# Patient Record
Sex: Female | Born: 1935 | Race: White | Hispanic: No | Smoking: Never smoker
Health system: Southern US, Community
[De-identification: ages and names within clinical notes are randomized; demographics above are authoritative.]

## PROBLEM LIST (undated history)

## (undated) DIAGNOSIS — R911 Solitary pulmonary nodule: Secondary | ICD-10-CM

## (undated) DIAGNOSIS — G479 Sleep disorder, unspecified: Secondary | ICD-10-CM

## (undated) DIAGNOSIS — C3432 Malignant neoplasm of lower lobe, left bronchus or lung: Secondary | ICD-10-CM

## (undated) DIAGNOSIS — K259 Gastric ulcer, unspecified as acute or chronic, without hemorrhage or perforation: Secondary | ICD-10-CM

## (undated) DIAGNOSIS — Z87442 Personal history of urinary calculi: Secondary | ICD-10-CM

## (undated) DIAGNOSIS — E7800 Pure hypercholesterolemia, unspecified: Secondary | ICD-10-CM

## (undated) DIAGNOSIS — I10 Essential (primary) hypertension: Secondary | ICD-10-CM

## (undated) DIAGNOSIS — D649 Anemia, unspecified: Secondary | ICD-10-CM

## (undated) DIAGNOSIS — K219 Gastro-esophageal reflux disease without esophagitis: Secondary | ICD-10-CM

## (undated) DIAGNOSIS — R5383 Other fatigue: Secondary | ICD-10-CM

## (undated) DIAGNOSIS — S3992XA Unspecified injury of lower back, initial encounter: Secondary | ICD-10-CM

## (undated) DIAGNOSIS — Z923 Personal history of irradiation: Secondary | ICD-10-CM

## (undated) DIAGNOSIS — D6481 Anemia due to antineoplastic chemotherapy: Secondary | ICD-10-CM

## (undated) DIAGNOSIS — C801 Malignant (primary) neoplasm, unspecified: Secondary | ICD-10-CM

## (undated) DIAGNOSIS — R200 Anesthesia of skin: Secondary | ICD-10-CM

## (undated) DIAGNOSIS — T451X5A Adverse effect of antineoplastic and immunosuppressive drugs, initial encounter: Secondary | ICD-10-CM

## (undated) DIAGNOSIS — I739 Peripheral vascular disease, unspecified: Secondary | ICD-10-CM

## (undated) DIAGNOSIS — Z85118 Personal history of other malignant neoplasm of bronchus and lung: Secondary | ICD-10-CM

## (undated) DIAGNOSIS — K5792 Diverticulitis of intestine, part unspecified, without perforation or abscess without bleeding: Secondary | ICD-10-CM

## (undated) DIAGNOSIS — M1990 Unspecified osteoarthritis, unspecified site: Secondary | ICD-10-CM

## (undated) DIAGNOSIS — E119 Type 2 diabetes mellitus without complications: Secondary | ICD-10-CM

## (undated) DIAGNOSIS — J90 Pleural effusion, not elsewhere classified: Secondary | ICD-10-CM

## (undated) HISTORY — DX: Pleural effusion, not elsewhere classified: J90

## (undated) HISTORY — DX: Malignant neoplasm of lower lobe, left bronchus or lung: C34.32

## (undated) HISTORY — DX: Personal history of irradiation: Z92.3

## (undated) HISTORY — PX: EYE SURGERY: SHX253

## (undated) HISTORY — DX: Anemia due to antineoplastic chemotherapy: D64.81

## (undated) HISTORY — PX: CHOLECYSTECTOMY: SHX55

## (undated) HISTORY — DX: Other fatigue: R53.83

## (undated) HISTORY — PX: BREAST BIOPSY: SHX20

## (undated) HISTORY — DX: Adverse effect of antineoplastic and immunosuppressive drugs, initial encounter: T45.1X5A

---
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
## 1970-09-12 HISTORY — PX: HERNIA REPAIR: SHX51

## 1977-09-12 HISTORY — PX: ABDOMINAL HYSTERECTOMY: SHX81

## 1995-09-13 HISTORY — PX: CARPAL TUNNEL RELEASE: SHX101

## 1996-09-12 DIAGNOSIS — S3992XA Unspecified injury of lower back, initial encounter: Secondary | ICD-10-CM

## 1996-09-12 HISTORY — DX: Unspecified injury of lower back, initial encounter: S39.92XA

## 2005-12-20 ENCOUNTER — Ambulatory Visit: Payer: Self-pay | Admitting: Internal Medicine

## 2006-03-01 ENCOUNTER — Ambulatory Visit: Payer: Self-pay | Admitting: Internal Medicine

## 2007-12-20 ENCOUNTER — Ambulatory Visit: Payer: Self-pay | Admitting: Family Medicine

## 2007-12-20 DIAGNOSIS — H6120 Impacted cerumen, unspecified ear: Secondary | ICD-10-CM

## 2010-04-20 ENCOUNTER — Encounter (INDEPENDENT_AMBULATORY_CARE_PROVIDER_SITE_OTHER): Payer: Self-pay | Admitting: *Deleted

## 2010-10-12 NOTE — Letter (Signed)
Summary: Nadara Eaton letter  [place] [facility]  [address] [place], [place] [address]   Phone: [phone]  Fax: [phone]       04/20/2010 MRN: [id number]  YVETTE LOVELESS [address] [place], [place]  [address]  Dear Ms. [facility] [place] and [place] announce the retirement of Arta Silence, M.D., from full-time practice at the [facility] office effective March 11, 2010 and his plans of returning part-time.  It is important to Dr. Hetty Ely and to our practice that you understand that [facility] has seven physicians in our office for your health care needs.  We will continue to offer the same exceptional care that you have today.    Dr. Hetty Ely has spoken to many of you about his plans for retirement and returning part-time in the fall.   We will continue to work with you through the transition to schedule appointments for you in the office and meet the high standards that [place] is committed to.   Again, it is with great pleasure that we share the news that Dr. Hetty Ely will return to [facility] in October of 2011 with a reduced schedule.    If you have any questions, or would like to request an appointment with one of our physicians, please call us at [phone] and press the option for Scheduling an appointment.  We take pleasure in providing you with excellent patient care and look forward to seeing you at your next office visit.  Our [facility] Physicians are:  Tillman Abide, M.D. Laurita Quint, M.D. Roxy Manns, M.D. Kerby Nora, M.D. Hannah Beat, M.D. Ruthe Mannan, M.D. We proudly welcomed Raechel Ache, M.D. and Eustaquio Boyden, M.D. to the practice in July/August 2011.  Sincerely,  [facility]

## 2012-09-12 DIAGNOSIS — C801 Malignant (primary) neoplasm, unspecified: Secondary | ICD-10-CM

## 2012-09-12 DIAGNOSIS — Z85118 Personal history of other malignant neoplasm of bronchus and lung: Secondary | ICD-10-CM

## 2012-09-12 HISTORY — DX: Malignant (primary) neoplasm, unspecified: C80.1

## 2012-09-12 HISTORY — DX: Personal history of other malignant neoplasm of bronchus and lung: Z85.118

## 2012-09-17 ENCOUNTER — Other Ambulatory Visit: Payer: Self-pay | Admitting: Orthopedic Surgery

## 2012-09-17 MED ORDER — BUPIVACAINE LIPOSOME 1.3 % IJ SUSP: 20.0000 mL | Freq: Once | INTRAMUSCULAR | Status: DC

## 2012-09-17 MED ORDER — DEXAMETHASONE SODIUM PHOSPHATE 10 MG/ML IJ SOLN: 10.0000 mg | Freq: Once | INTRAMUSCULAR | Status: DC

## 2012-09-17 NOTE — Progress Notes (Signed)
Preoperative surgical orders have been place into the Epic hospital system for Stephanie Fletcher on 09/17/2012, 11:14 AM  by Patrica Duel for surgery on 11/05/2012.  Preop Total Knee orders including Experal, IV Tylenol, and IV Decadron as long as there are no contraindications to the above medications. Avel Peace, PA-C

## 2012-10-23 ENCOUNTER — Encounter (HOSPITAL_COMMUNITY): Payer: Self-pay | Admitting: Pharmacy Technician

## 2012-10-23 ENCOUNTER — Other Ambulatory Visit: Payer: Self-pay | Admitting: Orthopedic Surgery
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
## 2012-10-29 ENCOUNTER — Encounter (HOSPITAL_COMMUNITY): Payer: Self-pay

## 2012-10-29 ENCOUNTER — Ambulatory Visit (HOSPITAL_COMMUNITY)
Admission: RE | Admit: 2012-10-29 | Discharge: 2012-10-29 | Disposition: A | Discharge status: Home / Self Care | Payer: Medicare Other | Source: Ambulatory Visit | Attending: Orthopedic Surgery | Admitting: Orthopedic Surgery

## 2012-10-29 ENCOUNTER — Encounter (HOSPITAL_COMMUNITY)
Admission: RE | Admit: 2012-10-29 | Discharge: 2012-10-29 | Disposition: A | Discharge status: Home / Self Care | Payer: Medicare Other | Source: Ambulatory Visit | Attending: Orthopedic Surgery | Admitting: Orthopedic Surgery

## 2012-10-29 DIAGNOSIS — Z01812 Encounter for preprocedural laboratory examination: Secondary | ICD-10-CM | POA: Insufficient documentation

## 2012-10-29 DIAGNOSIS — J984 Other disorders of lung: Secondary | ICD-10-CM | POA: Insufficient documentation

## 2012-10-29 DIAGNOSIS — Z01818 Encounter for other preprocedural examination: Secondary | ICD-10-CM | POA: Insufficient documentation

## 2012-10-29 DIAGNOSIS — K449 Diaphragmatic hernia without obstruction or gangrene: Secondary | ICD-10-CM | POA: Insufficient documentation

## 2012-10-29 HISTORY — DX: Unspecified injury of lower back, initial encounter: S39.92XA

## 2012-10-29 HISTORY — DX: Type 2 diabetes mellitus without complications: E11.9

## 2012-10-29 HISTORY — DX: Diverticulitis of intestine, part unspecified, without perforation or abscess without bleeding: K57.92

## 2012-10-29 HISTORY — DX: Anemia, unspecified: D64.9

## 2012-10-29 HISTORY — DX: Pure hypercholesterolemia, unspecified: E78.00

## 2012-10-29 HISTORY — DX: Unspecified osteoarthritis, unspecified site: M19.90

## 2012-10-29 HISTORY — DX: Essential (primary) hypertension: I10

## 2012-10-29 HISTORY — DX: Gastric ulcer, unspecified as acute or chronic, without hemorrhage or perforation: K25.9

## 2012-10-29 LAB — CBC
HCT: 36.7 % (ref 36.0–46.0)
MCH: 29.1 pg (ref 26.0–34.0)
MCHC: 31.9 g/dL (ref 30.0–36.0)
MCV: 91.3 fL (ref 78.0–100.0)
RDW: 12.4 % (ref 11.5–15.5)

## 2012-10-29 LAB — COMPREHENSIVE METABOLIC PANEL
Albumin: 3.3 g/dL — ABNORMAL LOW (ref 3.5–5.2)
BUN: 19 mg/dL (ref 6–23)
Chloride: 103 mEq/L (ref 96–112)
Creatinine, Ser: 0.88 mg/dL (ref 0.50–1.10)
Total Bilirubin: 0.1 mg/dL — ABNORMAL LOW (ref 0.3–1.2)

## 2012-10-29 LAB — URINALYSIS, ROUTINE W REFLEX MICROSCOPIC
Bilirubin Urine: NEGATIVE
Ketones, ur: NEGATIVE mg/dL
Nitrite: NEGATIVE
Specific Gravity, Urine: 1.016 (ref 1.005–1.030)
Urobilinogen, UA: 0.2 mg/dL (ref 0.0–1.0)

## 2012-10-29 LAB — SURGICAL PCR SCREEN: MRSA, PCR: NEGATIVE

## 2012-10-29 LAB — PROTIME-INR
INR: 0.95 (ref 0.00–1.49)
Prothrombin Time: 12.6 seconds (ref 11.6–15.2)

## 2012-10-29 NOTE — Progress Notes (Signed)
Abnormal CXR routed to Dr. Lequita Halt via [facility]

## 2012-10-29 NOTE — Patient Instructions (Signed)
20 Haelyn Forgey  10/29/2012   Your procedure is scheduled on: 11/05/12  Report to [facility] at 0730 AM.  Call this number if you have problems the morning of surgery [phone]: [phone]   Remember:   Do not eat food or drink liquids After Midnight.     Take these medicines the morning of surgery with A SIP OF WATER: prilosec   Do not wear jewelry, make-up or nail polish.  Do not wear lotions, powders, or perfumes. You may wear deodorant.  Do not shave 48 hours prior to surgery. Men may shave face and neck.  Do not bring valuables to the hospital.  Contacts, dentures or bridgework may not be worn into surgery.  Leave suitcase in the car. After surgery it may be brought to your room.  For patients admitted to the hospital, checkout time is 11:00 AM the day of discharge.     Please read over the following fact sheets that you were given: MRSA Information.  Birdie Sons, RN  pre op nurse call if needed [phone]    FAILURE TO FOLLOW THESE INSTRUCTIONS MAY RESULT IN CANCELLATION OF YOUR SURGERY   Patient Signature: ___________________________________________

## 2012-10-29 NOTE — Progress Notes (Signed)
EKG 09/07/12 on chart, office visit note Dr. Hyacinth Meeker 09/07/12 on chart, surgery clearance note Dr. Hyacinth Meeker on chart

## 2012-10-30 ENCOUNTER — Other Ambulatory Visit: Payer: Self-pay | Admitting: Orthopedic Surgery

## 2012-11-01 ENCOUNTER — Ambulatory Visit
Admission: RE | Admit: 2012-11-01 | Discharge: 2012-11-01 | Disposition: A | Discharge status: Home / Self Care | Payer: Medicare Other | Source: Ambulatory Visit | Attending: Orthopedic Surgery | Admitting: Orthopedic Surgery

## 2012-11-05 ENCOUNTER — Encounter (HOSPITAL_COMMUNITY): Admission: RE | Payer: Self-pay | Source: Ambulatory Visit

## 2012-11-05 ENCOUNTER — Inpatient Hospital Stay (HOSPITAL_COMMUNITY): Admission: RE | Admit: 2012-11-05 | Payer: Medicare Other | Source: Ambulatory Visit | Admitting: Orthopedic Surgery

## 2012-11-05 LAB — TYPE AND SCREEN: Antibody Screen: NEGATIVE

## 2012-11-05 SURGERY — ARTHROPLASTY, KNEE, TOTAL
Anesthesia: Choice | Site: Knee | Laterality: Left

## 2012-11-07 ENCOUNTER — Other Ambulatory Visit (HOSPITAL_COMMUNITY): Payer: Self-pay | Admitting: Orthopedic Surgery

## 2012-11-07 DIAGNOSIS — R918 Other nonspecific abnormal finding of lung field: Secondary | ICD-10-CM

## 2012-11-09 ENCOUNTER — Ambulatory Visit (HOSPITAL_COMMUNITY)
Admission: RE | Admit: 2012-11-09 | Discharge: 2012-11-09 | Disposition: A | Discharge status: Home / Self Care | Payer: Medicare Other | Source: Ambulatory Visit | Attending: Orthopedic Surgery | Admitting: Orthopedic Surgery

## 2012-11-09 DIAGNOSIS — R222 Localized swelling, mass and lump, trunk: Secondary | ICD-10-CM | POA: Insufficient documentation

## 2012-11-09 LAB — PULMONARY FUNCTION TEST

## 2012-11-09 MED ORDER — ALBUTEROL SULFATE (5 MG/ML) 0.5% IN NEBU
2.5000 mg | INHALATION_SOLUTION | Freq: Once | RESPIRATORY_TRACT | Status: AC
  Administered 2012-11-09: 2.5 mg via RESPIRATORY_TRACT

## 2012-11-13 ENCOUNTER — Other Ambulatory Visit: Payer: Self-pay

## 2012-11-13 ENCOUNTER — Ambulatory Visit (INDEPENDENT_AMBULATORY_CARE_PROVIDER_SITE_OTHER): Payer: Medicare Other | Admitting: Thoracic Surgery (Cardiothoracic Vascular Surgery)

## 2012-11-13 ENCOUNTER — Encounter: Payer: Self-pay | Admitting: Thoracic Surgery (Cardiothoracic Vascular Surgery)

## 2012-11-13 ENCOUNTER — Ambulatory Visit (HOSPITAL_COMMUNITY)
Admission: RE | Admit: 2012-11-13 | Discharge: 2012-11-13 | Disposition: A | Discharge status: Home / Self Care | Payer: Medicare Other | Source: Ambulatory Visit | Attending: Orthopedic Surgery | Admitting: Orthopedic Surgery

## 2012-11-13 VITALS — BP 157/68 | HR 69 | Resp 16 | Ht 66.0 in | Wt 143.0 lb

## 2012-11-13 DIAGNOSIS — K449 Diaphragmatic hernia without obstruction or gangrene: Secondary | ICD-10-CM | POA: Insufficient documentation

## 2012-11-13 DIAGNOSIS — R222 Localized swelling, mass and lump, trunk: Secondary | ICD-10-CM | POA: Insufficient documentation

## 2012-11-13 DIAGNOSIS — D3500 Benign neoplasm of unspecified adrenal gland: Secondary | ICD-10-CM | POA: Insufficient documentation

## 2012-11-13 LAB — GLUCOSE, CAPILLARY: Glucose-Capillary: 146 mg/dL — ABNORMAL HIGH (ref 70–99)

## 2012-11-13 MED ORDER — FLUDEOXYGLUCOSE F - 18 (FDG) INJECTION
15.4000 | Freq: Once | INTRAVENOUS | Status: AC | PRN
  Administered 2012-11-13: 15.4 via INTRAVENOUS

## 2012-11-13 NOTE — Progress Notes (Signed)
PCP is Yetta Flock, MD Referring Provider is Aluisio, Gus Rankin, MD  Chief Complaint  Patient presents with  . Lung Mass    LLLobe...eval and treat...CT CHEST, PET, PFT'S    HPI: Stephanie Fletcher is an 79 year old woman who presented with a chief complaint of a lung mass.  Stephanie Fletcher is an 62 year old lady who was being evaluated for a total knee replacement. As part of her preoperative evaluation she had a chest x-ray which showed a left lower lobe mass. This led to a CT of the chest which showed a left lower lobe mass with no mediastinal or hilar adenopathy. Today she had a PET/CT which showed the lesion to be hypermetabolic, albeit with an SUV of 4.9. There was no hilar or mediastinal adenopathy. She does have bilateral adrenal lesions consistent with adenomas.  She is a lifelong nonsmoker, although she did have exposure to secondhand smoke from her husband. She has no other exposures to speak of. Interestingly her husband had nocardia in 2006. She has no TB or fungal exposure. She does not keep birds and has not traveled recently. 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 She denies any recent fevers chills or sweats. Her most recent chest x-ray is about 15 years ago in [place].   Past Medical History  Diagnosis Date  . Measles   . Mumps   . Diabetes mellitus without complication     type 2  . Hypercholesterolemia   . Hypertension   . Diverticulitis   . Gastric ulcer   . Arthritis   . Anemia   . Back injury 1998    T-12 fracture (brace)    Past Surgical History  Procedure Laterality Date  . Abdominal hysterectomy  1979  . Hernia repair Right 1972  . Eye surgery Bilateral 2007, 2008    cataracts  . Appendectomy  1949  . Breast biopsy Bilateral [id number]  . Carpal tunnel release Bilateral 1997  . Skin graft  1955    right groin  . Cholecystectomy      No family history on file. Family history  Father are disease high blood  pressure, mother high blood pressure and stroke, brother high cholesterol and high blood pressure  Social History History  Substance Use Topics  . Smoking status: Never Smoker   . Smokeless tobacco: Never Used  . Alcohol Use: No    Current Outpatient Prescriptions  Medication Sig Dispense Refill  . Ascorbic Acid (VITAMIN C) 1000 MG tablet Take 1,000 mg by mouth daily.      [name] aspirin EC 81 MG tablet Take 81 mg by mouth at bedtime.      . Calcium Carbonate (CALCIUM 600 PO) Take 600 mg by mouth 2 (two) times daily.      . cholecalciferol (VITAMIN D) 1000 UNITS tablet Take 1,000 Units by mouth daily.      . cyanocobalamin (,VITAMIN B-12,) 1000 MCG/ML injection Inject 1,000 mcg into the muscle every 30 (thirty) days.      [name] dicyclomine (BENTYL) 20 MG tablet Take 20 mg by mouth every 6 (six) hours as needed (for stomach).      . Ferrous Sulfate (FEROSUL PO) Take 220 mg by mouth daily.      . fish oil-omega-3 fatty acids 1000 MG capsule Take 1 g by mouth daily.      . metFORMIN (GLUCOPHAGE) 500 MG tablet Take 1,000 mg by mouth 2 (two) times daily with a meal.      .  Multiple Vitamin (MULTIVITAMIN WITH MINERALS) TABS Take 1 tablet by mouth daily.      . Multiple Vitamins-Minerals (PRESERVISION AREDS 2 PO) Take 2 mg by mouth 2 (two) times daily.      [name] omeprazole (PRILOSEC) 20 MG capsule Take 20 mg by mouth 2 (two) times daily.      . pioglitazone (ACTOS) 15 MG tablet Take 15 mg by mouth every morning.       . polyethylene glycol (MIRALAX / GLYCOLAX) packet Take 17 g by mouth daily as needed (constipation).      . pravastatin (PRAVACHOL) 20 MG tablet Take 20 mg by mouth at bedtime.      . sitaGLIPtin (JANUVIA) 100 MG tablet Take 100 mg by mouth daily.      . valsartan-hydrochlorothiazide (DIOVAN HCT) 160-25 MG per tablet Take 1 tablet by mouth every morning.      . zolpidem (AMBIEN) 5 MG tablet Take 5 mg by mouth at bedtime as needed for sleep.       No current facility-administered medications  for this visit.    Allergies  Allergen Reactions  . Fosamax (Alendronate)     Review of Systems  Constitutional: Positive for activity change (Limited due to decreased energy and knee problems), fatigue and unexpected weight change (Lost 7 pounds over past 6). Negative for fever and chills.  Eyes: Positive for visual disturbance (Eyesight is worsened recently).  Respiratory: Negative for cough, chest tightness, shortness of breath and wheezing.   Cardiovascular: Negative for chest pain.  Musculoskeletal: Positive for joint swelling and arthralgias (knee pain, being evaluated for total knee replacement).  Neurological:       Neuropathy in legs  All other systems reviewed and are negative.    BP 157/68  Pulse 69  Resp 16  Ht 5\' 6"  (1.676 m)  Wt 143 lb (64.864 kg)  BMI 23.09 kg/m2  SpO2 97% Physical Exam  Vitals reviewed. Constitutional: She is oriented to person, place, and time. She appears well-developed and well-nourished. No distress.  Elderly  HENT:  Head: Normocephalic and atraumatic.  Eyes: EOM are normal. Pupils are equal, round, and reactive to light.  Wearing glasses  Neck: Neck supple. No thyromegaly present.  Cardiovascular: Normal rate, regular rhythm and normal heart sounds.   No murmur heard. Pulmonary/Chest: Effort normal and breath sounds normal. She has no wheezes. She has no rales.  Abdominal: Soft. There is no tenderness.  Musculoskeletal: She exhibits no edema.  Lymphadenopathy:    She has no cervical adenopathy.  Neurological: She is alert and oriented to person, place, and time. No cranial nerve deficit.  Skin: Skin is warm and dry.  Psychiatric: She has a normal mood and affect.     Diagnostic Tests: CT of chest 11/01/12 CT CHEST WITHOUT CONTRAST  Technique: Multidetector CT imaging of the chest was performed  following the standard protocol without IV contrast.  Comparison: Chest x-ray 10/29/2012.  Findings:  Mediastinum: Heart size is  upper limits of normal. There is no  significant pericardial fluid, thickening or pericardial  calcification. There is atherosclerosis of the thoracic aorta, the  great vessels of the mediastinum and the coronary arteries,  including calcified atherosclerotic plaque in the left main, left  anterior descending and left circumflex coronary arteries. No  pathologically enlarged mediastinal or hilar lymph nodes. Please  note that accurate exclusion of hilar adenopathy is limited on  noncontrast CT scans. Large hiatal hernia.  Lungs/Pleura: There is a large pleural-based mass in the inferior  aspect of the left lower lobe which measures approximately 5.2 x  2.9 cm (image 36 of series 4). There is a small amount of ground-  glass attenuation adjacent to this lesion in the periphery of the  left lower lobe, likely reflects some mild postobstructive changes.  One tiny calcification is noted along the anterior aspect of this  lesion on image 35 of series 3, however, this is not a specific  finding. 3 mm nodule in the left lower lobe (image 27 of series  4). No pleural effusions. No acute consolidative airspace disease.  Upper Abdomen: 1.6 cm low attenuation lesion extending  exophytically from the upper pole of the left kidney and 1.1 cm low  attenuation lesion in the anterior aspect of the lower pole left  kidney are incompletely characterized on this noncontrast CT  examination. 1-2 mm calcifications in the upper pole collecting  system of the left kidney may be nonobstructive calculi and/or  vascular calcifications. Extensive atherosclerosis is noted  throughout the upper abdominal vasculature. Status post  cholecystectomy.  Musculoskeletal: There are no aggressive appearing lytic or blastic  lesions noted in the visualized portions of the skeleton.  Compression fracture of L1 with approximately 50% loss of anterior  vertebral body height and 10% loss of posterior vertebral body  height with  mild buckling of the posterior cortex.  IMPRESSION:  1. 5.2 x 2.9 cm left lower lobe mass abutting the left major  fissure. There is a 3 mm nodule in the left lower lobe as well  which is nonspecific. No definite lymphadenopathy in the  mediastinum or hilum on this limited noncontrast CT examination.  This is highly suspicious for a primary bronchogenic neoplasm, and  correlation with PET CT and/or biopsy is highly recommended.  2. Atherosclerosis, including left main and two-vessel coronary  artery disease.  3. Large hiatal hernia.  4. Additional incidental findings, as above.   PET/CT 11/13/12 *RADIOLOGY REPORT*  Clinical Data: Initial treatment strategy for lung mass.  NUCLEAR MEDICINE PET SKULL BASE TO THIGH  Fasting Blood Glucose: 146  Technique: 15.4 mCi F-18 FDG was injected intravenously. CT data  was obtained and used for attenuation correction and anatomic  localization only. (This was not acquired as a diagnostic CT  examination.) Additional exam technical data entered on  technologist worksheet.  Comparison: CT chest dated 11/01/2012  Findings:  Neck: No hypermetabolic lymph nodes in the neck.  Chest: Evaluation is constrained by respiratory motion.  Mass in the left lower lobe along the fissure measures at least 3.9  x 2.8 cm (series 2/image 99), with max SUV 4.9 (PET image at 88),  compatible with primary bronchogenic neoplasm.  Additional 3 mm left lower lobe pulmonary nodule noted on prior CT  is not evident on the current study. Regardless, this would be  below the threshold for PET sensitivity.  No hypermetabolic mediastinal or hilar nodes.  Abdomen/Pelvis: No abnormal hypermetabolic activity within the  liver, pancreas, or spleen.  1.4 x 1.4 cm right adrenal nodule (series 2/image 109) and 1.9 x  1.1 cm left renal nodule (series 2/image 111), measuring less than  10 HUs on unenhanced CT and without corresponding hypermetabolism  on PET, reflecting adrenal  adenomas.  No hypermetabolic lymph nodes in the abdomen or pelvis.  Moderate hiatal hernia.  Scattered areas of GI uptake without corresponding suspicious focal  abnormality on CT.  Skeleton: No focal hypermetabolic activity to suggest skeletal  metastasis.  IMPRESSION:  Left lower lobe mass measuring  at least 3.9 cm, max SUV 4.9,  compatible with primary bronchogenic neoplasm.  Bilateral adrenal adenomas, as described above.  No evidence of metastatic disease.   Impression: Stephanie Fletcher is an 62 year old woman who is being evaluated for elective total knee replacement. During her preoperative evaluation she was found to have a left lower lobe mass. This was confirmed by CT scan and was a 5.2 x 2.9 cm mass in the left lower lobe abutting the major fissure. She then had a PET/CT today which showed the lesion to be hypermetabolic with a maximum SUV of 4.9.  She does not have any recent past x-rays for comparison. Interestingly the lesion measured a little smaller on the PET CT than it did on the CT 3 weeks ago. Another interesting finding is that her husband had a Nocardia infection in 2006. The lesion is hypermetabolic, although the SUV is not particularly high given the size of the lesion. I cannot rule out that this is a primary bronchogenic carcinoma. However I do think it's within the relative possibility that this represents an infectious or inflammatory mass. Given that possibility I think the best initial approach would be to proceed with electromagnetic navigational bronchoscopy system we can obtain biopsies and specimens for cultures. I discussed this in detail with the patient as well as her sister-in-law who accompanied her. We talked about the need for general anesthesia, the general nature of the procedure, and possible outcomes. They do understand there is a possibility of false negative findings with this procedure. We will plan to do it on an outpatient basis. They understand the  procedure will be done with a flexible bronchoscope via endotracheal tube. We discussed the risks of the procedure which include those related general anesthesia including death, MI, stroke, blood clots, as well as the risks of a false negative or nondiagnostic study, bleeding and pneumothorax. They do understand that additional diagnostic or therapeutic procedures may be needed.  She understands and accepts the risk and wishes to proceed   Plan: Bronchoscopy/electromagnetic navigational bronchoscopy for biopsies and cultures on Thursday, March 13.

## 2012-11-15 ENCOUNTER — Encounter (HOSPITAL_COMMUNITY): Payer: Self-pay

## 2012-11-20 ENCOUNTER — Encounter (HOSPITAL_COMMUNITY)
Admission: RE | Admit: 2012-11-20 | Discharge: 2012-11-20 | Disposition: A | Discharge status: Home / Self Care | Payer: Medicare Other | Source: Ambulatory Visit | Attending: Thoracic Surgery (Cardiothoracic Vascular Surgery) | Admitting: Thoracic Surgery (Cardiothoracic Vascular Surgery)

## 2012-11-20 ENCOUNTER — Encounter (HOSPITAL_COMMUNITY): Payer: Self-pay

## 2012-11-20 VITALS — BP 157/75 | HR 74 | Temp 98.1°F | Resp 20 | Ht 66.0 in | Wt 147.3 lb

## 2012-11-20 HISTORY — DX: Solitary pulmonary nodule: R91.1

## 2012-11-20 HISTORY — DX: Gastro-esophageal reflux disease without esophagitis: K21.9

## 2012-11-20 LAB — COMPREHENSIVE METABOLIC PANEL
Alkaline Phosphatase: 57 U/L (ref 39–117)
BUN: 16 mg/dL (ref 6–23)
CO2: 27 mEq/L (ref 19–32)
Chloride: 102 mEq/L (ref 96–112)
GFR calc Af Amer: 90 mL/min — ABNORMAL LOW (ref >90)
Glucose, Bld: 202 mg/dL — ABNORMAL HIGH (ref 70–99)
Potassium: 4.3 mEq/L (ref 3.5–5.1)
Total Bilirubin: 0.2 mg/dL — ABNORMAL LOW (ref 0.3–1.2)

## 2012-11-20 LAB — CBC
HCT: 36.2 % (ref 36.0–46.0)
Hemoglobin: 11.7 g/dL — ABNORMAL LOW (ref 12.0–15.0)
RBC: 4 MIL/uL (ref 3.87–5.11)
WBC: 6.2 10*3/uL (ref 4.0–10.5)

## 2012-11-20 LAB — APTT: aPTT: 33 seconds (ref 24–37)

## 2012-11-20 LAB — PROTIME-INR: Prothrombin Time: 12.9 seconds (ref 11.6–15.2)

## 2012-11-20 NOTE — Pre-Procedure Instructions (Signed)
Stephanie Fletcher  11/20/2012   Your procedure is scheduled on:  Thursday March 13  Report to [facility] at 0530 AM.  Call this number if you have problems the morning of surgery: [phone]   Remember:   Do not eat food or drink liquids after midnight. Wednesday night   Take these medicines the morning of surgery with A SIP OF WATER: Prilosec    Do not wear jewelry, make-up or nail polish.  Do not wear lotions, powders, or perfumes or  deodorant.  Do not shave your legs or underarms 48 hours prior to surgery.               Do not bring valuables to the hospital.  Contacts, dentures or bridgework may not be worn into surgery.     Patients discharged the day of surgery will not be allowed to drive  home.  Name and phone number of your driver:   Special Instructions: Shower using CHG 2 nights before surgery and the night before surgery.  If you shower the day of surgery use CHG.  Use special wash - you have one bottle of CHG for all showers.  You should use approximately 1/3 of the bottle for each shower.   Please read over the following fact sheets that you were given: Pain Booklet, Coughing and Deep Breathing and Surgical Site Infection Prevention

## 2012-11-21 MED ORDER — CEFAZOLIN SODIUM-DEXTROSE 2-3 GM-% IV SOLR
2.0000 g | INTRAVENOUS | Status: AC
  Administered 2012-11-22: 2 g via INTRAVENOUS
  Filled 2012-11-21: qty 50

## 2012-11-21 MED ORDER — SODIUM CHLORIDE 0.9 % IV SOLN: INTRAVENOUS | Status: DC

## 2012-11-21 MED ORDER — ACETAMINOPHEN 10 MG/ML IV SOLN
1000.0000 mg | Freq: Once | INTRAVENOUS | Status: DC
  Filled 2012-11-21: qty 100

## 2012-11-21 NOTE — Progress Notes (Signed)
Spoke with Steward Drone at [place] [facility] in [place]. She will fax most recent copy of EKG to [phone].

## 2012-11-21 NOTE — Progress Notes (Signed)
Anesthesia Chart Review:  Patient is a 79 year old female scheduled for bronchoscopy, ENB by Dr. Dorris Fetch on 11/22/12 for further evaluation of a LLL lung mass that was found incidentally during her pre-operative evaluation for a left TKA.  Other history includes DM2, HTN,  hypercholesterolemia, diverticulitis, gastric ulcer, arthritis, anemia, T12 fracture due to injury '98, measles, mumps, GERD.  PCP is Dr. Silver Huguenin at [facility]).  EKG from [facility] on 08/28/12 showed SR, PVCs, cannot rule out inferior infarct, right BBB.  This was reviewed by Dr. Hyacinth Meeker during patient's pre-operative clearance visit.  Dr. Hyacinth Meeker did not feel the EKG was changed from her prior EKG, and patient was ultimately cleared for left TKA.   Preoperative labs and CXR noted.  Patient's EKG was previously reviewed by her PCP and felt stable. If no acute CV symptoms then would anticipate she could proceed as planned.  She will be evaluated by her assigned anesthesiologist on the day of surgery.  Velna Ochs [facility]/Anesthesiology Phone [phone] 11/21/2012 1:42 PM

## 2012-11-21 NOTE — Progress Notes (Signed)
EKG received from [place] [facility].  Right Bundle Branch Block noted on EKG.  Revonda Standard, PA notified.  States she will check the chart.

## 2012-11-22 ENCOUNTER — Ambulatory Visit (HOSPITAL_COMMUNITY): Payer: Medicare Other | Admitting: Certified Registered Nurse Anesthetist

## 2012-11-22 ENCOUNTER — Ambulatory Visit (HOSPITAL_COMMUNITY): Payer: Medicare Other

## 2012-11-22 ENCOUNTER — Encounter (HOSPITAL_COMMUNITY): Payer: Self-pay | Admitting: Vascular Surgery

## 2012-11-22 ENCOUNTER — Ambulatory Visit (HOSPITAL_COMMUNITY)
Admission: RE | Admit: 2012-11-22 | Discharge: 2012-11-22 | Disposition: A | Discharge status: Home / Self Care | Payer: Medicare Other | Source: Ambulatory Visit | Attending: Thoracic Surgery (Cardiothoracic Vascular Surgery) | Admitting: Thoracic Surgery (Cardiothoracic Vascular Surgery)

## 2012-11-22 ENCOUNTER — Encounter (HOSPITAL_COMMUNITY): Payer: Self-pay | Admitting: Anesthesiology

## 2012-11-22 ENCOUNTER — Encounter (HOSPITAL_COMMUNITY)
Admission: RE | Disposition: A | Discharge status: Home / Self Care | Payer: Self-pay | Source: Ambulatory Visit | Attending: Thoracic Surgery (Cardiothoracic Vascular Surgery)

## 2012-11-22 DIAGNOSIS — D649 Anemia, unspecified: Secondary | ICD-10-CM | POA: Insufficient documentation

## 2012-11-22 DIAGNOSIS — Z888 Allergy status to other drugs, medicaments and biological substances status: Secondary | ICD-10-CM | POA: Insufficient documentation

## 2012-11-22 DIAGNOSIS — Z79899 Other long term (current) drug therapy: Secondary | ICD-10-CM | POA: Insufficient documentation

## 2012-11-22 DIAGNOSIS — M129 Arthropathy, unspecified: Secondary | ICD-10-CM | POA: Insufficient documentation

## 2012-11-22 DIAGNOSIS — E7800 Pure hypercholesterolemia, unspecified: Secondary | ICD-10-CM | POA: Insufficient documentation

## 2012-11-22 DIAGNOSIS — I10 Essential (primary) hypertension: Secondary | ICD-10-CM | POA: Insufficient documentation

## 2012-11-22 DIAGNOSIS — C3430 Malignant neoplasm of lower lobe, unspecified bronchus or lung: Secondary | ICD-10-CM | POA: Insufficient documentation

## 2012-11-22 DIAGNOSIS — D381 Neoplasm of uncertain behavior of trachea, bronchus and lung: Secondary | ICD-10-CM

## 2012-11-22 DIAGNOSIS — E119 Type 2 diabetes mellitus without complications: Secondary | ICD-10-CM | POA: Insufficient documentation

## 2012-11-22 DIAGNOSIS — D3500 Benign neoplasm of unspecified adrenal gland: Secondary | ICD-10-CM | POA: Insufficient documentation

## 2012-11-22 DIAGNOSIS — Z8711 Personal history of peptic ulcer disease: Secondary | ICD-10-CM | POA: Insufficient documentation

## 2012-11-22 DIAGNOSIS — Z7982 Long term (current) use of aspirin: Secondary | ICD-10-CM | POA: Insufficient documentation

## 2012-11-22 DIAGNOSIS — G609 Hereditary and idiopathic neuropathy, unspecified: Secondary | ICD-10-CM | POA: Insufficient documentation

## 2012-11-22 HISTORY — PX: VIDEO BRONCHOSCOPY WITH ENDOBRONCHIAL NAVIGATION: SHX6175

## 2012-11-22 LAB — GLUCOSE, CAPILLARY: Glucose-Capillary: 120 mg/dL — ABNORMAL HIGH (ref 70–99)

## 2012-11-22 SURGERY — VIDEO BRONCHOSCOPY WITH ENDOBRONCHIAL NAVIGATION
Anesthesia: General | Site: Bronchus | Wound class: Clean Contaminated

## 2012-11-22 MED ORDER — ROCURONIUM BROMIDE 100 MG/10ML IV SOLN
INTRAVENOUS | Status: DC | PRN
  Administered 2012-11-22: 10 mg via INTRAVENOUS
  Administered 2012-11-22: 20 mg via INTRAVENOUS

## 2012-11-22 MED ORDER — LIDOCAINE HCL (CARDIAC) 20 MG/ML IV SOLN
INTRAVENOUS | Status: DC | PRN
  Administered 2012-11-22: 80 mg via INTRAVENOUS

## 2012-11-22 MED ORDER — PHENYLEPHRINE HCL 10 MG/ML IJ SOLN
10.0000 mg | INTRAMUSCULAR | Status: DC | PRN
  Administered 2012-11-22: 50 ug/min via INTRAVENOUS

## 2012-11-22 MED ORDER — ONDANSETRON HCL 4 MG/2ML IJ SOLN
INTRAMUSCULAR | Status: DC | PRN
  Administered 2012-11-22: 4 mg via INTRAVENOUS

## 2012-11-22 MED ORDER — PHENYLEPHRINE HCL 10 MG/ML IJ SOLN
INTRAMUSCULAR | Status: DC | PRN
  Administered 2012-11-22: 80 ug via INTRAVENOUS
  Administered 2012-11-22: 40 ug via INTRAVENOUS

## 2012-11-22 MED ORDER — 0.9 % SODIUM CHLORIDE (POUR BTL) OPTIME
TOPICAL | Status: DC | PRN
  Administered 2012-11-22: 500 mL

## 2012-11-22 MED ORDER — LIDOCAINE HCL 4 % MT SOLN
OROMUCOSAL | Status: DC | PRN
  Administered 2012-11-22: 4 mL via TOPICAL

## 2012-11-22 MED ORDER — PROPOFOL 10 MG/ML IV BOLUS
INTRAVENOUS | Status: DC | PRN
  Administered 2012-11-22: 30 mg via INTRAVENOUS
  Administered 2012-11-22: 100 mg via INTRAVENOUS
  Administered 2012-11-22: 50 mg via INTRAVENOUS

## 2012-11-22 MED ORDER — EPHEDRINE SULFATE 50 MG/ML IJ SOLN
INTRAMUSCULAR | Status: DC | PRN
  Administered 2012-11-22: 10 mg via INTRAVENOUS

## 2012-11-22 MED ORDER — ONDANSETRON HCL 4 MG/2ML IJ SOLN: 4.0000 mg | Freq: Once | INTRAMUSCULAR | Status: DC | PRN

## 2012-11-22 MED ORDER — FENTANYL CITRATE 0.05 MG/ML IJ SOLN
INTRAMUSCULAR | Status: DC | PRN
  Administered 2012-11-22 (×2): 50 ug via INTRAVENOUS

## 2012-11-22 MED ORDER — SUCCINYLCHOLINE CHLORIDE 20 MG/ML IJ SOLN
INTRAMUSCULAR | Status: DC | PRN
  Administered 2012-11-22: 80 mg via INTRAVENOUS

## 2012-11-22 MED ORDER — GLYCOPYRROLATE 0.2 MG/ML IJ SOLN
INTRAMUSCULAR | Status: DC | PRN
  Administered 2012-11-22: 0.1 mg via INTRAVENOUS
  Administered 2012-11-22: 0.4 mg via INTRAVENOUS

## 2012-11-22 MED ORDER — HYDROMORPHONE HCL PF 1 MG/ML IJ SOLN: 0.2500 mg | INTRAMUSCULAR | Status: DC | PRN

## 2012-11-22 MED ORDER — NEOSTIGMINE METHYLSULFATE 1 MG/ML IJ SOLN
INTRAMUSCULAR | Status: DC | PRN
  Administered 2012-11-22: 3 mg via INTRAVENOUS

## 2012-11-22 MED ORDER — LACTATED RINGERS IV SOLN
INTRAVENOUS | Status: DC | PRN
  Administered 2012-11-22 (×2): via INTRAVENOUS

## 2012-11-22 SURGICAL SUPPLY — 32 items
BRUSH SUPERTRAX BIOPSY (INSTRUMENTS) IMPLANT
BRUSH SUPERTRAX NDL-TIP CYTO (INSTRUMENTS) ×2 IMPLANT
CANISTER SUCTION 2500CC (MISCELLANEOUS) ×2 IMPLANT
CHANNEL WORK EXTEND EDGE 180 (KITS) IMPLANT
CHANNEL WORK EXTEND EDGE 45 (KITS) IMPLANT
CHANNEL WORK EXTEND EDGE 90 (KITS) IMPLANT
CLOTH BEACON ORANGE TIMEOUT ST (SAFETY) ×2 IMPLANT
CONT SPEC 4OZ CLIKSEAL STRL BL (MISCELLANEOUS) ×4 IMPLANT
COVER TABLE BACK 60X90 (DRAPES) ×2 IMPLANT
FILTER STRAW FLUID ASPIR (MISCELLANEOUS) IMPLANT
FORCEPS BIOP SUPERTRX PREMAR (INSTRUMENTS) ×2 IMPLANT
GLOVE SURG SIGNA 7.5 PF LTX (GLOVE) ×2 IMPLANT
GOWN PREVENTION PLUS XLARGE (GOWN DISPOSABLE) ×2 IMPLANT
KIT LOCATABLE GUIDE (CANNULA) IMPLANT
KIT MARKER FIDUCIAL DELIVERY (KITS) IMPLANT
KIT PROCEDURE EDGE 180 (KITS) IMPLANT
KIT PROCEDURE EDGE 45 (KITS) IMPLANT
KIT PROCEDURE EDGE 90 (KITS) ×2 IMPLANT
KIT ROOM TURNOVER OR (KITS) ×2 IMPLANT
MARKER SKIN DUAL TIP RULER LAB (MISCELLANEOUS) ×2 IMPLANT
NEEDLE SUPERTRX PREMARK BIOPSY (NEEDLE) ×2 IMPLANT
NS IRRIG 1000ML POUR BTL (IV SOLUTION) ×2 IMPLANT
OIL SILICONE PENTAX (PARTS (SERVICE/REPAIRS)) ×2 IMPLANT
PAD ARMBOARD 7.5X6 YLW CONV (MISCELLANEOUS) ×4 IMPLANT
PATCHES PATIENT (LABEL) ×2 IMPLANT
SPONGE GAUZE 4X4 12PLY (GAUZE/BANDAGES/DRESSINGS) ×2 IMPLANT
SYR 20ML ECCENTRIC (SYRINGE) ×2 IMPLANT
SYR 30ML LL (SYRINGE) ×2 IMPLANT
SYR 5ML LL (SYRINGE) ×2 IMPLANT
TOWEL OR 17X24 6PK STRL BLUE (TOWEL DISPOSABLE) ×2 IMPLANT
TRAP SPECIMEN MUCOUS 40CC (MISCELLANEOUS) ×2 IMPLANT
TUBE CONNECTING 12X1/4 (SUCTIONS) ×2 IMPLANT

## 2012-11-22 NOTE — Progress Notes (Signed)
Pt unable to get rings off

## 2012-11-22 NOTE — Brief Op Note (Addendum)
11/22/2012  9:44 AM  PATIENT:  Stephanie Fletcher  79 y.o. female  PRE-OPERATIVE DIAGNOSIS:  lung mass left lower lobe  POST-OPERATIVE DIAGNOSIS:  lung mass left lower lobe  PROCEDURE:  Procedure(s): VIDEO BRONCHOSCOPY WITH ENDOBRONCHIAL NAVIGATION (N/A) brushings, biopsies, needle aspirations and BAL  SURGEON:  Surgeon(s) and Role:    * Loreli Slot, MD - Primary  ANESTHESIA:   general  EBL:  Total I/O In: 1000 [I.V.:1000] Out: -    LOCAL MEDICATIONS USED:  NONE  SPECIMEN:  Source of Specimen:  LLL mass  DISPOSITION OF SPECIMEN:  Pathology and micro  PLAN OF CARE: Discharge to home after PACU  PATIENT DISPOSITION:  PACU - hemodynamically stable.   Delay start of Pharmacological VTE agent (>24hrs) due to surgical blood loss or risk of bleeding: not applicable  Quick prep of brushings- + NSCCA

## 2012-11-22 NOTE — Anesthesia Postprocedure Evaluation (Signed)
  Anesthesia Post-op Note  Patient: Stephanie Fletcher  Procedure(s) Performed: Procedure(s): VIDEO BRONCHOSCOPY WITH ENDOBRONCHIAL NAVIGATION (N/A)  Patient Location: PACU  Anesthesia Type:General  Level of Consciousness: awake, oriented and patient cooperative  Airway and Oxygen Therapy: Patient Spontanous Breathing  Post-op Pain: none  Post-op Assessment: Post-op Vital signs reviewed, Patient's Cardiovascular Status Stable, Respiratory Function Stable, Patent Airway, No signs of Nausea or vomiting and Pain level controlled  Post-op Vital Signs: stable  Complications: No apparent anesthesia complications

## 2012-11-22 NOTE — Anesthesia Procedure Notes (Signed)
Procedure Name: Intubation Date/Time: 11/22/2012 8:15 AM Performed by: Margaree Mackintosh Pre-anesthesia Checklist: Patient identified, Emergency Drugs available, Suction available, Patient being monitored and Timeout performed Patient Re-evaluated:Patient Re-evaluated prior to inductionOxygen Delivery Method: Circle system utilized Preoxygenation: Pre-oxygenation with 100% oxygen Intubation Type: IV induction, Cricoid Pressure applied and Rapid sequence Laryngoscope Size: Mac and 3 Grade View: Grade I Tube type: Oral Tube size: 8.5 mm Number of attempts: 1 Airway Equipment and Method: Stylet and LTA kit utilized Placement Confirmation: ETT inserted through vocal cords under direct vision,  positive ETCO2 and breath sounds checked- equal and bilateral Secured at: 21 cm Tube secured with: Tape Dental Injury: Teeth and Oropharynx as per pre-operative assessment

## 2012-11-22 NOTE — Interval H&P Note (Signed)
History and Physical Interval Note:  11/22/2012 8:34 AM  Stephanie Fletcher  has presented today for surgery, with the diagnosis of lung mass  The various methods of treatment have been discussed with the patient and family. After consideration of risks, benefits and other options for treatment, the patient has consented to  Procedure(s): VIDEO BRONCHOSCOPY WITH ENDOBRONCHIAL NAVIGATION (N/A) as a surgical intervention .  The patient's history has been reviewed, patient examined, no change in status, stable for surgery.  I have reviewed the patient's chart and labs.  Questions were answered to the patient's satisfaction.     HENDRICKSON,STEVEN C

## 2012-11-22 NOTE — Transfer of Care (Signed)
Immediate Anesthesia Transfer of Care Note  Patient: Stephanie Fletcher  Procedure(s) Performed: Procedure(s): VIDEO BRONCHOSCOPY WITH ENDOBRONCHIAL NAVIGATION (N/A)  Patient Location: PACU  Anesthesia Type:General  Level of Consciousness: awake and alert   Airway & Oxygen Therapy: Patient Spontanous Breathing and Patient connected to nasal cannula oxygen  Post-op Assessment: Report given to PACU RN  Post vital signs: Reviewed and stable  Complications: No apparent anesthesia complications

## 2012-11-22 NOTE — Anesthesia Preprocedure Evaluation (Signed)
Anesthesia Evaluation  Patient identified by MRN, date of birth, ID band Patient awake    Reviewed: Allergy & Precautions, H&P , NPO status , Patient's Chart, lab work & pertinent test results  Airway Mallampati: I TM Distance: >3 FB Neck ROM: full    Dental   Pulmonary          Cardiovascular hypertension, Rhythm:regular Rate:Normal     Neuro/Psych    GI/Hepatic PUD, GERD-  ,  Endo/Other  diabetes, Type 2, Oral Hypoglycemic Agents  Renal/GU      Musculoskeletal   Abdominal   Peds  Hematology   Anesthesia Other Findings   Reproductive/Obstetrics                           Anesthesia Physical Anesthesia Plan  ASA: III  Anesthesia Plan: General   Post-op Pain Management:    Induction: Intravenous  Airway Management Planned: Oral ETT  Additional Equipment:   Intra-op Plan:   Post-operative Plan: Extubation in OR  Informed Consent: I have reviewed the patients History and Physical, chart, labs and discussed the procedure including the risks, benefits and alternatives for the proposed anesthesia with the patient or authorized representative who has indicated his/her understanding and acceptance.     Plan Discussed with: CRNA, Anesthesiologist and Surgeon  Anesthesia Plan Comments:         Anesthesia Quick Evaluation

## 2012-11-22 NOTE — Preoperative (Signed)
Beta Blockers   Reason not to administer Beta Blockers:Not Applicable 

## 2012-11-22 NOTE — H&P (View-Only) (Signed)
PCP is MILLER,AILEEN H, MD Referring Provider is Aluisio, Frank V, MD  Chief Complaint  Patient presents with  . Lung Mass    LLLobe...eval and treat...CT CHEST, PET, PFT'S    HPI: Stephanie Fletcher is an 79-year-old woman who presented with a chief complaint of a lung mass.  Stephanie Fletcher is an 79-year-old lady who was being evaluated for a total knee replacement. As part of her preoperative evaluation she had a chest x-ray which showed a left lower lobe mass. This led to a CT of the chest which showed a left lower lobe mass with no mediastinal or hilar adenopathy. Today she had a PET/CT which showed the lesion to be hypermetabolic, albeit with an SUV of 4.9. There was no hilar or mediastinal adenopathy. She does have bilateral adrenal lesions consistent with adenomas.  She is a lifelong nonsmoker, although she did have exposure to secondhand smoke from her husband. She has no other exposures to speak of. Interestingly her husband had nocardia in 2006. She has no TB or fungal exposure. She does not keep birds and has not traveled recently. 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 She denies any recent fevers chills or sweats. Her most recent chest x-ray is about 15 years ago in [place].   Past Medical History  Diagnosis Date  . Measles   . Mumps   . Diabetes mellitus without complication     type 2  . Hypercholesterolemia   . Hypertension   . Diverticulitis   . Gastric ulcer   . Arthritis   . Anemia   . Back injury 1998    T-12 fracture (brace)    Past Surgical History  Procedure Laterality Date  . Abdominal hysterectomy  1979  . Hernia repair Right 1972  . Eye surgery Bilateral 2007, 2008    cataracts  . Appendectomy  1949  . Breast biopsy Bilateral 1965,1968  . Carpal tunnel release Bilateral 1997  . Skin graft  1955    right groin  . Cholecystectomy      No family history on file. Family history  Father are disease high blood  pressure, mother high blood pressure and stroke, brother high cholesterol and high blood pressure  Social History History  Substance Use Topics  . Smoking status: Never Smoker   . Smokeless tobacco: Never Used  . Alcohol Use: No    Current Outpatient Prescriptions  Medication Sig Dispense Refill  . Ascorbic Acid (VITAMIN C) 1000 MG tablet Take 1,000 mg by mouth daily.      . aspirin EC 81 MG tablet Take 81 mg by mouth at bedtime.      . Calcium Carbonate (CALCIUM 600 PO) Take 600 mg by mouth 2 (two) times daily.      . cholecalciferol (VITAMIN D) 1000 UNITS tablet Take 1,000 Units by mouth daily.      . cyanocobalamin (,VITAMIN B-12,) 1000 MCG/ML injection Inject 1,000 mcg into the muscle every 30 (thirty) days.      . dicyclomine (BENTYL) 20 MG tablet Take 20 mg by mouth every 6 (six) hours as needed (for stomach).      . Ferrous Sulfate (FEROSUL PO) Take 220 mg by mouth daily.      . fish oil-omega-3 fatty acids 1000 MG capsule Take 1 g by mouth daily.      . metFORMIN (GLUCOPHAGE) 500 MG tablet Take 1,000 mg by mouth 2 (two) times daily with a meal.      .   Multiple Vitamin (MULTIVITAMIN WITH MINERALS) TABS Take 1 tablet by mouth daily.      . Multiple Vitamins-Minerals (PRESERVISION AREDS 2 PO) Take 2 mg by mouth 2 (two) times daily.      . omeprazole (PRILOSEC) 20 MG capsule Take 20 mg by mouth 2 (two) times daily.      . pioglitazone (ACTOS) 15 MG tablet Take 15 mg by mouth every morning.       . polyethylene glycol (MIRALAX / GLYCOLAX) packet Take 17 g by mouth daily as needed (constipation).      . pravastatin (PRAVACHOL) 20 MG tablet Take 20 mg by mouth at bedtime.      . sitaGLIPtin (JANUVIA) 100 MG tablet Take 100 mg by mouth daily.      . valsartan-hydrochlorothiazide (DIOVAN HCT) 160-25 MG per tablet Take 1 tablet by mouth every morning.      . zolpidem (AMBIEN) 5 MG tablet Take 5 mg by mouth at bedtime as needed for sleep.       No current facility-administered medications  for this visit.    Allergies  Allergen Reactions  . Fosamax (Alendronate)     Review of Systems  Constitutional: Positive for activity change (Limited due to decreased energy and knee problems), fatigue and unexpected weight change (Lost 7 pounds over past 6). Negative for fever and chills.  Eyes: Positive for visual disturbance (Eyesight is worsened recently).  Respiratory: Negative for cough, chest tightness, shortness of breath and wheezing.   Cardiovascular: Negative for chest pain.  Musculoskeletal: Positive for joint swelling and arthralgias (knee pain, being evaluated for total knee replacement).  Neurological:       Neuropathy in legs  All other systems reviewed and are negative.    BP 157/68  Pulse 69  Resp 16  Ht 5' 6" (1.676 m)  Wt 143 lb (64.864 kg)  BMI 23.09 kg/m2  SpO2 97% Physical Exam  Vitals reviewed. Constitutional: She is oriented to person, place, and time. She appears well-developed and well-nourished. No distress.  Elderly  HENT:  Head: Normocephalic and atraumatic.  Eyes: EOM are normal. Pupils are equal, round, and reactive to light.  Wearing glasses  Neck: Neck supple. No thyromegaly present.  Cardiovascular: Normal rate, regular rhythm and normal heart sounds.   No murmur heard. Pulmonary/Chest: Effort normal and breath sounds normal. She has no wheezes. She has no rales.  Abdominal: Soft. There is no tenderness.  Musculoskeletal: She exhibits no edema.  Lymphadenopathy:    She has no cervical adenopathy.  Neurological: She is alert and oriented to person, place, and time. No cranial nerve deficit.  Skin: Skin is warm and dry.  Psychiatric: She has a normal mood and affect.     Diagnostic Tests: CT of chest 11/01/12 CT CHEST WITHOUT CONTRAST  Technique: Multidetector CT imaging of the chest was performed  following the standard protocol without IV contrast.  Comparison: Chest x-ray 10/29/2012.  Findings:  Mediastinum: Heart size is  upper limits of normal. There is no  significant pericardial fluid, thickening or pericardial  calcification. There is atherosclerosis of the thoracic aorta, the  great vessels of the mediastinum and the coronary arteries,  including calcified atherosclerotic plaque in the left main, left  anterior descending and left circumflex coronary arteries. No  pathologically enlarged mediastinal or hilar lymph nodes. Please  note that accurate exclusion of hilar adenopathy is limited on  noncontrast CT scans. Large hiatal hernia.  Lungs/Pleura: There is a large pleural-based mass in the inferior    aspect of the left lower lobe which measures approximately 5.2 x  2.9 cm (image 36 of series 4). There is a small amount of ground-  glass attenuation adjacent to this lesion in the periphery of the  left lower lobe, likely reflects some mild postobstructive changes.  One tiny calcification is noted along the anterior aspect of this  lesion on image 35 of series 3, however, this is not a specific  finding. 3 mm nodule in the left lower lobe (image 27 of series  4). No pleural effusions. No acute consolidative airspace disease.  Upper Abdomen: 1.6 cm low attenuation lesion extending  exophytically from the upper pole of the left kidney and 1.1 cm low  attenuation lesion in the anterior aspect of the lower pole left  kidney are incompletely characterized on this noncontrast CT  examination. 1-2 mm calcifications in the upper pole collecting  system of the left kidney may be nonobstructive calculi and/or  vascular calcifications. Extensive atherosclerosis is noted  throughout the upper abdominal vasculature. Status post  cholecystectomy.  Musculoskeletal: There are no aggressive appearing lytic or blastic  lesions noted in the visualized portions of the skeleton.  Compression fracture of L1 with approximately 50% loss of anterior  vertebral body height and 10% loss of posterior vertebral body  height with  mild buckling of the posterior cortex.  IMPRESSION:  1. 5.2 x 2.9 cm left lower lobe mass abutting the left major  fissure. There is a 3 mm nodule in the left lower lobe as well  which is nonspecific. No definite lymphadenopathy in the  mediastinum or hilum on this limited noncontrast CT examination.  This is highly suspicious for a primary bronchogenic neoplasm, and  correlation with PET CT and/or biopsy is highly recommended.  2. Atherosclerosis, including left main and two-vessel coronary  artery disease.  3. Large hiatal hernia.  4. Additional incidental findings, as above.   PET/CT 11/13/12 *RADIOLOGY REPORT*  Clinical Data: Initial treatment strategy for lung mass.  NUCLEAR MEDICINE PET SKULL BASE TO THIGH  Fasting Blood Glucose: 146  Technique: 15.4 mCi F-18 FDG was injected intravenously. CT data  was obtained and used for attenuation correction and anatomic  localization only. (This was not acquired as a diagnostic CT  examination.) Additional exam technical data entered on  technologist worksheet.  Comparison: CT chest dated 11/01/2012  Findings:  Neck: No hypermetabolic lymph nodes in the neck.  Chest: Evaluation is constrained by respiratory motion.  Mass in the left lower lobe along the fissure measures at least 3.9  x 2.8 cm (series 2/image 99), with max SUV 4.9 (PET image at 88),  compatible with primary bronchogenic neoplasm.  Additional 3 mm left lower lobe pulmonary nodule noted on prior CT  is not evident on the current study. Regardless, this would be  below the threshold for PET sensitivity.  No hypermetabolic mediastinal or hilar nodes.  Abdomen/Pelvis: No abnormal hypermetabolic activity within the  liver, pancreas, or spleen.  1.4 x 1.4 cm right adrenal nodule (series 2/image 109) and 1.9 x  1.1 cm left renal nodule (series 2/image 111), measuring less than  10 HUs on unenhanced CT and without corresponding hypermetabolism  on PET, reflecting adrenal  adenomas.  No hypermetabolic lymph nodes in the abdomen or pelvis.  Moderate hiatal hernia.  Scattered areas of GI uptake without corresponding suspicious focal  abnormality on CT.  Skeleton: No focal hypermetabolic activity to suggest skeletal  metastasis.  IMPRESSION:  Left lower lobe mass measuring   at least 3.9 cm, max SUV 4.9,  compatible with primary bronchogenic neoplasm.  Bilateral adrenal adenomas, as described above.  No evidence of metastatic disease.   Impression: Stephanie Fletcher is an 79-year-old woman who is being evaluated for elective total knee replacement. During her preoperative evaluation she was found to have a left lower lobe mass. This was confirmed by CT scan and was a 5.2 x 2.9 cm mass in the left lower lobe abutting the major fissure. She then had a PET/CT today which showed the lesion to be hypermetabolic with a maximum SUV of 4.9.  She does not have any recent past x-rays for comparison. Interestingly the lesion measured a little smaller on the PET CT than it did on the CT 3 weeks ago. Another interesting finding is that her husband had a Nocardia infection in 2006. The lesion is hypermetabolic, although the SUV is not particularly high given the size of the lesion. I cannot rule out that this is a primary bronchogenic carcinoma. However I do think it's within the relative possibility that this represents an infectious or inflammatory mass. Given that possibility I think the best initial approach would be to proceed with electromagnetic navigational bronchoscopy system we can obtain biopsies and specimens for cultures. I discussed this in detail with the patient as well as her sister-in-law who accompanied her. We talked about the need for general anesthesia, the general nature of the procedure, and possible outcomes. They do understand there is a possibility of false negative findings with this procedure. We will plan to do it on an outpatient basis. They understand the  procedure will be done with a flexible bronchoscope via endotracheal tube. We discussed the risks of the procedure which include those related general anesthesia including death, MI, stroke, blood clots, as well as the risks of a false negative or nondiagnostic study, bleeding and pneumothorax. They do understand that additional diagnostic or therapeutic procedures may be needed.  She understands and accepts the risk and wishes to proceed   Plan: Bronchoscopy/electromagnetic navigational bronchoscopy for biopsies and cultures on Thursday, March 13. 

## 2012-11-23 NOTE — Op Note (Signed)
Stephanie Fletcher, Stephanie Fletcher            ACCOUNT NO.:  [id number]  MEDICAL RECORD NO.:  [id number]  LOCATION:  [facility]                         FACILITY:  [facility]  PHYSICIAN:  Salvatore Decent. Dorris Fetch, M.D.DATE OF BIRTH:  1929-03-27  DATE OF PROCEDURE:  11/22/2012 DATE OF DISCHARGE:  11/22/2012                              OPERATIVE REPORT   PREOPERATIVE DIAGNOSIS:  Left lower lobe mass.  POSTOPERATIVE DIAGNOSIS:  Non-small-cell carcinoma, left lower lobe.  PROCEDURE:  Bronchoscopy, electromagnetic navigational bronchoscopy with brushings, biopsies, needle aspirations, and bronchoalveolar lavage.  SURGEON:  Salvatore Decent. Dorris Fetch, MD  ANESTHESIA:  General.  FINDINGS:  Quick prep of brushings revealed non-small-cell carcinoma.  CLINICAL NOTE:  Stephanie Fletcher is an 79 year old woman, who is being evaluated for knee replacement.  She had a preoperative chest x-ray which revealed a possible lung mass.  A CT scan confirmed of the mass in the left lower lobe.  A PET scan showed the mass was hypermetabolic. She was advised to undergo bronchoscopy with electromagnetic navigation for diagnostic purposes.  The indications, risks, benefits, and alternatives were discussed in detail with the patient.  She understood and accepted the risk and agreed to proceed.  OPERATIVE NOTE:  Stephanie Fletcher was brought to the preoperative holding area on November 22, 2012.  There Anesthesia established intravenous access. She was taken to the operating room, anesthetized, and intubated. Flexible fiberoptic bronchoscopy was performed via the endotracheal tube.  It revealed normal endobronchial anatomy and no endobronchial lesions.  The locatable guide then was placed into a 90-degree sheath and advanced through the bronchoscope.  The bronchial tree was mapped.  There was good correlation with the virtual bronchoscopy.  The sheath then was navigated into the left lower lobe.  The appropriate segmental bronchus was selected and we  were able to navigate within 1 cm of the mass without difficulty.  The locatable guide was removed for sampling. After every 4-5 samples, the locatable guide was replaced to ensure that the catheter was still in good position.  Brushings were performed first and these slides were sent for quick prep.  While awaiting the results of That, multiple biopsies were taken.  Needle aspirations were performed and bronchial alveolar lavage was performed with specimens being sent for both cultures and cytology.  At this point, the bronchial brushing quick prep returned non-small-cell carcinoma.  Additional biopsies were taken to allow adequate tissue for molecular testing.  The sheath then was removed.  A final inspection was made with the bronchoscope. There was no ongoing bleeding.  The bronchoscope was removed.  The patient was extubated in the operating room and taken to the postanesthetic care unit in good condition.     Salvatore Decent Dorris Fetch, M.D.     SCH/MEDQ  D:  11/22/2012  T:  11/23/2012  Job:  [id number]

## 2012-11-24 ENCOUNTER — Encounter (HOSPITAL_COMMUNITY): Payer: Self-pay | Admitting: Thoracic Surgery (Cardiothoracic Vascular Surgery)

## 2012-11-25 LAB — CULTURE, RESPIRATORY W GRAM STAIN: Gram Stain: NONE SEEN

## 2012-11-27 NOTE — Addendum Note (Signed)
Addendum created 11/27/12 1108 by Adair Laundry, CRNA   Modules edited: Charges VN

## 2012-11-29 ENCOUNTER — Ambulatory Visit: Payer: Medicare Other | Admitting: Internal Medicine

## 2012-11-29 ENCOUNTER — Encounter: Payer: Medicare Other | Admitting: Thoracic Surgery (Cardiothoracic Vascular Surgery)

## 2012-11-29 ENCOUNTER — Ambulatory Visit: Payer: Medicare Other | Admitting: Radiation Oncology

## 2012-11-29 ENCOUNTER — Other Ambulatory Visit: Payer: Self-pay

## 2012-11-30 ENCOUNTER — Other Ambulatory Visit: Payer: Self-pay | Admitting: Radiology

## 2012-11-30 ENCOUNTER — Encounter (HOSPITAL_COMMUNITY): Payer: Self-pay | Admitting: Pharmacy Technician

## 2012-12-04 ENCOUNTER — Encounter (HOSPITAL_COMMUNITY): Payer: Self-pay

## 2012-12-04 ENCOUNTER — Ambulatory Visit (HOSPITAL_COMMUNITY)
Admission: RE | Admit: 2012-12-04 | Discharge: 2012-12-04 | Disposition: A | Discharge status: Home / Self Care | Payer: Medicare Other | Source: Ambulatory Visit | Attending: Thoracic Surgery (Cardiothoracic Vascular Surgery) | Admitting: Thoracic Surgery (Cardiothoracic Vascular Surgery)

## 2012-12-04 ENCOUNTER — Ambulatory Visit (HOSPITAL_COMMUNITY)
Admission: RE | Admit: 2012-12-04 | Discharge: 2012-12-04 | Disposition: A | Discharge status: Home / Self Care | Payer: Medicare Other | Source: Ambulatory Visit | Attending: Interventional Radiology | Admitting: Interventional Radiology

## 2012-12-04 DIAGNOSIS — C3430 Malignant neoplasm of lower lobe, unspecified bronchus or lung: Secondary | ICD-10-CM | POA: Insufficient documentation

## 2012-12-04 DIAGNOSIS — Z8711 Personal history of peptic ulcer disease: Secondary | ICD-10-CM | POA: Insufficient documentation

## 2012-12-04 DIAGNOSIS — E785 Hyperlipidemia, unspecified: Secondary | ICD-10-CM | POA: Insufficient documentation

## 2012-12-04 DIAGNOSIS — K219 Gastro-esophageal reflux disease without esophagitis: Secondary | ICD-10-CM | POA: Insufficient documentation

## 2012-12-04 DIAGNOSIS — E7800 Pure hypercholesterolemia, unspecified: Secondary | ICD-10-CM | POA: Insufficient documentation

## 2012-12-04 DIAGNOSIS — D381 Neoplasm of uncertain behavior of trachea, bronchus and lung: Secondary | ICD-10-CM

## 2012-12-04 DIAGNOSIS — I10 Essential (primary) hypertension: Secondary | ICD-10-CM | POA: Insufficient documentation

## 2012-12-04 DIAGNOSIS — E119 Type 2 diabetes mellitus without complications: Secondary | ICD-10-CM | POA: Insufficient documentation

## 2012-12-04 DIAGNOSIS — K5730 Diverticulosis of large intestine without perforation or abscess without bleeding: Secondary | ICD-10-CM | POA: Insufficient documentation

## 2012-12-04 DIAGNOSIS — M129 Arthropathy, unspecified: Secondary | ICD-10-CM | POA: Insufficient documentation

## 2012-12-04 LAB — CBC
HCT: 35.9 % — ABNORMAL LOW (ref 36.0–46.0)
MCV: 88.6 fL (ref 78.0–100.0)
RDW: 12.7 % (ref 11.5–15.5)
WBC: 5.4 10*3/uL (ref 4.0–10.5)

## 2012-12-04 LAB — GLUCOSE, CAPILLARY: Glucose-Capillary: 124 mg/dL — ABNORMAL HIGH (ref 70–99)

## 2012-12-04 MED ORDER — SODIUM CHLORIDE 0.9 % IV SOLN
INTRAVENOUS | Status: DC
  Administered 2012-12-04: 1000 mL via INTRAVENOUS

## 2012-12-04 MED ORDER — FENTANYL CITRATE 0.05 MG/ML IJ SOLN
INTRAMUSCULAR | Status: AC | PRN
  Administered 2012-12-04: 50 ug via INTRAVENOUS

## 2012-12-04 MED ORDER — MIDAZOLAM HCL 2 MG/2ML IJ SOLN
INTRAMUSCULAR | Status: AC | PRN
  Administered 2012-12-04: 1 mg via INTRAVENOUS

## 2012-12-04 MED ORDER — FENTANYL CITRATE 0.05 MG/ML IJ SOLN
INTRAMUSCULAR | Status: AC
  Filled 2012-12-04: qty 4

## 2012-12-04 MED ORDER — MIDAZOLAM HCL 2 MG/2ML IJ SOLN
INTRAMUSCULAR | Status: AC
  Filled 2012-12-04: qty 4

## 2012-12-04 NOTE — ED Notes (Signed)
Patient is having chest x-ray prior to transferring to [facility].

## 2012-12-04 NOTE — H&P (Signed)
Stephanie Fletcher is an 79 y.o. female.   Chief Complaint: left lung mass Was scheduled for knee surgery in 10/2012; Pre-op cxr revealed lung mass Referred to Dr Dorris Fetch- bronch- non diagnostic Now scheduled for left lung mass biopsy HPI: DM; HLD; HTN  Past Medical History  Diagnosis Date  . Measles   . Mumps   . Diabetes mellitus without complication     type 2  . Hypercholesterolemia   . Hypertension   . Diverticulitis   . Gastric ulcer   . Arthritis   . Anemia   . Back injury 1998    T-12 fracture (brace)  . Nodule of left lung   . GERD (gastroesophageal reflux disease)     Past Surgical History  Procedure Laterality Date  . Abdominal hysterectomy  1979  . Hernia repair Right 1972  . Eye surgery Bilateral 2007, 2008    cataracts  . Appendectomy  1949  . Breast biopsy Bilateral [id number]  . Carpal tunnel release Bilateral 1997  . Skin graft  1955    right groin  . Cholecystectomy    . Video bronchoscopy with endobronchial navigation N/A 11/22/2012    Procedure: VIDEO BRONCHOSCOPY WITH ENDOBRONCHIAL NAVIGATION;  Surgeon: Loreli Slot, MD;  Location: [facility];  Service: Thoracic;  Laterality: N/A;    Family History  Problem Relation Age of Onset  . Hypertension Mother   . Stroke Mother   . Hypertension Father   . Heart disease Father   . Hypertension Brother    Social History:  reports that she has never smoked. She has never used smokeless tobacco. She reports that she does not drink alcohol or use illicit drugs.  Allergies:  Allergies  Allergen Reactions  . Fosamax (Alendronate) Rash     (Not in a hospital admission)  Results for orders placed during the hospital encounter of 12/04/12 (from the past 48 hour(s))  GLUCOSE, CAPILLARY     Status: Abnormal   Collection Time    12/04/12  7:28 AM      Result Value Range   Glucose-Capillary 124 (*) 70 - 99 mg/dL  CBC     Status: Abnormal   Collection Time    12/04/12  7:42 AM      Result Value  Range   WBC 5.4  4.0 - 10.5 K/uL   RBC 4.05  3.87 - 5.11 MIL/uL   Hemoglobin 12.0  12.0 - 15.0 g/dL   HCT 86.5 (*) 78.4 - 69.6 %   MCV 88.6  78.0 - 100.0 fL   MCH 29.6  26.0 - 34.0 pg   MCHC 33.4  30.0 - 36.0 g/dL   RDW 29.5  28.4 - 13.2 %   Platelets 276  150 - 400 K/uL   No results found.  Review of Systems  Constitutional: Negative for fever and weight loss.  Respiratory: Negative for cough and shortness of breath.   Cardiovascular: Negative for chest pain.  Gastrointestinal: Negative for nausea, vomiting and abdominal pain.  Musculoskeletal: Negative for back pain.  Neurological: Negative for weakness and headaches.    Blood pressure 140/73, pulse 77, temperature 98 F (36.7 C), temperature source Oral, resp. rate 18, height 5\' 6"  (1.676 m), weight 145 lb (65.772 kg), SpO2 95.00%. Physical Exam  Constitutional: She is oriented to person, place, and time.  Cardiovascular: Normal rate, regular rhythm and normal heart sounds.   No murmur heard. Respiratory: Effort normal and breath sounds normal. She has no wheezes.  GI: Soft. Bowel sounds  are normal. There is no tenderness.  Musculoskeletal: Normal range of motion.  Neurological: She is alert and oriented to person, place, and time.  Psychiatric: She has a normal mood and affect. Her behavior is normal. Judgment and thought content normal.     Assessment/Plan Left lung mass Scheduled for bx Pt aware of procedure benefits and risks and agreeable to proceed Consent signed and in chart  Runa Whittingham A 12/04/2012, 8:28 AM

## 2012-12-04 NOTE — Procedures (Signed)
Technically successful CT guided biopsy of left lower lobe pulmonary nodule.  No immediate post procedural complications.

## 2012-12-11 ENCOUNTER — Encounter: Payer: Self-pay | Admitting: Thoracic Surgery (Cardiothoracic Vascular Surgery)

## 2012-12-11 ENCOUNTER — Ambulatory Visit (INDEPENDENT_AMBULATORY_CARE_PROVIDER_SITE_OTHER): Payer: Medicare Other | Admitting: Thoracic Surgery (Cardiothoracic Vascular Surgery)

## 2012-12-11 DIAGNOSIS — Z7189 Other specified counseling: Secondary | ICD-10-CM

## 2012-12-11 DIAGNOSIS — C3430 Malignant neoplasm of lower lobe, unspecified bronchus or lung: Secondary | ICD-10-CM

## 2012-12-11 DIAGNOSIS — Z712 Person consulting for explanation of examination or test findings: Secondary | ICD-10-CM

## 2012-12-11 NOTE — Progress Notes (Signed)
HPI:  Stephanie Fletcher returns today to discuss results of her needle biopsy.  She is an 79 year old woman and a lifelong nonsmoker, who was found to have a lung nodule during her preoperative evaluation for knee replacement. We did a navigational bronchoscopy on her which on a quick prep showed adenocarcinoma, however on the final pathology it was determined that this could not be definitively diagnosed. Therefore we had a needle biopsy done under CT guidance. This was positive for adenocarcinoma. She tolerated the needle biopsy well did not have any complications. She returns today to discuss the results.  Past Medical History  Diagnosis Date  . Measles   . Mumps   . Diabetes mellitus without complication     type 2  . Hypercholesterolemia   . Hypertension   . Diverticulitis   . Gastric ulcer   . Arthritis   . Anemia   . Back injury 1998    T-12 fracture (brace)  . Nodule of left lung   . GERD (gastroesophageal reflux disease)       Current Outpatient Prescriptions  Medication Sig Dispense Refill  . aspirin EC 81 MG tablet Take 81 mg by mouth at bedtime.      . cyanocobalamin (,VITAMIN B-12,) 1000 MCG/ML injection Inject 1,000 mcg into the muscle every 30 (thirty) days.      [name] dicyclomine (BENTYL) 20 MG tablet Take 20 mg by mouth every 6 (six) hours as needed (for stomach).      . metFORMIN (GLUCOPHAGE) 500 MG tablet Take 500-1,000 mg by mouth 3 (three) times daily. Take 2 tablets (1000 mg) every morning, 1 tablet (500 mg) at noon, 1 tablet (500 mg) every evening      . Multiple Vitamins-Minerals (PRESERVISION AREDS 2 PO) Take 2 mg by mouth 2 (two) times daily.      [name] omeprazole (PRILOSEC) 20 MG capsule Take 20 mg by mouth 2 (two) times daily as needed (acid reflux).       . pioglitazone (ACTOS) 15 MG tablet Take 15 mg by mouth every morning.       . polyethylene glycol (MIRALAX / GLYCOLAX) packet Take 17 g by mouth daily as needed (constipation).      . pravastatin (PRAVACHOL) 20  MG tablet Take 20 mg by mouth at bedtime.      . sitaGLIPtin (JANUVIA) 100 MG tablet Take 100 mg by mouth daily.      . valsartan-hydrochlorothiazide (DIOVAN HCT) 160-25 MG per tablet Take 1 tablet by mouth every morning.      . zolpidem (AMBIEN) 5 MG tablet Take 5 mg by mouth at bedtime as needed for sleep.       No current facility-administered medications for this visit.    Physical Exam BP 150/60  Pulse 100  Resp 16  Ht 5\' 6"  (1.676 m)  Wt 143 lb (64.864 kg)  BMI 23.09 kg/m2  SpO2 97% Elderly woman in no acute distress Lungs clear with equal breath sounds   Diagnostic Tests: CT-guided needle biopsy pathology positive for adenocarcinoma  Impression: Sources an 79 year old woman with a newly diagnosed adenocarcinoma in the left lower lobe. This is approximately 5 cm in greatest dimension. It does abut the major fissure. There is no evidence of regional or distant metastases. I had a long discussion with Stephanie Fletcher and her daughter regarding the diagnosis and potential treatments. They understand 2 treatment options for clinical stage I disease would be surgery and radiation. We did discuss new or radiation techniques such  as SBRT. We discussed the relative advantages and disadvantages of each approach. They do understand that surgery has a higher rate of cure, but at the cost of increased morbidity and mortality. She is reluctant to consider surgery and wishes to talk with a radiation oncologist.   Plan: We will arrange for her to see Dr. Mitzi Hansen in [place] this week to discuss radiation. I will be happy to meet with her again after she talks with Dr. Mitzi Hansen to answer any further questions.

## 2012-12-13 ENCOUNTER — Encounter: Payer: Self-pay | Admitting: *Deleted

## 2012-12-13 ENCOUNTER — Ambulatory Visit
Admission: RE | Admit: 2012-12-13 | Discharge: 2012-12-13 | Disposition: A | Discharge status: Home / Self Care | Payer: Medicare Other | Source: Ambulatory Visit | Attending: Radiation Oncology | Admitting: Radiation Oncology

## 2012-12-13 ENCOUNTER — Encounter: Payer: Self-pay | Admitting: Radiation Oncology

## 2012-12-13 NOTE — Progress Notes (Signed)
Spoke with pt and family at [facility] today.  Gave and explained educational/resource information on lung cancer and resources at [facility].  Distress screening completed.  Questions and concerns addressed

## 2012-12-17 DIAGNOSIS — C3432 Malignant neoplasm of lower lobe, left bronchus or lung: Secondary | ICD-10-CM

## 2012-12-17 HISTORY — DX: Malignant neoplasm of lower lobe, left bronchus or lung: C34.32

## 2012-12-17 NOTE — Progress Notes (Signed)
Radiation Oncology         [phone] ________________________________  Name: Stephanie Fletcher MRN: [id number]  Date: 12/13/2012  DOB: Feb 06, 1929  NF:[id number],MVHQIO H, MD  Loreli Slot, *     REFERRING PHYSICIAN: Loreli Slot, *   DIAGNOSIS: The encounter diagnosis was Lung cancer, lower lobe, left.   HISTORY OF PRESENT ILLNESS::Stephanie Fletcher is a 79 y.o. female who is seen for an initial consultation visit. The patient is seen today in multidisciplinary lung cancer clinic. She was found to have a lung nodule during preoperative evaluation for knee surgery. A CT scan of the chest on 11/01/2012 showed a 5.2 cm left lower lobe mass abutting the left major fissure. A PET scan was performed on 11/13/2012. This showed that the left lower lobe mass was hypermetabolic with a maximum SUV of 4.9. This mass measured at least 3.9 cm.  The patient proceeded with navigational bronchoscopy. The endobronchial anatomy was normal. The final pathology from this procedure returned nondiagnostic. She therefore proceeded to undergo a CT-guided needle core biopsy. This has returned positive for adenocarcinoma.  The patient therefore has been diagnosed as having an early stage non-small cell lung cancer, T2, N0, M0. Possible surgical resection has been discussed with the patient. They are interested also in nonsurgical options and therefore I been asked to see the patient today in clinic. Her case was discussed in multidisciplinary conference this morning.   PREVIOUS RADIATION THERAPY: No   PAST MEDICAL HISTORY:  has a past medical history of Measles; Mumps; Diabetes mellitus without complication; Hypercholesterolemia; Hypertension; Diverticulitis; Gastric ulcer; Arthritis; Anemia; Back injury (1998); Nodule of left lung; and GERD (gastroesophageal reflux disease).     PAST SURGICAL HISTORY: Past Surgical History  Procedure Laterality Date  . Abdominal hysterectomy  1979  . Hernia  repair Right 1972  . Eye surgery Bilateral 2007, 2008    cataracts  . Appendectomy  1949  . Breast biopsy Bilateral [id number]  . Carpal tunnel release Bilateral 1997  . Skin graft  1955    right groin  . Cholecystectomy    . Video bronchoscopy with endobronchial navigation N/A 11/22/2012    Procedure: VIDEO BRONCHOSCOPY WITH ENDOBRONCHIAL NAVIGATION;  Surgeon: Loreli Slot, MD;  Location: [facility] OR;  Service: Thoracic;  Laterality: N/A;     FAMILY HISTORY: family history includes Heart disease in her father; Hypertension in her brother, father, and mother; and Stroke in her mother.   SOCIAL HISTORY:  reports that she has never smoked. She has never used smokeless tobacco. She reports that she does not drink alcohol or use illicit drugs.   ALLERGIES: Fosamax   MEDICATIONS:  Current Outpatient Prescriptions  Medication Sig Dispense Refill  . aspirin EC 81 MG tablet Take 81 mg by mouth at bedtime.      . cyanocobalamin (,VITAMIN B-12,) 1000 MCG/ML injection Inject 1,000 mcg into the muscle every 30 (thirty) days.      [name] dicyclomine (BENTYL) 20 MG tablet Take 20 mg by mouth every 6 (six) hours as needed (for stomach).      . metFORMIN (GLUCOPHAGE) 500 MG tablet Take 500-1,000 mg by mouth 3 (three) times daily. Take 2 tablets (1000 mg) every morning, 1 tablet (500 mg) at noon, 1 tablet (500 mg) every evening      . Multiple Vitamins-Minerals (PRESERVISION AREDS 2 PO) Take 2 mg by mouth 2 (two) times daily.      [name] omeprazole (PRILOSEC) 20 MG capsule Take 20 mg by mouth 2 (  two) times daily as needed (acid reflux).       . pioglitazone (ACTOS) 15 MG tablet Take 15 mg by mouth every morning.       . polyethylene glycol (MIRALAX / GLYCOLAX) packet Take 17 g by mouth daily as needed (constipation).      . pravastatin (PRAVACHOL) 20 MG tablet Take 20 mg by mouth at bedtime.      . sitaGLIPtin (JANUVIA) 100 MG tablet Take 100 mg by mouth daily.      . valsartan-hydrochlorothiazide (DIOVAN  HCT) 160-25 MG per tablet Take 1 tablet by mouth every morning.      . zolpidem (AMBIEN) 5 MG tablet Take 5 mg by mouth at bedtime as needed for sleep.       No current facility-administered medications for this encounter.     REVIEW OF SYSTEMS:  A 15 point review of systems is documented in the electronic medical record. This was obtained by the nursing staff. However, I reviewed this with the patient to discuss relevant findings and make appropriate changes.  Pertinent items are noted in HPI.    PHYSICAL EXAM:  height is 5\' 6"  (1.676 m) and weight is 143 lb (64.864 kg). Her oral temperature is 97.5 F (36.4 C). Her blood pressure is 130/68 and her pulse is 86. Her respiration is 18 and oxygen saturation is 97%.   General: Well-developed, in no acute distress HEENT: Normocephalic, atraumatic; oral cavity clear Neck: Supple without any lymphadenopathy Cardiovascular: Regular rate and rhythm Respiratory: Clear to auscultation bilaterally GI: Soft, nontender, normal bowel sounds Extremities: No edema present Neuro: No focal deficits    LABORATORY DATA:  Lab Results  Component Value Date   WBC 5.4 12/04/2012   HGB 12.0 12/04/2012   HCT 35.9* 12/04/2012   MCV 88.6 12/04/2012   PLT 276 12/04/2012   Lab Results  Component Value Date   NA 137 11/20/2012   K 4.3 11/20/2012   CL 102 11/20/2012   CO2 27 11/20/2012   Lab Results  Component Value Date   ALT 10 11/20/2012   AST 11 11/20/2012   ALKPHOS 57 11/20/2012   BILITOT 0.2* 11/20/2012      RADIOGRAPHY: Dg Chest 1 View  12/04/2012  *RADIOLOGY REPORT*  Clinical Data: Post left lower lobe nodule biopsy  CHEST - 1 VIEW  Comparison: 11/20/2012  Findings: No pneumothorax is seen following left lung biopsy.  Stable left lower lobe nodule/mass.  Cardiomegaly.  Moderate hiatal hernia.  Deformity related to old left humeral fracture.  IMPRESSION: No pneumothorax is seen following left lung biopsy.  Stable left lower lobe nodule/mass.   Original  Report Authenticated By: Charline Bills, M.D.    Dg Chest 2 View Within Previous 72 Hours.  Films Obtained On Friday Are Acceptable For Monday And Tuesday Cases  11/20/2012  *RADIOLOGY REPORT*  Clinical Data: Bronchoscopy  CHEST - 2 VIEW  Comparison: PET CT scan 11/13/2012  Findings: Normal cardiac silhouette.  There is elevation left hemidiaphragm.  No evidence of lung collapse or pneumothorax.  No pulmonary contusion.  Left lower lobe mass is again identified. Large hiatal hernia noted.  IMPRESSION:  1.  No complication following bronchoscopy. 2.  Left lower lobe mass. 3.  Hiatal hernia.   Original Report Authenticated By: Genevive Bi, M.D.    Ct Biopsy  12/04/2012  *RADIOLOGY REPORT*  Indication: Hypermetabolic left lower lobe pulmonary mass worrisome for bronchogenic carcinoma  CT GUIDED LEFT LOWER LOBE NODULE CORE NEEDLE BIOPSY  Comparisons: PET  CT - 11/13/2012; chest CT - 11/01/2012  Intravenous medications: Fentanyl 50 mcg IV; Versed 1 mg IV  Contrast: None  Sedation time: 20 minutes  CT Fluoroscopy time: 10 seconds  Complications: None immediate  TECHNIQUE/FINDINGS:  Informed consent was obtained from the patient following an explanation of the procedure, risks, benefits and alternatives. The patient understands, agrees and consents for the procedure. All questions were addressed.  A time out was performed prior to the initiation of the procedure.  The patient was initially positioned slightly RPO on the CT table and a limited chest CT was performed for procedural planning demonstrating grossly unchanged appearance of approximately 3.2 x 3.8 cm pulmonary mass within the left lower lobe (image 13, series 2). Given the redundancy of the left lateral chest wall subcutaneous tissue, the patient was positioned supine on the CT gantry and repeat imaging was performed demonstrating a more acceptable percutaneous biopsy window.  The procedure was planned.  The operative site was prepped and draped in the  usual sterile fashion.  Under sterile conditions and local anesthesia, a 17 gauge coaxial needle was advanced into the peripheral aspect of the nodule.  Positioning was confirmed with intermittent CT fluoroscopy and this was followed by the acquisition of 4 core needle biopsies with an 18 gauge core needle biopsy device.  Limited post procedural chest CT was negative for pneumothorax or additional complication.  The co-axial needle was removed and hemostasis was achieved with manual compression.  A dressing was placed.  The patient tolerated the procedure well without immediate postprocedural complication.  The patient was escorted to have an upright chest radiograph.  IMPRESSION:  Technically successful CT guided core needle biopsy of indeterminate hypermetabolic pulmonary nodule/mass within the left lower lobe.   Original Report Authenticated By: Tacey Ruiz, MD    Dg C-arm Bronchoscopy  11/22/2012  CLINICAL DATA: intraoperative   C-ARM BRONCHOSCOPY  Fluoroscopy was utilized by the requesting physician.  No radiographic  interpretation.         IMPRESSION: The patient has a recent diagnosis of T2, N0, M0 non-small cell lung cancer, adenocarcinoma, of the left lower lobe. The patient is an appropriate candidate to consider surgical resection. However, the patient has been hesitant to entertain the notion to proceed with this. We discussed possible treatment options for this tumor today, including both surgery and sizable stereotactic body radiotherapy.  I discussed with the patient the possibility of a 3-D to 5 fraction course of stereotactic body radiotherapy. I discussed the rationale of this treatment as well as the potential success rate. We also discussed the potential side effects and risks of treatment. All of their questions were answered.   PLAN: After this discussion, the patient indicated that she really did not want to proceed with surgery and she would prefer to proceed with definitive  radiotherapy. The patient will therefore be scheduled for a simulation as soon as this can be arranged. I look forward to seeing her in clinic at [facility] to proceed with treatment planning.    I spent 60 minutes minutes face to face with the patient and more than 50% of that time was spent in counseling and/or coordination of care.    ________________________________   Radene Gunning, MD, PhD

## 2012-12-19 LAB — FUNGUS CULTURE W SMEAR

## 2012-12-20 ENCOUNTER — Telehealth: Payer: Self-pay | Admitting: Radiation Oncology

## 2012-12-20 ENCOUNTER — Other Ambulatory Visit: Payer: Self-pay | Admitting: Radiation Oncology

## 2012-12-20 ENCOUNTER — Ambulatory Visit
Admission: RE | Admit: 2012-12-20 | Discharge: 2012-12-20 | Disposition: A | Discharge status: Home / Self Care | Payer: Medicare Other | Source: Ambulatory Visit | Attending: Radiation Oncology | Admitting: Radiation Oncology

## 2012-12-20 DIAGNOSIS — C3430 Malignant neoplasm of lower lobe, unspecified bronchus or lung: Secondary | ICD-10-CM | POA: Insufficient documentation

## 2012-12-20 DIAGNOSIS — Z510 Encounter for antineoplastic radiation therapy: Secondary | ICD-10-CM | POA: Insufficient documentation

## 2012-12-20 NOTE — Telephone Encounter (Signed)
Met w patient to discuss RO billing. Pt had no financial concerns today.  Dx: Lung cancer, lower lobe, left - Primary 162.5  Attending Rad: JM   Rad Tx: SRS

## 2012-12-24 NOTE — Progress Notes (Signed)
[facility] Radiation Oncology Simulation and Treatment Planning Note   Name:  Stephanie Fletcher MRN: [id number]   Date: 12/20/2012  DOB: 09-Apr-1929  Status:outpatient    DIAGNOSIS: The encounter diagnosis was Lung cancer, lower lobe, left.  SITE:  Left lower lobe   CONSENT VERIFIED:yes   SET UP: Patient is setup supine   IMMOBILIZATION: The patient was immobilized using a Vac Loc bag, and a customized active form device was also constructed to aid in patient immobilization. The patient set up also involved abdominal compression to attempt to reduce respiratory motion. A total of 2 complex treatment devices therefore will be used for immobilization during the course of radiation.    NARRATIVE:The patient was brought to the CT Simulation planning suite.  Identity was confirmed.  All relevant records and images related to the planned course of therapy were reviewed.  Then, the patient was positioned in a stable reproducible clinical set-up for radiation therapy. Abdominal compression was applied by me.  4D CT images were obtained and reproducible breathing pattern was confirmed. Free breathing CT images were obtained.  Skin markings were placed.  The CT images were loaded into the planning software where the target and avoidance structures were contoured.  The radiation prescription was entered and confirmed.    TREATMENT PLANNING NOTE:  Treatment planning then occurred. I have requested : MLC's, 3D simulation/ isodose plan, basic dose calculation. It is anticipated that 2 customized fields will be used for the patient's treatment, with each of these corresponding to an additional complex treatment device.  3 dimensional simulation is performed and dose volume histogram of the gross tumor volume, planning tumor volume and criticial normal structures including the spinal cord and lungs were analyzed and requested.  Special treatment procedure was performed due to high dose  per fraction and the complexity of the planning process.  The patient will be monitored for increased risk of toxicity.  Daily imaging using cone beam CT/ MV CT will be used for target localization.   PLAN:  The patient will receive 60 Gy in 5 fractions.    ________________________________   Radene Gunning, MD, PhD

## 2012-12-27 ENCOUNTER — Encounter: Payer: Self-pay | Admitting: Radiation Oncology

## 2012-12-31 ENCOUNTER — Ambulatory Visit
Admission: RE | Admit: 2012-12-31 | Discharge: 2012-12-31 | Disposition: A | Discharge status: Home / Self Care | Payer: Medicare Other | Source: Ambulatory Visit | Attending: Radiation Oncology | Admitting: Radiation Oncology

## 2013-01-01 NOTE — Progress Notes (Signed)
  Radiation Oncology         [phone] ________________________________  Name: Stephanie Fletcher MRN: [id number]  Date: 12/31/2012  DOB: [birth year]  Simulation verification note  The patient underwent film verification for the patient's set-up in preparation for stereotactic body radiosurgery. The patient was placed on the treatment unit and a CT scan was performed. These images were then fused with the patient's planning CT scan. The fusion was carefully reviewed in terms of the patient's anatomy as it related to the planning CT scan. The target structures as well as the organs at risk were evaluated and also the isodose curves were also reviewed on the patient's treatment CT scan. The target and the normal structures were appropriately aligned for treatment. Therefore the patient proceeded with the first fraction of stereotactic body radiosurgery.   ________________________________  Radene Gunning, MD, PhD

## 2013-01-02 ENCOUNTER — Ambulatory Visit
Admission: RE | Admit: 2013-01-02 | Discharge: 2013-01-02 | Disposition: A | Discharge status: Home / Self Care | Payer: Medicare Other | Source: Ambulatory Visit | Attending: Radiation Oncology | Admitting: Radiation Oncology

## 2013-01-02 NOTE — Progress Notes (Signed)
  Radiation Oncology         [phone] ________________________________  Name: Stephanie Fletcher MRN: [id number]  Date: 01/02/2013  DOB: [birth year]  Stereotactic Body Radiotherapy Treatment Procedure Note  NARRATIVE:  Stephanie Fletcher was brought to the stereotactic radiation treatment machine and placed supine on the CT couch. The patient was set up for stereotactic body radiotherapy on the body fix pillow.  3D TREATMENT PLANNING AND DOSIMETRY:  The patient's radiation plan was reviewed and approved prior to starting treatment.  It showed 3-dimensional radiation distributions overlaid onto the planning CT.  The [facility] for the target structures as well as the organs at risk were reviewed. The documentation of this is filed in the radiation oncology EMR.  SIMULATION VERIFICATION:  The patient underwent CT imaging on the treatment unit.  These were carefully aligned to document that the ablative radiation dose would cover the target volume and maximally spare the nearby organs at risk according to the planned distribution.  SPECIAL TREATMENT PROCEDURE: Stephanie Fletcher received high dose ablative stereotactic body radiotherapy to the planned target volume without unforeseen complications. Treatment was delivered uneventfully. The high doses associated with stereotactic body radiotherapy and the significant potential risks require careful treatment set up and patient monitoring constituting a special treatment procedure   STEREOTACTIC TREATMENT MANAGEMENT:  Following delivery, the patient was evaluated clinically. The patient tolerated treatment without significant acute effects, and was discharged to home in stable condition.    PLAN: Continue treatment as planned.  ________________________________  Artist Pais. Kathrynn Running, M.D.

## 2013-01-03 LAB — AFB CULTURE WITH SMEAR (NOT AT ARMC): Acid Fast Smear: NONE SEEN

## 2013-01-04 ENCOUNTER — Ambulatory Visit
Admission: RE | Admit: 2013-01-04 | Discharge: 2013-01-04 | Disposition: A | Discharge status: Home / Self Care | Payer: Medicare Other | Source: Ambulatory Visit | Attending: Radiation Oncology | Admitting: Radiation Oncology

## 2013-01-04 LAB — AFB CULTURE WITH SMEAR (NOT AT ARMC): Acid Fast Smear: NONE SEEN

## 2013-01-07 ENCOUNTER — Ambulatory Visit: Payer: Medicare Other | Admitting: Radiation Oncology

## 2013-01-08 ENCOUNTER — Ambulatory Visit
Admission: RE | Admit: 2013-01-08 | Discharge: 2013-01-08 | Disposition: A | Discharge status: Home / Self Care | Payer: Medicare Other | Source: Ambulatory Visit | Attending: Radiation Oncology | Admitting: Radiation Oncology

## 2013-01-09 ENCOUNTER — Ambulatory Visit: Payer: Medicare Other | Admitting: Radiation Oncology

## 2013-01-10 ENCOUNTER — Encounter: Payer: Self-pay | Admitting: Radiation Oncology

## 2013-01-10 ENCOUNTER — Ambulatory Visit
Admission: RE | Admit: 2013-01-10 | Discharge: 2013-01-10 | Disposition: A | Discharge status: Home / Self Care | Payer: Medicare Other | Source: Ambulatory Visit | Attending: Radiation Oncology | Admitting: Radiation Oncology

## 2013-01-10 NOTE — Progress Notes (Signed)
   Department of Radiation Oncology  Phone:  [phone] Fax:        [phone]  Weekly Treatment Note    Name: Stephanie Fletcher Date: 01/10/2013 MRN: [id number] DOB: May 12, 1929   Current dose: 60 Gy  Current fraction:5   MEDICATIONS: Current Outpatient Prescriptions  Medication Sig Dispense Refill  . aspirin EC 81 MG tablet Take 81 mg by mouth at bedtime.      . cyanocobalamin (,VITAMIN B-12,) 1000 MCG/ML injection Inject 1,000 mcg into the muscle every 30 (thirty) days.      [name] dicyclomine (BENTYL) 20 MG tablet Take 20 mg by mouth every 6 (six) hours as needed (for stomach).      . metFORMIN (GLUCOPHAGE) 500 MG tablet Take 500-1,000 mg by mouth 3 (three) times daily. Take 2 tablets (1000 mg) every morning, 1 tablet (500 mg) at noon, 1 tablet (500 mg) every evening      . Multiple Vitamins-Minerals (PRESERVISION AREDS 2 PO) Take 2 mg by mouth 2 (two) times daily.      [name] omeprazole (PRILOSEC) 20 MG capsule Take 20 mg by mouth 2 (two) times daily as needed (acid reflux).       . pioglitazone (ACTOS) 15 MG tablet Take 15 mg by mouth every morning.       . polyethylene glycol (MIRALAX / GLYCOLAX) packet Take 17 g by mouth daily as needed (constipation).      . pravastatin (PRAVACHOL) 20 MG tablet Take 20 mg by mouth at bedtime.      . sitaGLIPtin (JANUVIA) 100 MG tablet Take 100 mg by mouth daily.      . valsartan-hydrochlorothiazide (DIOVAN HCT) 160-25 MG per tablet Take 1 tablet by mouth every morning.      . zolpidem (AMBIEN) 5 MG tablet Take 5 mg by mouth at bedtime as needed for sleep.       No current facility-administered medications for this encounter.     ALLERGIES: Fosamax   LABORATORY DATA:  Lab Results  Component Value Date   WBC 5.4 12/04/2012   HGB 12.0 12/04/2012   HCT 35.9* 12/04/2012   MCV 88.6 12/04/2012   PLT 276 12/04/2012   Lab Results  Component Value Date   NA 137 11/20/2012   K 4.3 11/20/2012   CL 102 11/20/2012   CO2 27 11/20/2012   Lab Results    Component Value Date   ALT 10 11/20/2012   AST 11 11/20/2012   ALKPHOS 57 11/20/2012   BILITOT 0.2* 11/20/2012     NARRATIVE: Stephanie Fletcher was seen today for weekly treatment management. The chart was checked and the patient's films were reviewed. Patient finished her final fraction today. She states that she has done very well. No changes that she is aware of. No GI toxicity.  PHYSICAL EXAMINATION: vitals were not taken for this visit.     alert, in no acute distress  ASSESSMENT: The patient did satisfactorily with treatment.  PLAN: The patient will return in one month for followup.

## 2013-01-30 ENCOUNTER — Ambulatory Visit: Payer: Self-pay | Admitting: Unknown Physician Specialty

## 2013-02-03 NOTE — Progress Notes (Signed)
  Radiation Oncology         [phone] ________________________________  Name: Stephanie Fletcher MRN: [id number]  Date: 12/27/2012  DOB: 01/20/1929  RESPIRATORY MOTION MANAGEMENT SIMULATION  NARRATIVE:  In order to account for effect of respiratory motion on target structures and other organs in the planning and delivery of radiotherapy, this patient underwent respiratory motion management simulation.  To accomplish this, when the patient was brought to the CT simulation planning suite, 4D respiratoy motion management CT images were obtained.  The CT images were loaded into the planning software.  Then, using a variety of tools including Cine, MIP, and standard views, the target volume and planning target volumes (PTV) were delineated.  Avoidance structures were contoured.  Treatment planning then occurred.  Dose volume histograms were generated and reviewed for each of the requested structure.  The resulting plan was carefully reviewed and approved today.   ------------------------------------------------  Radene Gunning, MD, PhD

## 2013-02-03 NOTE — Progress Notes (Signed)
  Radiation Oncology         [phone] ________________________________  Name: Stephanie Fletcher MRN: [id number]  Date: 01/10/2013  DOB: [birth year]   Simulation verification note  The patient underwent film verification for the patient's set-up in preparation for stereotactic body radiosurgery. The patient was placed on the treatment unit and a CT scan was performed. These images were then fused with the patient's planning CT scan. The fusion was carefully reviewed in terms of the patient's anatomy as it related to the planning CT scan. The target structures as well as the organs at risk were evaluated on the patient's treatment CT scan. The target and the normal structures were appropriately aligned for treatment. Therefore the patient proceeded with the fraction of stereotactic body radiosurgery.  Fraction: 5  Dose:  60 Gy   ________________________________  Radene Gunning, MD, PhD

## 2013-02-03 NOTE — Addendum Note (Signed)
Encounter addended by: Jonna Coup, MD on: 02/03/2013 10:13 PM<BR>     Documentation filed: Notes Section

## 2013-02-03 NOTE — Progress Notes (Signed)
  Radiation Oncology         [phone] ________________________________  Name: Stephanie Fletcher MRN: [id number]  Date: 01/10/2013  DOB: 19-Aug-1929  End of Treatment Note  Diagnosis:   Lung cancer     Indication for treatment:  Curative       Radiation treatment dates:   12/31/2012 through 01/10/2013  Site/dose:   The patient was treated to the tumor within the left lower lung to a dose of 60 gray in 5 fractions at 12 gray per fraction. This consisted of a stereotactic body radiotherapy technique using 2 customized fields.  Narrative: The patient tolerated radiation treatment relatively well.   The patient did not exhibit any acute toxicity during treatment. No GI toxicity and no worsening shortness of breath.  Plan: The patient has completed radiation treatment. The patient will return to radiation oncology clinic for routine followup in one month. I advised the patient to call or return sooner if they have any questions or concerns related to their recovery or treatment. ________________________________  Radene Gunning, M.D., Ph.D.

## 2013-02-03 NOTE — Progress Notes (Signed)
  Radiation Oncology         [phone] ________________________________  Name: Stephanie Fletcher MRN: [id number]  Date: 01/04/2013  DOB: 1928-12-30   Simulation verification note  The patient underwent film verification for the patient's set-up in preparation for stereotactic body radiosurgery. The patient was placed on the treatment unit and a CT scan was performed. These images were then fused with the patient's planning CT scan. The fusion was carefully reviewed in terms of the patient's anatomy as it related to the planning CT scan. The target structures as well as the organs at risk were evaluated on the patient's treatment CT scan. The target and the normal structures were appropriately aligned for treatment. Therefore the patient proceeded with the fraction of stereotactic body radiosurgery.  Fraction: 3  Dose:  36 Gy   ________________________________  Radene Gunning, MD, PhD

## 2013-02-12 ENCOUNTER — Encounter: Payer: Self-pay | Admitting: Oncology

## 2013-02-13 ENCOUNTER — Ambulatory Visit
Admission: RE | Admit: 2013-02-13 | Discharge: 2013-02-13 | Disposition: A | Discharge status: Home / Self Care | Payer: Medicare Other | Source: Ambulatory Visit | Attending: Radiation Oncology | Admitting: Radiation Oncology

## 2013-02-13 NOTE — Progress Notes (Signed)
Radiation Oncology         [phone] ________________________________  Name: Stephanie Fletcher MRN: [id number]  Date: 02/13/2013  DOB: 1928-12-11  Follow-Up Visit Note  CC: Yetta Flock, MD  Loreli Slot, *  Diagnosis:   Early stage non-small cell lung cancer of the left lower lobe  Interval Since Last Radiation:  One month   Narrative:  The patient returns today for routine follow-up.  The patient completed stereotactic body radiotherapy to a lesion within the left lower lung one month ago. She has done well since that time. She denies any chest discomfort, esophagitis, or worsening shortness of breath. No skin irritation. She does have an occasional cough which he goes to bed at night.                              ALLERGIES:  is allergic to fosamax.  Meds: Current Outpatient Prescriptions  Medication Sig Dispense Refill  . aspirin EC 81 MG tablet Take 81 mg by mouth at bedtime.      . cyanocobalamin (,VITAMIN B-12,) 1000 MCG/ML injection Inject 1,000 mcg into the muscle every 30 (thirty) days.      . metFORMIN (GLUCOPHAGE) 500 MG tablet Take 500-1,000 mg by mouth 3 (three) times daily. Take 2 tablets (1000 mg) every morning, 1 tablet (500 mg) at noon, 1 tablet (500 mg) every evening      . Multiple Vitamins-Minerals (PRESERVISION AREDS 2 PO) Take 2 mg by mouth 2 (two) times daily.      [name] omeprazole (PRILOSEC) 20 MG capsule Take 20 mg by mouth 2 (two) times daily as needed (acid reflux).       . pioglitazone (ACTOS) 15 MG tablet Take 15 mg by mouth every morning.       . polyethylene glycol (MIRALAX / GLYCOLAX) packet Take 17 g by mouth daily as needed (constipation).      . pravastatin (PRAVACHOL) 20 MG tablet Take 20 mg by mouth at bedtime.      . sitaGLIPtin (JANUVIA) 100 MG tablet Take 100 mg by mouth daily.      . valsartan-hydrochlorothiazide (DIOVAN HCT) 160-25 MG per tablet Take 1 tablet by mouth every morning.      . zolpidem (AMBIEN) 5 MG tablet Take 5 mg by  mouth at bedtime as needed for sleep.      [name] dicyclomine (BENTYL) 20 MG tablet Take 20 mg by mouth every 6 (six) hours as needed (for stomach).       No current facility-administered medications for this encounter.    Physical Findings: The patient is in no acute distress. Patient is alert and oriented.  height is 5\' 6"  (1.676 m) and weight is 145 lb (65.772 kg). Her temperature is 98.7 F (37.1 C). Her blood pressure is 123/47 and her pulse is 73. Her oxygen saturation is 94%. .   General: Well-developed, in no acute distress HEENT: Normocephalic, atraumatic Cardiovascular: Regular rate and rhythm Respiratory: Clear to auscultation bilaterally GI: Soft, nontender, normal bowel sounds Extremities: No edema present   Lab Findings: Lab Results  Component Value Date   WBC 5.4 12/04/2012   HGB 12.0 12/04/2012   HCT 35.9* 12/04/2012   MCV 88.6 12/04/2012   PLT 276 12/04/2012     Radiographic Findings: No results found.  Impression:    The patient is doing well 1 month after completing stereotactic body radiotherapy to an early left lower lung tumor. No  signs of acute toxicity or persistent symptoms.  Plan:  The patient will proceed with a CT scan of the chest in 3 months, then followup appointment in our clinic.   Radene Gunning, M.D., Ph.D.

## 2013-02-13 NOTE — Progress Notes (Signed)
Stephanie Fletcher here in a wheel chair with her sister in law for follow up after receiving 60 gray to her left lower lung.  She denies pain, nausea, fatigue and shortness of breath.  She does have a dry cough when she first goes to bed at night.

## 2013-02-14 ENCOUNTER — Telehealth: Payer: Self-pay | Admitting: *Deleted

## 2013-02-14 ENCOUNTER — Ambulatory Visit: Payer: Medicare Other | Admitting: Radiation Oncology

## 2013-02-14 NOTE — Telephone Encounter (Signed)
CALLED PATIENT TO INFORM OF TEST AND FU VISIT FOR SEPT. 2014, SPOKE WITH PATIENT AND SHE IS AWARE OF THESE APPTS.

## 2013-04-03 ENCOUNTER — Ambulatory Visit: Payer: Self-pay | Admitting: Gastroenterology

## 2013-04-10 NOTE — Progress Notes (Signed)
Surgery scheduled for 04/22/13.  Need orders in EPIC.  Thank You. Preop on 04/18/13 at 200pm.

## 2013-04-11 ENCOUNTER — Encounter (HOSPITAL_COMMUNITY): Payer: Self-pay | Admitting: Pharmacy Technician

## 2013-04-18 ENCOUNTER — Encounter (HOSPITAL_COMMUNITY)
Admission: RE | Admit: 2013-04-18 | Discharge: 2013-04-18 | Disposition: A | Discharge status: Home / Self Care | Payer: Medicare Other | Source: Ambulatory Visit | Attending: Orthopedic Surgery | Admitting: Orthopedic Surgery

## 2013-04-18 ENCOUNTER — Encounter (HOSPITAL_COMMUNITY): Payer: Self-pay

## 2013-04-18 DIAGNOSIS — Z01812 Encounter for preprocedural laboratory examination: Secondary | ICD-10-CM | POA: Insufficient documentation

## 2013-04-18 DIAGNOSIS — Z0183 Encounter for blood typing: Secondary | ICD-10-CM | POA: Insufficient documentation

## 2013-04-18 HISTORY — DX: Malignant (primary) neoplasm, unspecified: C80.1

## 2013-04-18 HISTORY — DX: Peripheral vascular disease, unspecified: I73.9

## 2013-04-18 LAB — PROTIME-INR: INR: 0.95 (ref 0.00–1.49)

## 2013-04-18 LAB — SURGICAL PCR SCREEN: MRSA, PCR: NEGATIVE

## 2013-04-18 LAB — COMPREHENSIVE METABOLIC PANEL
ALT: 12 U/L (ref 0–35)
AST: 14 U/L (ref 0–37)
Albumin: 3.4 g/dL — ABNORMAL LOW (ref 3.5–5.2)
Calcium: 9.3 mg/dL (ref 8.4–10.5)
Creatinine, Ser: 0.99 mg/dL (ref 0.50–1.10)
Sodium: 137 mEq/L (ref 135–145)
Total Protein: 6.9 g/dL (ref 6.0–8.3)

## 2013-04-18 LAB — URINALYSIS, ROUTINE W REFLEX MICROSCOPIC
Bilirubin Urine: NEGATIVE
Leukocytes, UA: NEGATIVE
Nitrite: NEGATIVE
Specific Gravity, Urine: 1.031 — ABNORMAL HIGH (ref 1.005–1.030)
Urobilinogen, UA: 1 mg/dL (ref 0.0–1.0)

## 2013-04-18 LAB — CBC
MCH: 27.7 pg (ref 26.0–34.0)
MCV: 89.1 fL (ref 78.0–100.0)
Platelets: 312 10*3/uL (ref 150–400)
RDW: 13.2 % (ref 11.5–15.5)

## 2013-04-18 LAB — APTT: aPTT: 32 seconds (ref 24–37)

## 2013-04-18 NOTE — Patient Instructions (Addendum)
20 Stephanie Fletcher  04/18/2013   Your procedure is scheduled on:  04/22/13  MONDAY  Report to [facility] at  0830     AM.  Call this number if you have problems the morning of surgery: [phone]       Remember:   Do not eat food  Or drink :After Midnight. Sunday NIGHT   Take these medicines the morning of surgery with A SIP OF WATER:  PROLISEC DO NOT TAKE ANY DIABETES MEDICATION MORNING OF SURGERY  .  Contacts, dentures or partial plates can not be worn to surgery  Leave suitcase in the car. After surgery it may be brought to your room.  For patients admitted to the hospital, checkout time is 11:00 AM day of  discharge.             SPECIAL INSTRUCTIONS- SEE [facility] PREPARING FOR SURGERY INSTRUCTION SHEET-     DO NOT WEAR JEWELRY, LOTIONS, POWDERS, OR PERFUMES.  WOMEN-- DO NOT SHAVE LEGS OR UNDERARMS FOR 12 HOURS BEFORE SHOWERS. MEN MAY SHAVE FACE.  Patients discharged the day of surgery will not be allowed to drive home. IF going home the day of surgery, you must have a driver and someone to stay with you for the first 24 hours  Name and phone number of your driver:                                                                        Please read over the following fact sheets that you were given: MRSA Information, Incentive Spirometry Sheet, Blood Transfusion Sheet  Information                                                                                   [name]  PST [phone]  [phone]                 FAILURE TO FOLLOW THESE INSTRUCTIONS MAY RESULT IN  CANCELLATION   OF YOUR SURGERY                                                  Patient Signature _____________________________

## 2013-04-18 NOTE — Progress Notes (Signed)
NEED PRE OP ORDERS PLEASE--SURGERY 04/22/13  THANKS

## 2013-04-18 NOTE — Progress Notes (Signed)
Chest x ray 3/14 EPIC,  Clearance Dr Sanjuana Letters onc- on chart. Requested EKG from 12/13,5/14 with LOV REQUESTED [facility] INTERNAL MEDICINE [phone]

## 2013-04-19 NOTE — Progress Notes (Signed)
Received LOV 6/14 Mimi McLaughlin PA, EKG 4/14,nucleur stress 6/14  ON CHART.   Patient notified of positive PCR- staph- verified with her the Mupirocin ointment she has at home was correct- instructed her to begin today, as directed.  Faxed abnormal CMET and PCR to Dr Lequita Halt through [facility]

## 2013-04-20 ENCOUNTER — Other Ambulatory Visit: Payer: Self-pay | Admitting: Orthopedic Surgery

## 2013-04-21 ENCOUNTER — Other Ambulatory Visit: Payer: Self-pay | Admitting: Orthopedic Surgery
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
## 2013-04-22 ENCOUNTER — Encounter (HOSPITAL_COMMUNITY): Payer: Self-pay | Admitting: Anesthesiology

## 2013-04-22 ENCOUNTER — Inpatient Hospital Stay (HOSPITAL_COMMUNITY)
Admission: RE | Admit: 2013-04-22 | Discharge: 2013-04-25 | DRG: 470 | Disposition: A | Discharge status: Skilled Nursing Facility | Payer: Medicare Other | Source: Ambulatory Visit | Attending: Orthopedic Surgery | Admitting: Orthopedic Surgery

## 2013-04-22 ENCOUNTER — Inpatient Hospital Stay (HOSPITAL_COMMUNITY): Payer: Medicare Other | Admitting: Anesthesiology

## 2013-04-22 ENCOUNTER — Encounter (HOSPITAL_COMMUNITY)
Admission: RE | Disposition: A | Discharge status: Skilled Nursing Facility | Payer: Self-pay | Source: Ambulatory Visit | Attending: Orthopedic Surgery

## 2013-04-22 ENCOUNTER — Encounter (HOSPITAL_COMMUNITY): Payer: Self-pay | Admitting: *Deleted

## 2013-04-22 DIAGNOSIS — Z85118 Personal history of other malignant neoplasm of bronchus and lung: Secondary | ICD-10-CM

## 2013-04-22 DIAGNOSIS — M179 Osteoarthritis of knee, unspecified: Secondary | ICD-10-CM | POA: Diagnosis present

## 2013-04-22 DIAGNOSIS — E7800 Pure hypercholesterolemia, unspecified: Secondary | ICD-10-CM | POA: Diagnosis present

## 2013-04-22 DIAGNOSIS — Z96652 Presence of left artificial knee joint: Secondary | ICD-10-CM

## 2013-04-22 DIAGNOSIS — Z8719 Personal history of other diseases of the digestive system: Secondary | ICD-10-CM

## 2013-04-22 DIAGNOSIS — M1710 Unilateral primary osteoarthritis, unspecified knee: Principal | ICD-10-CM | POA: Diagnosis present

## 2013-04-22 DIAGNOSIS — K219 Gastro-esophageal reflux disease without esophagitis: Secondary | ICD-10-CM | POA: Diagnosis present

## 2013-04-22 DIAGNOSIS — D62 Acute posthemorrhagic anemia: Secondary | ICD-10-CM | POA: Clinically undetermined

## 2013-04-22 DIAGNOSIS — M21372 Foot drop, left foot: Secondary | ICD-10-CM

## 2013-04-22 DIAGNOSIS — E871 Hypo-osmolality and hyponatremia: Secondary | ICD-10-CM | POA: Diagnosis not present

## 2013-04-22 DIAGNOSIS — I10 Essential (primary) hypertension: Secondary | ICD-10-CM | POA: Diagnosis present

## 2013-04-22 DIAGNOSIS — E119 Type 2 diabetes mellitus without complications: Secondary | ICD-10-CM | POA: Diagnosis present

## 2013-04-22 DIAGNOSIS — Z9289 Personal history of other medical treatment: Secondary | ICD-10-CM

## 2013-04-22 DIAGNOSIS — M216X9 Other acquired deformities of unspecified foot: Secondary | ICD-10-CM | POA: Diagnosis not present

## 2013-04-22 HISTORY — PX: TOTAL KNEE ARTHROPLASTY: SHX125

## 2013-04-22 LAB — GLUCOSE, CAPILLARY: Glucose-Capillary: 108 mg/dL — ABNORMAL HIGH (ref 70–99)

## 2013-04-22 SURGERY — ARTHROPLASTY, KNEE, TOTAL
Anesthesia: Spinal | Site: Knee | Laterality: Left | Wound class: Clean

## 2013-04-22 MED ORDER — DICYCLOMINE HCL 20 MG PO TABS
20.0000 mg | ORAL_TABLET | Freq: Four times a day (QID) | ORAL | Status: DC | PRN
  Filled 2013-04-22: qty 1

## 2013-04-22 MED ORDER — CEFAZOLIN SODIUM 1-5 GM-% IV SOLN
1.0000 g | Freq: Four times a day (QID) | INTRAVENOUS | Status: AC
  Administered 2013-04-22 – 2013-04-23 (×2): 1 g via INTRAVENOUS
  Filled 2013-04-22 (×2): qty 50

## 2013-04-22 MED ORDER — FLEET ENEMA 7-19 GM/118ML RE ENEM: 1.0000 | ENEMA | Freq: Once | RECTAL | Status: AC | PRN

## 2013-04-22 MED ORDER — BUPIVACAINE LIPOSOME 1.3 % IJ SUSP
20.0000 mL | Freq: Once | INTRAMUSCULAR | Status: DC
Start: 2013-04-22 — End: 2013-04-22
  Filled 2013-04-22: qty 20

## 2013-04-22 MED ORDER — CEFAZOLIN SODIUM-DEXTROSE 2-3 GM-% IV SOLR
2.0000 g | INTRAVENOUS | Status: AC
  Administered 2013-04-22: 2 g via INTRAVENOUS

## 2013-04-22 MED ORDER — METFORMIN HCL 500 MG PO TABS
1000.0000 mg | ORAL_TABLET | Freq: Every day | ORAL | Status: DC
  Administered 2013-04-23 – 2013-04-25 (×3): 1000 mg via ORAL
  Filled 2013-04-22 (×4): qty 2

## 2013-04-22 MED ORDER — LACTATED RINGERS IV SOLN
INTRAVENOUS | Status: DC
  Administered 2013-04-22: 13:00:00 via INTRAVENOUS
  Administered 2013-04-22: 1000 mL via INTRAVENOUS
  Administered 2013-04-22 (×2): via INTRAVENOUS

## 2013-04-22 MED ORDER — ZOLPIDEM TARTRATE 5 MG PO TABS: 5.0000 mg | ORAL_TABLET | Freq: Every evening | ORAL | Status: DC | PRN

## 2013-04-22 MED ORDER — HYDROCHLOROTHIAZIDE 25 MG PO TABS
25.0000 mg | ORAL_TABLET | Freq: Every day | ORAL | Status: DC
  Administered 2013-04-24: 25 mg via ORAL
  Filled 2013-04-22 (×3): qty 1

## 2013-04-22 MED ORDER — DEXAMETHASONE SODIUM PHOSPHATE 10 MG/ML IJ SOLN: 10.0000 mg | Freq: Once | INTRAMUSCULAR | Status: DC

## 2013-04-22 MED ORDER — PROMETHAZINE HCL 25 MG/ML IJ SOLN: 6.2500 mg | INTRAMUSCULAR | Status: DC | PRN

## 2013-04-22 MED ORDER — METOCLOPRAMIDE HCL 10 MG PO TABS: 5.0000 mg | ORAL_TABLET | Freq: Three times a day (TID) | ORAL | Status: DC | PRN

## 2013-04-22 MED ORDER — PROPOFOL 10 MG/ML IV BOLUS
INTRAVENOUS | Status: DC | PRN
  Administered 2013-04-22: 25 mg via INTRAVENOUS

## 2013-04-22 MED ORDER — DOCUSATE SODIUM 100 MG PO CAPS
100.0000 mg | ORAL_CAPSULE | Freq: Two times a day (BID) | ORAL | Status: DC
  Administered 2013-04-22 – 2013-04-25 (×6): 100 mg via ORAL

## 2013-04-22 MED ORDER — MORPHINE SULFATE 2 MG/ML IJ SOLN
1.0000 mg | INTRAMUSCULAR | Status: DC | PRN
  Administered 2013-04-22 – 2013-04-23 (×4): 2 mg via INTRAVENOUS
  Administered 2013-04-23: 1 mg via INTRAVENOUS
  Filled 2013-04-22 (×5): qty 1

## 2013-04-22 MED ORDER — PANTOPRAZOLE SODIUM 40 MG PO TBEC
40.0000 mg | DELAYED_RELEASE_TABLET | Freq: Every day | ORAL | Status: DC
  Administered 2013-04-22: 40 mg via ORAL
  Filled 2013-04-22 (×2): qty 1

## 2013-04-22 MED ORDER — DEXAMETHASONE 6 MG PO TABS
10.0000 mg | ORAL_TABLET | Freq: Every day | ORAL | Status: AC
  Administered 2013-04-23: 10 mg via ORAL
  Filled 2013-04-22: qty 1

## 2013-04-22 MED ORDER — TRAMADOL HCL 50 MG PO TABS
50.0000 mg | ORAL_TABLET | Freq: Four times a day (QID) | ORAL | Status: DC | PRN
  Administered 2013-04-23: 100 mg via ORAL
  Filled 2013-04-22: qty 2

## 2013-04-22 MED ORDER — DEXTROSE 50 % IV SOLN
25.0000 mL | Freq: Once | INTRAVENOUS | Status: AC | PRN
  Administered 2013-04-22: 25 mL via INTRAVENOUS

## 2013-04-22 MED ORDER — LINAGLIPTIN 5 MG PO TABS
5.0000 mg | ORAL_TABLET | Freq: Every day | ORAL | Status: DC
  Administered 2013-04-23 – 2013-04-25 (×3): 5 mg via ORAL
  Filled 2013-04-22 (×3): qty 1

## 2013-04-22 MED ORDER — RIVAROXABAN 10 MG PO TABS
10.0000 mg | ORAL_TABLET | Freq: Every day | ORAL | Status: DC
  Administered 2013-04-23 – 2013-04-25 (×3): 10 mg via ORAL
  Filled 2013-04-22 (×4): qty 1

## 2013-04-22 MED ORDER — ONDANSETRON HCL 4 MG/2ML IJ SOLN
4.0000 mg | Freq: Four times a day (QID) | INTRAMUSCULAR | Status: DC | PRN
  Administered 2013-04-23 (×2): 4 mg via INTRAVENOUS
  Filled 2013-04-22 (×2): qty 2

## 2013-04-22 MED ORDER — PROPOFOL INFUSION 10 MG/ML OPTIME
INTRAVENOUS | Status: DC | PRN
  Administered 2013-04-22: 160 ug/kg/min via INTRAVENOUS

## 2013-04-22 MED ORDER — BUPIVACAINE HCL 0.25 % IJ SOLN
INTRAMUSCULAR | Status: DC | PRN
  Administered 2013-04-22: 20 mL

## 2013-04-22 MED ORDER — DIPHENHYDRAMINE HCL 12.5 MG/5ML PO ELIX: 12.5000 mg | ORAL_SOLUTION | ORAL | Status: DC | PRN

## 2013-04-22 MED ORDER — BISACODYL 10 MG RE SUPP: 10.0000 mg | Freq: Every day | RECTAL | Status: DC | PRN

## 2013-04-22 MED ORDER — METHOCARBAMOL 500 MG PO TABS
500.0000 mg | ORAL_TABLET | Freq: Four times a day (QID) | ORAL | Status: DC | PRN
  Administered 2013-04-23 – 2013-04-25 (×6): 500 mg via ORAL
  Filled 2013-04-22 (×8): qty 1

## 2013-04-22 MED ORDER — PHENOL 1.4 % MT LIQD: 1.0000 | OROMUCOSAL | Status: DC | PRN

## 2013-04-22 MED ORDER — DEXAMETHASONE SODIUM PHOSPHATE 10 MG/ML IJ SOLN
10.0000 mg | Freq: Every day | INTRAMUSCULAR | Status: AC
  Filled 2013-04-22: qty 1

## 2013-04-22 MED ORDER — ACETAMINOPHEN 500 MG PO TABS
1000.0000 mg | ORAL_TABLET | Freq: Once | ORAL | Status: AC
  Administered 2013-04-22: 1000 mg via ORAL
  Filled 2013-04-22: qty 2

## 2013-04-22 MED ORDER — POLYETHYLENE GLYCOL 3350 17 G PO PACK: 17.0000 g | PACK | Freq: Every day | ORAL | Status: DC | PRN

## 2013-04-22 MED ORDER — SODIUM CHLORIDE 0.9 % IV SOLN
INTRAVENOUS | Status: DC
  Administered 2013-04-22: 19:00:00 via INTRAVENOUS

## 2013-04-22 MED ORDER — METOCLOPRAMIDE HCL 5 MG/ML IJ SOLN
5.0000 mg | Freq: Three times a day (TID) | INTRAMUSCULAR | Status: DC | PRN
  Administered 2013-04-23: 10 mg via INTRAVENOUS
  Filled 2013-04-22: qty 2

## 2013-04-22 MED ORDER — MIDAZOLAM HCL 5 MG/5ML IJ SOLN
INTRAMUSCULAR | Status: DC | PRN
  Administered 2013-04-22: 2 mg via INTRAVENOUS

## 2013-04-22 MED ORDER — CHLORHEXIDINE GLUCONATE 4 % EX LIQD: 60.0000 mL | Freq: Once | CUTANEOUS | Status: DC

## 2013-04-22 MED ORDER — SODIUM CHLORIDE 0.9 % IV SOLN
INTRAVENOUS | Status: DC
Start: 2013-04-22 — End: 2013-04-22

## 2013-04-22 MED ORDER — METHOCARBAMOL 100 MG/ML IJ SOLN
500.0000 mg | Freq: Four times a day (QID) | INTRAVENOUS | Status: DC | PRN
  Administered 2013-04-22: 500 mg via INTRAVENOUS
  Filled 2013-04-22: qty 5

## 2013-04-22 MED ORDER — IRBESARTAN 150 MG PO TABS
150.0000 mg | ORAL_TABLET | Freq: Every day | ORAL | Status: DC
  Administered 2013-04-24: 150 mg via ORAL
  Filled 2013-04-22 (×3): qty 1

## 2013-04-22 MED ORDER — 0.9 % SODIUM CHLORIDE (POUR BTL) OPTIME
TOPICAL | Status: DC | PRN
  Administered 2013-04-22: 1000 mL

## 2013-04-22 MED ORDER — FENTANYL CITRATE 0.05 MG/ML IJ SOLN
INTRAMUSCULAR | Status: DC | PRN
  Administered 2013-04-22: 50 ug via INTRAVENOUS

## 2013-04-22 MED ORDER — ACETAMINOPHEN 500 MG PO TABS
1000.0000 mg | ORAL_TABLET | Freq: Four times a day (QID) | ORAL | Status: AC
  Administered 2013-04-22: 1000 mg via ORAL
  Filled 2013-04-22: qty 1

## 2013-04-22 MED ORDER — OXYCODONE HCL 5 MG PO TABS
5.0000 mg | ORAL_TABLET | ORAL | Status: DC | PRN
  Administered 2013-04-22 – 2013-04-25 (×15): 10 mg via ORAL
  Filled 2013-04-22 (×16): qty 2

## 2013-04-22 MED ORDER — SODIUM CHLORIDE 0.9 % IJ SOLN
INTRAMUSCULAR | Status: DC | PRN
  Administered 2013-04-22: 13:00:00

## 2013-04-22 MED ORDER — HYDROMORPHONE HCL PF 1 MG/ML IJ SOLN: 0.2500 mg | INTRAMUSCULAR | Status: DC | PRN

## 2013-04-22 MED ORDER — ONDANSETRON HCL 4 MG PO TABS
4.0000 mg | ORAL_TABLET | Freq: Four times a day (QID) | ORAL | Status: DC | PRN
  Administered 2013-04-25: 4 mg via ORAL
  Filled 2013-04-22: qty 1

## 2013-04-22 MED ORDER — VALSARTAN-HYDROCHLOROTHIAZIDE 160-25 MG PO TABS: 1.0000 | ORAL_TABLET | Freq: Every morning | ORAL | Status: DC

## 2013-04-22 MED ORDER — PIOGLITAZONE HCL 15 MG PO TABS
15.0000 mg | ORAL_TABLET | Freq: Every day | ORAL | Status: DC
  Administered 2013-04-23 – 2013-04-25 (×3): 15 mg via ORAL
  Filled 2013-04-22 (×4): qty 1

## 2013-04-22 MED ORDER — INSULIN ASPART 100 UNIT/ML ~~LOC~~ SOLN
0.0000 [IU] | Freq: Three times a day (TID) | SUBCUTANEOUS | Status: DC
  Administered 2013-04-23: 5 [IU] via SUBCUTANEOUS
  Administered 2013-04-23 (×2): 3 [IU] via SUBCUTANEOUS
  Administered 2013-04-24 – 2013-04-25 (×2): 2 [IU] via SUBCUTANEOUS
  Administered 2013-04-25: 12:00:00 via SUBCUTANEOUS

## 2013-04-22 MED ORDER — KETOROLAC TROMETHAMINE 15 MG/ML IJ SOLN
7.5000 mg | Freq: Four times a day (QID) | INTRAMUSCULAR | Status: AC | PRN
  Administered 2013-04-22: 7.5 mg via INTRAVENOUS
  Filled 2013-04-22: qty 1

## 2013-04-22 MED ORDER — MENTHOL 3 MG MT LOZG: 1.0000 | LOZENGE | OROMUCOSAL | Status: DC | PRN

## 2013-04-22 MED ORDER — METFORMIN HCL 500 MG PO TABS
500.0000 mg | ORAL_TABLET | ORAL | Status: DC
  Administered 2013-04-23 – 2013-04-25 (×5): 500 mg via ORAL
  Filled 2013-04-22 (×6): qty 1

## 2013-04-22 MED ORDER — SODIUM CHLORIDE 0.9 % IR SOLN
Status: DC | PRN
  Administered 2013-04-22: 1000 mL

## 2013-04-22 SURGICAL SUPPLY — 55 items
BAG ZIPLOCK 12X15 (MISCELLANEOUS) IMPLANT
BANDAGE ELASTIC 6 VELCRO ST LF (GAUZE/BANDAGES/DRESSINGS) ×2 IMPLANT
BANDAGE ESMARK 6X9 LF (GAUZE/BANDAGES/DRESSINGS) ×1 IMPLANT
BLADE SAG 18X100X1.27 (BLADE) ×2 IMPLANT
BLADE SAW SGTL 11.0X1.19X90.0M (BLADE) ×2 IMPLANT
BNDG ESMARK 6X9 LF (GAUZE/BANDAGES/DRESSINGS) ×2
BOWL SMART MIX CTS (DISPOSABLE) ×2 IMPLANT
CAPT RP KNEE ×2 IMPLANT
CEMENT HV SMART SET (Cement) ×4 IMPLANT
CLOTH BEACON ORANGE TIMEOUT ST (SAFETY) ×2 IMPLANT
CUFF TOURN SGL QUICK 34 (TOURNIQUET CUFF) ×1
CUFF TRNQT CYL 34X4X40X1 (TOURNIQUET CUFF) ×1 IMPLANT
DECANTER SPIKE VIAL GLASS SM (MISCELLANEOUS) ×2 IMPLANT
DRAPE EXTREMITY T 121X128X90 (DRAPE) ×2 IMPLANT
DRAPE POUCH INSTRU U-SHP 10X18 (DRAPES) ×2 IMPLANT
DRAPE U-SHAPE 47X51 STRL (DRAPES) ×2 IMPLANT
DRSG ADAPTIC 3X8 NADH LF (GAUZE/BANDAGES/DRESSINGS) ×2 IMPLANT
DRSG PAD ABDOMINAL 8X10 ST (GAUZE/BANDAGES/DRESSINGS) ×2 IMPLANT
DURAPREP 26ML APPLICATOR (WOUND CARE) ×2 IMPLANT
ELECT REM PT RETURN 9FT ADLT (ELECTROSURGICAL) ×2
ELECTRODE REM PT RTRN 9FT ADLT (ELECTROSURGICAL) ×1 IMPLANT
EVACUATOR 1/8 PVC DRAIN (DRAIN) ×2 IMPLANT
FACESHIELD LNG OPTICON STERILE (SAFETY) ×8 IMPLANT
GLOVE BIO SURGEON STRL SZ8 (GLOVE) ×2 IMPLANT
GLOVE BIOGEL PI IND STRL 8 (GLOVE) ×1 IMPLANT
GLOVE BIOGEL PI INDICATOR 8 (GLOVE) ×1
GLOVE SURG SS PI 6.5 STRL IVOR (GLOVE) ×4 IMPLANT
GOWN STRL NON-REIN LRG LVL3 (GOWN DISPOSABLE) ×4 IMPLANT
GOWN STRL REIN XL XLG (GOWN DISPOSABLE) ×4 IMPLANT
HANDPIECE INTERPULSE COAX TIP (DISPOSABLE) ×1
HOOD PEEL AWAY FACE SHEILD DIS (HOOD) ×2 IMPLANT
IMMOBILIZER KNEE 20 (SOFTGOODS) ×2
IMMOBILIZER KNEE 20 THIGH 36 (SOFTGOODS) ×1 IMPLANT
KIT BASIN OR (CUSTOM PROCEDURE TRAY) ×2 IMPLANT
MANIFOLD NEPTUNE II (INSTRUMENTS) ×2 IMPLANT
NDL SAFETY ECLIPSE 18X1.5 (NEEDLE) ×2 IMPLANT
NEEDLE HYPO 18GX1.5 SHARP (NEEDLE) ×2
NS IRRIG 1000ML POUR BTL (IV SOLUTION) ×2 IMPLANT
PACK TOTAL JOINT (CUSTOM PROCEDURE TRAY) ×2 IMPLANT
PADDING CAST COTTON 6X4 STRL (CAST SUPPLIES) ×6 IMPLANT
POSITIONER SURGICAL ARM (MISCELLANEOUS) ×2 IMPLANT
SET HNDPC FAN SPRY TIP SCT (DISPOSABLE) ×1 IMPLANT
SPONGE GAUZE 4X4 12PLY (GAUZE/BANDAGES/DRESSINGS) ×2 IMPLANT
STRIP CLOSURE SKIN 1/2X4 (GAUZE/BANDAGES/DRESSINGS) ×4 IMPLANT
SUCTION FRAZIER 12FR DISP (SUCTIONS) ×2 IMPLANT
SUT MNCRL AB 4-0 PS2 18 (SUTURE) ×2 IMPLANT
SUT VIC AB 2-0 CT1 27 (SUTURE) ×3
SUT VIC AB 2-0 CT1 TAPERPNT 27 (SUTURE) ×3 IMPLANT
SUT VLOC 180 0 24IN GS25 (SUTURE) ×2 IMPLANT
SYR 20CC LL (SYRINGE) ×2 IMPLANT
SYR 50ML LL SCALE MARK (SYRINGE) ×2 IMPLANT
TOWEL OR 17X26 10 PK STRL BLUE (TOWEL DISPOSABLE) ×2 IMPLANT
TRAY FOLEY CATH 14FRSI W/METER (CATHETERS) ×2 IMPLANT
WATER STERILE IRR 1500ML POUR (IV SOLUTION) ×4 IMPLANT
WRAP KNEE MAXI GEL POST OP (GAUZE/BANDAGES/DRESSINGS) ×2 IMPLANT

## 2013-04-22 NOTE — Progress Notes (Signed)
Utilization review completed.  

## 2013-04-22 NOTE — Op Note (Signed)
Pre-operative diagnosis- Osteoarthritis Left knee(s)  Post-operative diagnosis- Osteoarthritis  Left knee(s)  Procedure-   Left Total Knee Arthroplasty  Surgeon- Gus Rankin. Peityn Payton, MD  Assistant- Dimitri Ped, PA-C   Anesthesia-  Spinal   EBL- * No blood loss amount entered *   Drains Hemovac   Tourniquet time  Total Tourniquet Time Documented: Thigh (Left) - 32 minutes Total: Thigh (Left) - 32 minutes    Complications- None  Condition-PACU - hemodynamically stable.   Brief Clinical Note  Stephanie Fletcher is a 79 y.o. year old female with end stage OA of her left knee with progressively worsening pain and dysfunction. She has constant pain, with activity and at rest and significant functional deficits with difficulties even with ADLs. She has had extensive non-op management including analgesics, injections of cortisone and viscosupplements, and home exercise program, but remains in significant pain with significant dysfunction. Radiographs show bone on bone arthritis lateral and patellofemoral with large valgus deformity greater than 20 degrees. She presents now for left Total Knee Arthroplasty.   Procedure in detail---       The patient is brought into the operating room and positioned supine on the operating table. After successful administration of Spinal anesthetic, a tourniquet is placed high on the Left thigh(s) and the lower extremity is prepped and draped in the usual sterile fashion. Time out is performed by the operating team and then the Left  lower extremity is wrapped in Esmarch, knee flexed and the tourniquet inflated to 300 mmHg.       A midline incision is made with a ten blade through the subcutaneous tissue to the level of the extensor mechanism. A fresh blade is used to make a lateral parapatellar arthrotomy due to the patients' valgus deformity. Soft tissue over the proximal lateral tibia is subperiosteally elevated to the joint line with a knife to the  posterolateral corner but not including the structures of the posterolateral corner. Soft tissue over the proximal medial tibia is elevated with attention being paid to avoiding the patellar tendon on the tibial tubercle. The patella is everted medially, knee flexed 90 degrees and the ACL and PCL are removed. Findings are bone on bone lateral and patellofemoral with large lateral osteophytes. .       The drill is used to create a starting hole in the distal femur and the canal is thoroughly irrigated with sterile saline to remove the fatty contents. The 5 degree Left  valgus alignment guide is placed into the femoral canal and the distal femoral cutting block is pinned to remove 10  mm off the distal femur. Resection is made with an oscillating saw.      The tibia is subluxed forward and the menisci are removed. The extramedullary alignment guide is placed referencing proximally at the medial aspect of the tibial tubercle and distally along the second metatarsal axis and tibial crest. The block is pinned to remove 2mm off the more deficient lateral side. Resection is made with an oscillating saw. Size 3  is the most appropriate size for the tibia and the proximal tibia is prepared with the modular drill and keel punch for that size.      The femoral sizing guide is placed and size 4  Narrow is most appropriate. Rotation is marked off the epicondylar axis and confirmed by creating a rectangular flexion gap at 90 degrees. The size 4  cutting block is pinned in this rotation and the anterior, posterior and chamfer cuts are made  with the oscillating saw. The intercondylar block is then placed and that cut is made.      Trial size 3  tibial component, trial size 4  Narrow posterior stabilized femur and a 10  mm posterior stabilized rotating platform insert trial is placed. Full extension is achieved with excellent varus/valgus and   anterior/posterior balance throughout full range of motion. The patella is everted  and thickness measured to be 22  mm. Free hand resection is taken to 12 mm, a 35 template is placed, lug holes are drilled, trial patella is placed, and it tracks normally. Osteophytes are removed off the posterior femur with the trial in place. All trials are removed and the cut bone surfaces prepared with pulsatile lavage. Cement is mixed and once ready for implantation, the size 3  tibial implant, size 4 narrow posterior stabilized femoral component, and the size 35  patella are cemented in place and the patella is held with the clamp. The trial insert is placed and the knee held in full extension. The Exparel (20 ml mixed with 30 ml saline) and then 20 ml of .25% Bupivicaine is injected into the extensor mechanism, posterior capsule, medial and lateral gutters and subcutaneous tissues. All extruded cement is removed and once the cement is hard the permanent 10  mm posterior stabilized rotating platform insert is placed into the tibial tray.      The wound is copiously irrigated with saline solution and the tourniquet is released for a total   tourniquet time of 31  minutes. Bleeding is identified and controlled with electrocautery. The extensor mechanism is closed with interrupted #1 PDS leaving open a small area from the superior to inferior pole of the patella to serve as a mini lateral release. Flexion against gravity is 140  degrees and the patella tracks normally. Subcutaneous tissue is closed with 2.0 vicryl and subcuticular with running 4.0 Monocryl.The incision is cleaned and dried and steri-strips and a bulky sterile dressing are applied. The limb is placed into a knee immobilizer and the patient is awakened and transported to recovery in stable condition.      Please note that a surgical assistant was a medical necessity for this procedure in order to perform it in a safe and expeditious manner. Surgical assistant was necessary to retract the ligaments and vital neurovascular structures to prevent  injury to them and also necessary for proper positioning of the limb to allow for anatomic placement of the prosthesis.    Gus Rankin Deante Blough, MD    04/22/2013, 1:00 PM

## 2013-04-22 NOTE — Anesthesia Preprocedure Evaluation (Signed)
Anesthesia Evaluation  Patient identified by MRN, date of birth, ID band Patient awake    Reviewed: Allergy & Precautions, H&P , NPO status , Patient's Chart, lab work & pertinent test results  Airway Mallampati: II TM Distance: >3 FB Neck ROM: Full    Dental no notable dental hx.    Pulmonary neg pulmonary ROS,  breath sounds clear to auscultation  Pulmonary exam normal       Cardiovascular hypertension, Pt. on medications Rhythm:Regular Rate:Normal     Neuro/Psych negative neurological ROS  negative psych ROS   GI/Hepatic Neg liver ROS, GERD-  Medicated,  Endo/Other  diabetes, Type 2  Renal/GU negative Renal ROS  negative genitourinary   Musculoskeletal negative musculoskeletal ROS (+)   Abdominal   Peds negative pediatric ROS (+)  Hematology negative hematology ROS (+)   Anesthesia Other Findings   Reproductive/Obstetrics negative OB ROS                           Anesthesia Physical Anesthesia Plan  ASA: III  Anesthesia Plan: Spinal   Post-op Pain Management:    Induction: Intravenous  Airway Management Planned: Simple Face Mask  Additional Equipment:   Intra-op Plan:   Post-operative Plan:   Informed Consent: I have reviewed the patients History and Physical, chart, labs and discussed the procedure including the risks, benefits and alternatives for the proposed anesthesia with the patient or authorized representative who has indicated his/her understanding and acceptance.     Plan Discussed with: CRNA and Surgeon  Anesthesia Plan Comments:         Anesthesia Quick Evaluation

## 2013-04-22 NOTE — Transfer of Care (Signed)
Immediate Anesthesia Transfer of Care Note  Patient: Stephanie Fletcher  Procedure(s) Performed: Procedure(s): LEFT TOTAL KNEE ARTHROPLASTY (Left)  Patient Location: PACU  Anesthesia Type:Regional and Spinal  Level of Consciousness: awake, oriented, sedated and patient cooperative  Airway & Oxygen Therapy: Patient Spontanous Breathing and Patient connected to face mask oxygen  Post-op Assessment: Report given to PACU RN and Post -op Vital signs reviewed and stable  Post vital signs: Reviewed and stable  Complications: No apparent anesthesia complications

## 2013-04-22 NOTE — Anesthesia Procedure Notes (Signed)

## 2013-04-22 NOTE — Interval H&P Note (Signed)
History and Physical Interval Note:  04/22/2013 9:51 AM  Stephanie Fletcher  has presented today for surgery, with the diagnosis of Left knee Osteoarthritis  The various methods of treatment have been discussed with the patient and family. After consideration of risks, benefits and other options for treatment, the patient has consented to  Procedure(s): LEFT TOTAL KNEE ARTHROPLASTY (Left) as a surgical intervention .  The patient's history has been reviewed, patient examined, no change in status, stable for surgery.  I have reviewed the patient's chart and labs.  Questions were answered to the patient's satisfaction.     Loanne Drilling

## 2013-04-22 NOTE — Preoperative (Signed)
Beta Blockers   Reason not to administer Beta Blockers:Not Applicable 
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
## 2013-04-22 NOTE — Progress Notes (Signed)
PACU Nursing Note: CBG assessed, results: 61mg /dl, 1/2 amp of Z61 administered IV, will repeat CBG after 

## 2013-04-23 ENCOUNTER — Encounter (HOSPITAL_COMMUNITY): Payer: Self-pay | Admitting: Orthopedic Surgery

## 2013-04-23 DIAGNOSIS — D62 Acute posthemorrhagic anemia: Secondary | ICD-10-CM | POA: Clinically undetermined

## 2013-04-23 DIAGNOSIS — Z9289 Personal history of other medical treatment: Secondary | ICD-10-CM

## 2013-04-23 DIAGNOSIS — E871 Hypo-osmolality and hyponatremia: Secondary | ICD-10-CM | POA: Diagnosis not present

## 2013-04-23 LAB — BASIC METABOLIC PANEL
CO2: 26 mEq/L (ref 19–32)
Calcium: 8.3 mg/dL — ABNORMAL LOW (ref 8.4–10.5)
Glucose, Bld: 282 mg/dL — ABNORMAL HIGH (ref 70–99)
Potassium: 4.3 mEq/L (ref 3.5–5.1)
Sodium: 133 mEq/L — ABNORMAL LOW (ref 135–145)

## 2013-04-23 LAB — CBC
Hemoglobin: 7.6 g/dL — ABNORMAL LOW (ref 12.0–15.0)
MCH: 27.9 pg (ref 26.0–34.0)
Platelets: 211 10*3/uL (ref 150–400)
RBC: 2.72 MIL/uL — ABNORMAL LOW (ref 3.87–5.11)
WBC: 4.6 10*3/uL (ref 4.0–10.5)

## 2013-04-23 LAB — GLUCOSE, CAPILLARY
Glucose-Capillary: 307 mg/dL — ABNORMAL HIGH (ref 70–99)
Glucose-Capillary: 172 mg/dL — ABNORMAL HIGH (ref 70–99)

## 2013-04-23 MED ORDER — NON FORMULARY: 20.0000 mg | Freq: Two times a day (BID) | Status: DC | PRN

## 2013-04-23 MED ORDER — OMEPRAZOLE 20 MG PO CPDR
20.0000 mg | DELAYED_RELEASE_CAPSULE | Freq: Every day | ORAL | Status: DC
  Administered 2013-04-23 – 2013-04-25 (×3): 20 mg via ORAL
  Filled 2013-04-23 (×3): qty 1

## 2013-04-23 MED ORDER — FUROSEMIDE 10 MG/ML IJ SOLN
20.0000 mg | Freq: Once | INTRAMUSCULAR | Status: AC
  Administered 2013-04-23: 20 mg via INTRAVENOUS
  Filled 2013-04-23: qty 2

## 2013-04-23 NOTE — Progress Notes (Signed)
   Subjective: 1 Day Post-Op Procedure(s) (LRB): LEFT TOTAL KNEE ARTHROPLASTY (Left) - Lateral Approach Patient reports pain as mild.   Patient seen in rounds with Dr. Lequita Halt. Patient is well, and has had no acute complaints or problems We will start therapy today.  Plan is to go Skilled nursing facility - [facility] in [place] after hospital stay.  Objective: Vital signs in last 24 hours: Temp:  [97.5 F (36.4 C)-98.5 F (36.9 C)] 97.9 F (36.6 C) (08/12 0600) Pulse Rate:  [53-77] 70 (08/12 0600) Resp:  [14-20] 18 (08/12 0737) BP: (103-161)/(41-77) 115/70 mmHg (08/12 0600) SpO2:  [97 %-100 %] 97 % (08/12 0737) Weight:  [65.772 kg (145 lb)] 65.772 kg (145 lb) (08/11 1525)  Intake/Output from previous day:  Intake/Output Summary (Last 24 hours) at 04/23/13 0828 Last data filed at 04/23/13 0740  Gross per 24 hour  Intake 4803.75 ml  Output   2310 ml  Net 2493.75 ml    Intake/Output this shift: Total I/O In: 100 [P.O.:100] Out: -   Labs:  Recent Labs  04/23/13 0423  HGB 7.6*    Recent Labs  04/23/13 0423  WBC 4.6  RBC 2.72*  HCT 23.8*  PLT 211    Recent Labs  04/23/13 0423  NA 133*  K 4.3  CL 102  CO2 26  BUN 20  CREATININE 0.83  GLUCOSE 282*  CALCIUM 8.3*   No results found for this basename: LABPT, INR,  in the last 72 hours  EXAM General - Patient is Alert, Appropriate and Oriented Extremity - Neurovascular intact Sensation intact distally No Dorsiflexion (foot drop) Dressing - dressing C/D/I Motor Function - intact, moving foot and toes well on exam.  Hemovac pulled without difficulty.  Past Medical History  Diagnosis Date  . Measles   . Mumps   . Diabetes mellitus without complication     type 2  . Hypercholesterolemia   . Hypertension   . Diverticulitis   . Gastric ulcer   . Arthritis   . Anemia   . Back injury 1998    T-12 fracture (brace)  . Nodule of left lung   . GERD (gastroesophageal reflux disease)   . History  of radiation therapy 12/31/2012-01/10/2013    60 gray to left lower lung  . Peripheral vascular disease 1957/1990    DVT bilateral following fall  . Cancer     adenocarcinoma/  lung    Assessment/Plan: 1 Day Post-Op Procedure(s) (LRB): LEFT TOTAL KNEE ARTHROPLASTY (Left) Principal Problem:   OA (osteoarthritis) of knee Active Problems:   Postoperative anemia due to acute blood loss   Hyponatremia   Postop Transfusion   Estimated body mass index is 23.41 kg/(m^2) as calculated from the following:   Height as of this encounter: 5\' 6"  (1.676 m).   Weight as of this encounter: 65.772 kg (145 lb). Advance diet Up with therapy Discharge to SNF AFO for the right foot Blood Today  DVT Prophylaxis - Xarelto Weight-Bearing as tolerated to left leg No vaccines. D/C O2 and Pulse OX and try on Room [address]  Patrica Duel 04/23/2013, 8:28 AM

## 2013-04-23 NOTE — Progress Notes (Signed)
Physical Therapy Treatment Patient Details Name: Stephanie Fletcher MRN: [id number] DOB: 01/30/1929 Today's Date: 04/23/2013 Time: 4098-1191 PT Time Calculation (min): 23 min  PT Assessment / Plan / Recommendation  History of Present Illness POD #1 for L TKA, postop L foot drop   PT Comments   *Pt fatigued and requested assist with returning to bed. Pt took a few shuffling/pivotal steps with RW from recliner to bed. L TKA exercises performed with assist. L foot drop presists. AFO being ordered. **  Follow Up Recommendations  SNF     Does the patient have the potential to tolerate intense rehabilitation     Barriers to Discharge        Equipment Recommendations  Other (comment) (L AFO)    Recommendations for Other Services    Frequency 7X/week   Progress towards PT Goals Progress towards PT goals: Progressing toward goals  Plan Current plan remains appropriate    Precautions / Restrictions Precautions Precautions: Knee Precaution Comments: AFO for L foot Restrictions Weight Bearing Restrictions: No Other Position/Activity Restrictions: WBAT   Pertinent Vitals/Pain **6/10 L knee Premedicated, ice applied*    Mobility  Bed Mobility Bed Mobility: Supine to Sit;Sit to Supine Supine to Sit: 3: Mod assist Sit to Supine: 4: Min assist Details for Bed Mobility Assistance: min A for LLE Transfers Transfers: Sit to Stand;Stand to Sit Sit to Stand: 4: Min assist;From chair/3-in-1;From elevated surface;With upper extremity assist Stand to Sit: 3: Mod assist;4: Min guard;To bed;With upper extremity assist Details for Transfer Assistance: assist to rise, VCs hand placement Ambulation/Gait Ambulation/Gait Assistance: 4: Min assist Ambulation Distance (Feet): 2 Feet Assistive device: Rolling walker Ambulation/Gait Assistance Details: narrow BOS, shuffling/pivotal steps from recliner to bed Gait Pattern: Step-to pattern;Narrow base of support;Decreased step length - left;Decreased  stance time - right;Decreased dorsiflexion - left    Exercises Total Joint Exercises Ankle Circles/Pumps: PROM;Left;10 reps;Supine Quad Sets: AROM;Both;10 reps;Supine Short Arc Quad: AAROM;Left;10 reps;Supine Heel Slides: AAROM;Left;10 reps;Supine Hip ABduction/ADduction: AAROM;Left;10 reps;Supine Straight Leg Raises: AAROM;Left;10 reps;Supine Goniometric ROM: -10-45* L knee AAROM   PT Diagnosis: Difficulty walking;Acute pain  PT Problem List: Decreased strength;Decreased range of motion;Decreased activity tolerance;Decreased mobility;Pain;Decreased knowledge of use of DME;Impaired sensation PT Treatment Interventions: DME instruction;Gait training;Functional mobility training;Therapeutic activities;Therapeutic exercise;Patient/family education;Neuromuscular re-education   PT Goals (current goals can now be found in the care plan section) Acute Rehab PT Goals Patient Stated Goal: to walk PT Goal Formulation: With patient Time For Goal Achievement: 05/07/13 Potential to Achieve Goals: Good  Visit Information  Last PT Received On: 04/23/13 Assistance Needed: +1 History of Present Illness: POD #1 for L TKA, postop L foot drop    Subjective Data  Patient Stated Goal: to walk   Cognition  Cognition Arousal/Alertness: Awake/alert Behavior During Therapy: WFL for tasks assessed/performed Overall Cognitive Status: Within Functional Limits for tasks assessed    Balance     End of Session PT - End of Session Equipment Utilized During Treatment: Gait belt Activity Tolerance: Patient limited by fatigue Patient left: with call bell/phone within reach;with family/visitor present;in bed Nurse Communication: Mobility status   GP     Ralene Bathe Kistler 04/23/2013, 1:27 PM [phone]

## 2013-04-23 NOTE — Evaluation (Addendum)
Physical Therapy Evaluation Patient Details Name: Stephanie Fletcher MRN: [id number] DOB: [birth year] Today's Date: 04/23/2013 Time: 4098-1191 PT Time Calculation (min): 28 min  PT Assessment / Plan / Recommendation History of Present Illness  POD #1 for L TKA, postop L foot drop  Clinical Impression  *Pt with Hgb of 7.6 this morning, receiving 1st of 2 units of blood. Mod assist for OOB to recliner. L foot drop noted. AFO recommended. Good progress expected. **    PT Assessment  Patient needs continued PT services    Follow Up Recommendations  SNF    Does the patient have the potential to tolerate intense rehabilitation      Barriers to Discharge        Equipment Recommendations  L Ankle Foot Orthosis for foot drop    Recommendations for Other Services     Frequency 7X/week    Precautions / Restrictions Precautions Precautions: Knee Precaution Comments: AFO for L foot Restrictions Weight Bearing Restrictions: No Other Position/Activity Restrictions: WBAT   Pertinent Vitals/Pain **6/10 L knee Premedicated, ice applied*      Mobility  Bed Mobility Bed Mobility: Supine to Sit Supine to Sit: 3: Mod assist Details for Bed Mobility Assistance: assist to elevate trunk and support LLE Transfers Transfers: Sit to Stand;Stand to Sit Sit to Stand: 3: Mod assist;From bed Stand to Sit: 3: Mod assist;To chair/3-in-1 Details for Transfer Assistance: assist to rise Ambulation/Gait Ambulation/Gait Assistance: 3: Mod assist Ambulation Distance (Feet): 3 Feet Assistive device: Rolling walker Ambulation/Gait Assistance Details: heavy use of BUEs with RW when advancing RLE, very narrow BOS, VCs to widen BOS Gait Pattern: Step-to pattern;Narrow base of support;Decreased step length - left;Decreased stance time - right    Exercises Total Joint Exercises Ankle Circles/Pumps: PROM;Left;10 reps;Supine Quad Sets: AROM;Both;10 reps;Supine Heel Slides: AAROM;Left;10  reps;Supine Goniometric ROM: -10-45* L knee AAROM   PT Diagnosis: Difficulty walking;Acute pain  PT Problem List: Decreased strength;Decreased range of motion;Decreased activity tolerance;Decreased mobility;Pain;Decreased knowledge of use of DME;Impaired sensation PT Treatment Interventions: DME instruction;Gait training;Functional mobility training;Therapeutic activities;Therapeutic exercise;Patient/family education;Neuromuscular re-education     PT Goals(Current goals can be found in the care plan section) Acute Rehab PT Goals Patient Stated Goal: to walk PT Goal Formulation: With patient Time For Goal Achievement: 05/07/13 Potential to Achieve Goals: Good  Visit Information  Last PT Received On: 04/23/13 Assistance Needed: +1 History of Present Illness: POD #1 for L TKA, postop L foot drop       Prior Functioning  Home Living Family/patient expects to be discharged to:: Private residence Living Arrangements: Alone Available Help at Discharge: Skilled Nursing Facility Type of Home: Independent living facility Home Access: Level entry Home Layout: One level Home Equipment: Environmental consultant - 2 wheels;Cane - single point;Grab bars - tub/shower;Grab bars - toilet Additional Comments: independent living at [facility], plans to DC to SNF there. One bathroom has elevated commode.  Prior Function Level of Independence: Independent with assistive device(s) Comments: used cane at home, RW when going out. No falls at home. Communication Communication: No difficulties    Cognition  Cognition Arousal/Alertness: Awake/alert Behavior During Therapy: WFL for tasks assessed/performed Overall Cognitive Status: Within Functional Limits for tasks assessed    Extremity/Trunk Assessment Upper Extremity Assessment Upper Extremity Assessment: Overall WFL for tasks assessed Lower Extremity Assessment Lower Extremity Assessment: LLE deficits/detail LLE Deficits / Details: New L foot drop, sensation to  light touch absent medial left foot, knee flexion 45* AAROM, ext -10*, weak quad contraction LLE Sensation: decreased light touch  Cervical / Trunk Assessment Cervical / Trunk Assessment: Normal   Balance    End of Session PT - End of Session Equipment Utilized During Treatment: Gait belt Activity Tolerance: Patient tolerated treatment well Patient left: in chair;with call bell/phone within reach;with family/visitor present Nurse Communication: Mobility status  GP     Ralene Bathe Kistler 04/23/2013, 12:30 PM [phone]

## 2013-04-23 NOTE — Progress Notes (Signed)
Clinical Social Work Department BRIEF PSYCHOSOCIAL ASSESSMENT 04/23/2013  Patient:  Stephanie Fletcher, Stephanie Fletcher     Account Number:  [id number]     Admit date:  04/22/2013  Clinical Social Worker:  Candie Chroman  Date/Time:  04/23/2013 01:29 PM  Referred by:  Physician  Date Referred:  04/23/2013 Referred for  SNF Placement   Other Referral:   Interview type:  Patient Other interview type:    PSYCHOSOCIAL DATA Living Status:  ALONE Admitted from facility:  [facility] Level of care:  Independent Living Primary support name:  Jesse Fall Primary support relationship to patient:   Degree of support available:   unclear    CURRENT CONCERNS Current Concerns  Post-Acute Placement   Other Concerns:    SOCIAL WORK ASSESSMENT / PLAN Pt is an 79 yr old female living at [facility] independent Community prior to hospitalization. CSW met with pt to assist with d/c planning. Pt plans to return to [facility] ( SNF ) for rehab before returning to her independent apt. SNF contacted and d/c plan confirmed. CSW will continue to follow to assist with d/c planning to SNF.   Assessment/plan status:  Psychosocial Support/Ongoing Assessment of Needs Other assessment/ plan:   Information/referral to community resources:   Insurance coverage for SNF and ambulance transport reviewed . Pt is aware insurance may not cover cost of transport.    PATIENT'S/FAMILY'S RESPONSE TO PLAN OF CARE: Pt is looking forward to having rehab at [facility].   Cori Razor LCSW [phone]

## 2013-04-23 NOTE — Care Management Note (Signed)
    Page 1 of 1   04/23/2013     3:42:38 PM   CARE MANAGEMENT NOTE 04/23/2013  Patient:  Stephanie Fletcher, Stephanie Fletcher   Account Number:  [id number]  Date Initiated:  04/23/2013  Documentation initiated by:  Colleen Can  Subjective/Objective Assessment:   dx left knee osteoarthritis; total knee replacemnt     Action/Plan:   SNF rehab   Anticipated DC Date:  04/25/2013   Anticipated DC Plan:  SKILLED NURSING FACILITY  In-house referral  Clinical Social Worker      DC Planning Services  CM consult      Choice offered to / List presented to:             Status of service:  Completed, signed off Medicare Important Message given?  NA - LOS <3 / Initial given by admissions (If response is "NO", the following Medicare IM given date fields will be blank) Date Medicare IM given:   Date Additional Medicare IM given:    Discharge Disposition:    Per UR Regulation:    If discussed at Long Length of Stay Meetings, dates discussed:    Comments:

## 2013-04-23 NOTE — Progress Notes (Signed)
OT Cancellation Note  Patient Details Name: Stephanie Fletcher MRN: [id number] DOB: [birth year]   Cancelled Treatment:    Reason Eval/Treat Not Completed: Medical issues which prohibited therapy. Pt with 7.6 hgb, receiving blood.  Will continue to follow.  Evern Bio 04/23/2013, 1:25 PM

## 2013-04-23 NOTE — Progress Notes (Signed)
Clinical Social Work Department CLINICAL SOCIAL WORK PLACEMENT NOTE 04/23/2013  Patient:  Stephanie Fletcher, Stephanie Fletcher  Account Number:  [id number] Admit date:  04/22/2013  Clinical Social Worker:  Cori Razor, LCSW  Date/time:  04/23/2013 01:50 PM  Clinical Social Work is seeking post-discharge placement for this patient at the following level of care:   SKILLED NURSING   (*CSW will update this form in Epic as items are completed)     Patient/family provided with [facility] Department of Clinical Social Work's list of facilities offering this level of care within the geographic area requested by the patient (or if unable, by the patient's family).  04/23/2013  Patient/family informed of their freedom to choose among providers that offer the needed level of care, that participate in Medicare, Medicaid or managed care program needed by the patient, have an available bed and are willing to accept the patient.    Patient/family informed of [facility]' ownership interest in [facility], as well as of the fact that they are under no obligation to receive care at this facility.  PASARR submitted to EDS on  PASARR number received from EDS on 11/05/2012  FL2 transmitted to all facilities in geographic area requested by pt/family on  04/23/2013 FL2 transmitted to all facilities within larger geographic area on   Patient informed that his/her managed care company has contracts with or will negotiate with  certain facilities, including the following:     Patient/family informed of bed offers received:  04/23/2013 Patient chooses bed at [facility] Physician recommends and patient chooses bed at    Patient to be transferred to [facility] on   Patient to be transferred to facility by   The following physician request were entered in Epic:   Additional Comments:  Cori Razor LCSW [phone]

## 2013-04-24 DIAGNOSIS — M21372 Foot drop, left foot: Secondary | ICD-10-CM | POA: Diagnosis not present

## 2013-04-24 LAB — TYPE AND SCREEN
ABO/RH(D): O POS
Antibody Screen: NEGATIVE
Unit division: 0

## 2013-04-24 LAB — BASIC METABOLIC PANEL
BUN: 16 mg/dL (ref 6–23)
Chloride: 102 mEq/L (ref 96–112)
Creatinine, Ser: 0.79 mg/dL (ref 0.50–1.10)
Glucose, Bld: 176 mg/dL — ABNORMAL HIGH (ref 70–99)
Potassium: 4.1 mEq/L (ref 3.5–5.1)

## 2013-04-24 LAB — GLUCOSE, CAPILLARY
Glucose-Capillary: 122 mg/dL — ABNORMAL HIGH (ref 70–99)
Glucose-Capillary: 114 mg/dL — ABNORMAL HIGH (ref 70–99)
Glucose-Capillary: 121 mg/dL — ABNORMAL HIGH (ref 70–99)
Glucose-Capillary: 173 mg/dL — ABNORMAL HIGH (ref 70–99)

## 2013-04-24 LAB — CBC
HCT: 30.2 % — ABNORMAL LOW (ref 36.0–46.0)
Hemoglobin: 9.7 g/dL — ABNORMAL LOW (ref 12.0–15.0)
MCHC: 32.1 g/dL (ref 30.0–36.0)
MCV: 86.5 fL (ref 78.0–100.0)
RDW: 13.6 % (ref 11.5–15.5)

## 2013-04-24 NOTE — Progress Notes (Signed)
Physical Therapy Treatment Patient Details Name: Stephanie Fletcher MRN: [id number] DOB: 27-Jan-1929 Today's Date: 04/24/2013 Time: 5784-6962 PT Time Calculation (min): 39 min  PT Assessment / Plan / Recommendation  History of Present Illness POD #1 for L TKA, postop L foot drop   PT Comments   Pt reports too tired to ambulate. Plans for SNF tomorrow.  Follow Up Recommendations  SNF     Does the patient have the potential to tolerate intense rehabilitation     Barriers to Discharge        Equipment Recommendations       Recommendations for Other Services    Frequency 7X/week   Progress towards PT Goals Progress towards PT goals: Progressing toward goals  Plan Current plan remains appropriate    Precautions / Restrictions Precautions Precautions: Knee;Fall Precaution Comments: AFO for L foot Required Braces or Orthoses: Knee Immobilizer - Left Restrictions Weight Bearing Restrictions: No Other Position/Activity Restrictions: WBAT   Pertinent Vitals/Pain Requested Pain meds, pain is mild.    Mobility  Bed Mobility Bed Mobility: Supine to Sit Supine to Sit: 3: Mod assist Sit to Supine: 4: Min assist Details for Bed Mobility Assistance: min A for LLE Transfers Sit to Stand: With upper extremity assist;With armrests;From chair/3-in-1;3: Mod assist Sit to Stand: Patient Percentage: 70% Stand to Sit: 3: Mod assist;To bed;To chair/3-in-1;With upper extremity assist;With armrests Stand to Sit: Patient Percentage: 80% Details for Transfer Assistance: assist to rise, VCs hand placement Ambulation/Gait Ambulation/Gait Assistance: 3: Mod assist Ambulation Distance (Feet): 5 Feet Assistive device: Rolling walker Ambulation/Gait Assistance Details: Pt stepped backward to get to bed after on BSC. Gait Pattern: Step-to pattern    Exercises Total Joint Exercises Ankle Circles/Pumps: PROM;Left;10 reps;Supine Quad Sets: AROM;Both;10 reps;Supine Heel Slides: AAROM;Left;10  reps;Supine Hip ABduction/ADduction: AAROM;Left;10 reps;Supine Straight Leg Raises: AAROM;Left;10 reps;Supine Goniometric ROM: 10-45   PT Diagnosis:    PT Problem List:   PT Treatment Interventions:     PT Goals (current goals can now be found in the care plan section) Acute Rehab PT Goals Patient Stated Goal: to walk  Visit Information  Last PT Received On: 04/24/13 Assistance Needed: +1 History of Present Illness: POD #1 for L TKA, postop L foot drop    Subjective Data  Patient Stated Goal: to walk   Cognition  Cognition Arousal/Alertness: Awake/alert Behavior During Therapy: WFL for tasks assessed/performed Overall Cognitive Status: Within Functional Limits for tasks assessed    Balance     End of Session PT - End of Session Equipment Utilized During Treatment: Gait belt Activity Tolerance: Patient tolerated treatment well Patient left: with call bell/phone within reach;with family/visitor present;in bed Nurse Communication: Mobility status   GP     Stephanie Fletcher, Stephanie Fletcher 04/24/2013, 4:29 PM

## 2013-04-24 NOTE — Evaluation (Signed)
Occupational Therapy Evaluation Patient Details Name: Stephanie Fletcher MRN: [id number] DOB: Oct 06, 1928 Today's Date: 04/24/2013 Time: 4098-1191 OT Time Calculation (min): 34 min  OT Assessment / Plan / Recommendation History of present illness POD #1 for L TKA, postop L foot drop   Clinical Impression   Pt ambulating for first time this visit.  Requiring assist of 2.  Dependent in ADL.  Will need further rehab prior to return home.  Agree with plan for SNF.  Will defer further OT to SNF.    OT Assessment  All further OT needs can be met in the next venue of care    Follow Up Recommendations  SNF    Barriers to Discharge      Equipment Recommendations  None recommended by OT    Recommendations for Other Services    Frequency       Precautions / Restrictions Precautions Precautions: Knee;Fall Precaution Comments: AFO for L foot Restrictions Weight Bearing Restrictions: No Other Position/Activity Restrictions: WBAT   Pertinent Vitals/Pain No c/o pain, premedicated.    ADL  Eating/Feeding: Independent Where Assessed - Eating/Feeding: Chair Grooming: Wash/dry hands;Wash/dry face;Set up Where Assessed - Grooming: Unsupported sitting Upper Body Bathing: Set up Where Assessed - Upper Body Bathing: Unsupported sitting Lower Body Bathing: +1 Total assistance Where Assessed - Lower Body Bathing: Unsupported sitting;Supported sit to stand Upper Body Dressing: Set up Where Assessed - Upper Body Dressing: Unsupported sitting Lower Body Dressing: +1 Total assistance Where Assessed - Lower Body Dressing: Supine, head of bed up Equipment Used: Gait belt;Rolling walker Transfers/Ambulation Related to ADLs: +2 total assist pt 70 %    OT Diagnosis: Generalized weakness;Acute pain  OT Problem List: Decreased strength;Decreased activity tolerance;Impaired balance (sitting and/or standing);Decreased knowledge of use of DME or AE;Pain OT Treatment Interventions:     OT Goals(Current  goals can be found in the care plan section) Acute Rehab OT Goals Patient Stated Goal: to walk  Visit Information  Last OT Received On: 04/24/13 Assistance Needed: +2 PT/OT Co-Evaluation/Treatment: Yes History of Present Illness: POD #1 for L TKA, postop L foot drop       Prior Functioning     Home Living Family/patient expects to be discharged to:: Skilled nursing facility Home Equipment: Dan Humphreys - 2 wheels;Cane - single point;Grab bars - tub/shower;Grab bars - toilet Additional Comments: independent living at [facility], plans to DC to SNF there. One bathroom has elevated commode.  Prior Function Level of Independence: Independent with assistive device(s) Comments: used cane at home, RW when going out. No falls at home. Communication Communication: No difficulties Dominant Hand: Right         Vision/Perception Vision - History Baseline Vision: Wears glasses all the time   Cognition  Cognition Arousal/Alertness: Awake/alert Behavior During Therapy: WFL for tasks assessed/performed Overall Cognitive Status: Within Functional Limits for tasks assessed    Extremity/Trunk Assessment Upper Extremity Assessment Upper Extremity Assessment: Overall WFL for tasks assessed Lower Extremity Assessment Lower Extremity Assessment: Defer to PT evaluation LLE Deficits / Details: New L foot drop, sensation to light touch absent medial left foot, knee flexion 45* AAROM, ext -10*, weak quad contraction LLE Sensation: decreased light touch Cervical / Trunk Assessment Cervical / Trunk Assessment: Kyphotic     Mobility Bed Mobility Bed Mobility: Supine to Sit Supine to Sit: 3: Mod assist Transfers Transfers: Sit to Stand;Stand to Sit Sit to Stand: With upper extremity assist;From bed;1: +2 Total assist Sit to Stand: Patient Percentage: 70% Stand to Sit: With upper extremity  assist;To chair/3-in-1;1: +2 Total assist Stand to Sit: Patient Percentage: 80% Details for Transfer  Assistance: assist to rise, VCs hand placement     Exercise     Balance     End of Session OT - End of Session Activity Tolerance: Patient tolerated treatment well Patient left: in chair;with call bell/phone within reach;with family/visitor present  GO     Evern Bio 04/24/2013, 3:09 PM [phone]

## 2013-04-24 NOTE — Progress Notes (Signed)
   Subjective: 2 Days Post-Op Procedure(s) (LRB): LEFT TOTAL KNEE ARTHROPLASTY (Left) Patient reports pain as mild.   Patient seen in rounds for Dr. Lequita Halt. Patient is having problems with weakness in her foot. She has th AFO now due to the postop foot drop.  Needs to use when she is up ambulating for safety reasons. Plan is to go Home after hospital stay.  Objective: Vital signs in last 24 hours: Temp:  [97.9 F (36.6 C)-98.4 F (36.9 C)] 97.9 F (36.6 C) (08/13 0602) Pulse Rate:  [73-87] 87 (08/13 0859) Resp:  [14-18] 14 (08/13 1200) BP: (111-146)/(67-82) 111/67 mmHg (08/13 0859) SpO2:  [92 %-98 %] 94 % (08/13 0859)  Intake/Output from previous day:  Intake/Output Summary (Last 24 hours) at 04/24/13 1320 Last data filed at 04/24/13 1300  Gross per 24 hour  Intake 1451.17 ml  Output   2150 ml  Net -698.83 ml    Intake/Output this shift: Total I/O In: 144 [P.O.:144] Out: 250 [Urine:250]  Labs:  Recent Labs  04/23/13 0423 04/24/13 0358  HGB 7.6* 9.7*    Recent Labs  04/23/13 0423 04/24/13 0358  WBC 4.6 5.4  RBC 2.72* 3.49*  HCT 23.8* 30.2*  PLT 211 198    Recent Labs  04/23/13 0423 04/24/13 0358  NA 133* 135  K 4.3 4.1  CL 102 102  CO2 26 27  BUN 20 16  CREATININE 0.83 0.79  GLUCOSE 282* 176*  CALCIUM 8.3* 8.5   No results found for this basename: LABPT, INR,  in the last 72 hours  EXAM General - Patient is Alert, Appropriate and Oriented Extremity - Neurovascular intact Sensation intact distally She has plantarflexion but no active dorsiflexion or EHL motor function Dressing/Incision - clean, dry, no drainage, healing  Past Medical History  Diagnosis Date  . Measles   . Mumps   . Diabetes mellitus without complication     type 2  . Hypercholesterolemia   . Hypertension   . Diverticulitis   . Gastric ulcer   . Arthritis   . Anemia   . Back injury 1998    T-12 fracture (brace)  . Nodule of left lung   . GERD (gastroesophageal  reflux disease)   . History of radiation therapy 12/31/2012-01/10/2013    60 gray to left lower lung  . Peripheral vascular disease 1957/1990    DVT bilateral following fall  . Cancer     adenocarcinoma/  lung    Assessment/Plan: 2 Days Post-Op Procedure(s) (LRB): LEFT TOTAL KNEE ARTHROPLASTY (Left) Principal Problem:   OA (osteoarthritis) of knee Active Problems:   Postoperative anemia due to acute blood loss   Hyponatremia   Postop Transfusion   Left foot drop  Estimated body mass index is 23.41 kg/(m^2) as calculated from the following:   Height as of this encounter: 5\' 6"  (1.676 m).   Weight as of this encounter: 65.772 kg (145 lb). Up with therapy Plan for discharge tomorrow Discharge home with home health HGB back up following transfusion yesterday.  HGB up to 9.7,  DVT Prophylaxis - Xarelto Weight-Bearing as tolerated to left leg  Stephanie Fletcher 04/24/2013, 1:20 PM

## 2013-04-24 NOTE — Progress Notes (Signed)
Physical Therapy Treatment Patient Details Name: Stephanie Fletcher MRN: [id number] DOB: 11/07/1928 Today's Date: 04/24/2013 Time: 4098-1191 PT Time Calculation (min): 32 min  PT Assessment / Plan / Recommendation  History of Present Illness POD #1 for L TKA, postop L foot drop   PT Comments   Pt ambulated with AFO. Will need to have strict skin checks due to decreased sensation in L foot.  Follow Up Recommendations  SNF     Does the patient have the potential to tolerate intense rehabilitation     Barriers to Discharge        Equipment Recommendations       Recommendations for Other Services    Frequency 7X/week   Progress towards PT Goals Progress towards PT goals: Progressing toward goals  Plan Current plan remains appropriate    Precautions / Restrictions Precautions Precautions: Knee;Fall Precaution Comments: AFO for L foot Required Braces or Orthoses: Knee Immobilizer - Left Restrictions Weight Bearing Restrictions: No Other Position/Activity Restrictions: WBAT   Pertinent Vitals/Pain Pt reports knee is sore.    Mobility  Bed Mobility Bed Mobility: Supine to Sit Supine to Sit: 3: Mod assist Transfers Sit to Stand: With upper extremity assist;From bed;1: +2 Total assist Sit to Stand: Patient Percentage: 70% Stand to Sit: With upper extremity assist;To chair/3-in-1;1: +2 Total assist Stand to Sit: Patient Percentage: 80% Details for Transfer Assistance: assist to rise, VCs hand placement Ambulation/Gait Ambulation/Gait Assistance: 1: +2 Total assist Ambulation Distance (Feet): 20 Feet Assistive device: Rolling walker Ambulation/Gait Assistance Details: Ataxia noted, narrow base. AFO on L , pt  did reqire  assist to advance LLE as foot would get stuck Gait Pattern: Step-to pattern;Narrow base of support;Decreased step length - left;Decreased stance time - right;Decreased dorsiflexion - left    Exercises     PT Diagnosis:    PT Problem List:   PT Treatment  Interventions:     PT Goals (current goals can now be found in the care plan section) Acute Rehab PT Goals Patient Stated Goal: to walk  Visit Information  Last PT Received On: 04/24/13 Assistance Needed: +2 History of Present Illness: POD #1 for L TKA, postop L foot drop    Subjective Data  Patient Stated Goal: to walk   Cognition  Cognition Arousal/Alertness: Awake/alert Behavior During Therapy: WFL for tasks assessed/performed Overall Cognitive Status: Within Functional Limits for tasks assessed    Balance     End of Session PT - End of Session Equipment Utilized During Treatment: Gait belt Activity Tolerance: Patient tolerated treatment well Patient left: with call bell/phone within reach;with family/visitor present;in chair Nurse Communication: Mobility status   GP     Stephanie, Fletcher 04/24/2013, 4:23 PM

## 2013-04-25 LAB — CBC
HCT: 28.2 % — ABNORMAL LOW (ref 36.0–46.0)
Hemoglobin: 9.1 g/dL — ABNORMAL LOW (ref 12.0–15.0)
RBC: 3.24 MIL/uL — ABNORMAL LOW (ref 3.87–5.11)

## 2013-04-25 LAB — GLUCOSE, CAPILLARY: Glucose-Capillary: 124 mg/dL — ABNORMAL HIGH (ref 70–99)

## 2013-04-25 MED ORDER — OXYCODONE HCL 5 MG PO TABS: 5.0000 mg | ORAL_TABLET | ORAL | Status: DC | PRN

## 2013-04-25 MED ORDER — RIVAROXABAN 10 MG PO TABS: 10.0000 mg | ORAL_TABLET | Freq: Every day | ORAL | Status: DC

## 2013-04-25 MED ORDER — ONDANSETRON HCL 4 MG PO TABS: 4.0000 mg | ORAL_TABLET | Freq: Four times a day (QID) | ORAL | Status: DC | PRN

## 2013-04-25 MED ORDER — BISACODYL 10 MG RE SUPP: 10.0000 mg | Freq: Every day | RECTAL | Status: DC | PRN

## 2013-04-25 MED ORDER — DIPHENHYDRAMINE HCL 12.5 MG/5ML PO ELIX: 12.5000 mg | ORAL_SOLUTION | ORAL | Status: DC | PRN

## 2013-04-25 MED ORDER — METOCLOPRAMIDE HCL 5 MG PO TABS: 5.0000 mg | ORAL_TABLET | Freq: Three times a day (TID) | ORAL | Status: DC | PRN

## 2013-04-25 MED ORDER — TRAMADOL HCL 50 MG PO TABS: 50.0000 mg | ORAL_TABLET | Freq: Four times a day (QID) | ORAL | Status: DC | PRN

## 2013-04-25 MED ORDER — METHOCARBAMOL 500 MG PO TABS: 500.0000 mg | ORAL_TABLET | Freq: Four times a day (QID) | ORAL | Status: DC | PRN

## 2013-04-25 MED ORDER — DSS 100 MG PO CAPS: 100.0000 mg | ORAL_CAPSULE | Freq: Two times a day (BID) | ORAL | Status: DC

## 2013-04-25 NOTE — Progress Notes (Signed)
Clinical Social Work Department CLINICAL SOCIAL WORK PLACEMENT NOTE 04/25/2013  Patient:  Stephanie Fletcher, Stephanie Fletcher  Account Number:  [id number] Admit date:  04/22/2013  Clinical Social Worker:  Cori Razor, LCSW  Date/time:  04/23/2013 01:50 PM  Clinical Social Work is seeking post-discharge placement for this patient at the following level of care:   SKILLED NURSING   (*CSW will update this form in Epic as items are completed)     Patient/family provided with [facility] Department of Clinical Social Work's list of facilities offering this level of care within the geographic area requested by the patient (or if unable, by the patient's family).  04/23/2013  Patient/family informed of their freedom to choose among providers that offer the needed level of care, that participate in Medicare, Medicaid or managed care program needed by the patient, have an available bed and are willing to accept the patient.    Patient/family informed of [facility]' ownership interest in [facility], as well as of the fact that they are under no obligation to receive care at this facility.  PASARR submitted to EDS on  PASARR number received from EDS on 11/05/2012  FL2 transmitted to all facilities in geographic area requested by pt/family on  04/23/2013 FL2 transmitted to all facilities within larger geographic area on   Patient informed that his/her managed care company has contracts with or will negotiate with  certain facilities, including the following:     Patient/family informed of bed offers received:  04/23/2013 Patient chooses bed at [facility] Physician recommends and patient chooses bed at    Patient to be transferred to [facility] on  04/25/2013 Patient to be transferred to facility by [facility]  The following physician request were entered in Epic:   Additional Comments:  Cori Razor LCSW [phone]

## 2013-04-25 NOTE — Progress Notes (Signed)
Physical Therapy Treatment Patient Details Name: Stephanie Fletcher MRN: [id number] DOB: 03/03/1929 Today's Date: 04/25/2013 Time:  -     PT Assessment / Plan / Recommendation  History of Present Illness  L TKA, postop L foot drop   PT Comments   Pt ambulated much improved today. Pt is complaining of burning of L heel and lower leg over pretibial area- may be from  Foot drop and stretch of muscle.Instructed pt and family on positioning foot and floating heel. Pt stated positioning decreased pain. Pt plans for DC today.  Follow Up Recommendations        Does the patient have the potential to tolerate intense rehabilitation     Barriers to Discharge        Equipment Recommendations       Recommendations for Other Services    Frequency     Progress towards PT Goals    Plan      Precautions / Restrictions Precautions Precautions: Knee;Fall Precaution Comments: AFO for L foot Required Braces or Orthoses: Knee Immobilizer - Left Restrictions Other Position/Activity Restrictions: WBAT   Pertinent Vitals/Pain pretibail l leg and heel.    Mobility  Bed Mobility Supine to Sit: HOB elevated;4: Min assist Details for Bed Mobility Assistance: min A for LLE Transfers Sit to Stand: 4: Min assist;From chair/3-in-1;From bed;With armrests;From elevated surface;With upper extremity assist Stand to Sit: With upper extremity assist;4: Min assist Details for Transfer Assistance: pt much improved for standing and sitting. L shoe does stick to floor prior to sitting. Assist to step out L foot. Ambulation/Gait Ambulation/Gait Assistance: 4: Min assist Ambulation Distance (Feet): 50 Feet Assistive device: Rolling walker Ambulation/Gait Assistance Details: Pt did very well with ambulation today. Pt had shoes and AFO Gait Pattern: Step-through pattern;Narrow base of support    Exercises Total Joint Exercises Ankle Circles/Pumps: PROM;Left;10 reps Quad Sets: AROM;Left;10 reps Heel Slides:  AAROM;Left;10 reps;Supine Hip ABduction/ADduction: AAROM;Left;10 reps;Supine Straight Leg Raises: AAROM;Left;10 reps;Supine   PT Diagnosis:    PT Problem List:   PT Treatment Interventions:     PT Goals (current goals can now be found in the care plan section)    Visit Information  Last PT Received On: 04/25/13 Assistance Needed: +1 History of Present Illness: POD #1 for L TKA, postop L foot drop    Subjective Data      Cognition  Cognition Arousal/Alertness: Awake/alert    Balance     End of Session     GP     Anjenette, Gerbino 04/25/2013, 1:42 PM

## 2013-04-25 NOTE — Discharge Summary (Signed)
Physician Discharge Summary   Patient ID: Stephanie Fletcher MRN: [id number] DOB/AGE: 1929-08-20 79 y.o.  Admit date: 04/22/2013 Discharge date: 04/25/2013  Primary Diagnosis:  Osteoarthritis Left knee  Admission Diagnoses:  Past Medical History  Diagnosis Date  . Measles   . Mumps   . Diabetes mellitus without complication     type 2  . Hypercholesterolemia   . Hypertension   . Diverticulitis   . Gastric ulcer   . Arthritis   . Anemia   . Back injury 1998    T-12 fracture (brace)  . Nodule of left lung   . GERD (gastroesophageal reflux disease)   . History of radiation therapy 12/31/2012-01/10/2013    60 gray to left lower lung  . Peripheral vascular disease 1957/1990    DVT bilateral following fall  . Cancer     adenocarcinoma/  lung   Discharge Diagnoses:   Principal Problem:   OA (osteoarthritis) of knee Active Problems:   Postoperative anemia due to acute blood loss   Hyponatremia   Postop Transfusion   Left foot drop  Estimated body mass index is 23.41 kg/(m^2) as calculated from the following:   Height as of this encounter: 5\' 6"  (1.676 m).   Weight as of this encounter: 65.772 kg (145 lb).  Procedure:  Procedure(s) (LRB): LEFT TOTAL KNEE ARTHROPLASTY (Left) - Lateral Approach  Consults: None  HPI: Stephanie Fletcher is a 79 y.o. year old female with end stage OA of her left knee with progressively worsening pain and dysfunction. She has constant pain, with activity and at rest and significant functional deficits with difficulties even with ADLs. She has had extensive non-op management including analgesics, injections of cortisone and viscosupplements, and home exercise program, but remains in significant pain with significant dysfunction. Radiographs show bone on bone arthritis lateral and patellofemoral with large valgus deformity greater than 20 degrees. She presents now for left Total Knee Arthroplasty.   Laboratory Data: Admission on 04/22/2013    Component Date Value Range Status  . Glucose-Capillary 04/22/2013 108* 70 - 99 mg/dL Final  . Glucose-Capillary 04/22/2013 61* 70 - 99 mg/dL Final  . Glucose-Capillary 04/22/2013 103* 70 - 99 mg/dL Final  . WBC 40/98/1191 4.6  4.0 - 10.5 K/uL Final  . RBC 04/23/2013 2.72* 3.87 - 5.11 MIL/uL Final  . Hemoglobin 04/23/2013 7.6* 12.0 - 15.0 g/dL Final  . HCT 47/82/9562 23.8* 36.0 - 46.0 % Final  . MCV 04/23/2013 87.5  78.0 - 100.0 fL Final  . MCH 04/23/2013 27.9  26.0 - 34.0 pg Final  . MCHC 04/23/2013 31.9  30.0 - 36.0 g/dL Final  . RDW 13/04/6577 13.2  11.5 - 15.5 % Final  . Platelets 04/23/2013 211  150 - 400 K/uL Final  . Sodium 04/23/2013 133* 135 - 145 mEq/L Final  . Potassium 04/23/2013 4.3  3.5 - 5.1 mEq/L Final  . Chloride 04/23/2013 102  96 - 112 mEq/L Final  . CO2 04/23/2013 26  19 - 32 mEq/L Final  . Glucose, Bld 04/23/2013 282* 70 - 99 mg/dL Final  . BUN 46/96/2952 20  6 - 23 mg/dL Final  . Creatinine, Ser 04/23/2013 0.83  0.50 - 1.10 mg/dL Final  . Calcium 84/13/2440 8.3* 8.4 - 10.5 mg/dL Final  . GFR calc non Af Amer 04/23/2013 64* >90 mL/min Final  . GFR calc Af Amer 04/23/2013 74* >90 mL/min Final   Comment:  The eGFR has been calculated                          using the CKD EPI equation.                          This calculation has not been                          validated in all clinical                          situations.                          eGFR's persistently                          <90 mL/min signify                          possible Chronic Kidney Disease.  . Glucose-Capillary 04/22/2013 307* 70 - 99 mg/dL Final  . Comment 1 14/78/2956 Documented in Chart   Final  . Comment 2 04/22/2013 Notify RN   Final  . Glucose-Capillary 04/23/2013 191* 70 - 99 mg/dL Final  . Comment 1 21/30/8657 Notify RN   Final  . Order Confirmation 04/23/2013 ORDER PROCESSED BY BLOOD BANK   Final  . Glucose-Capillary 04/23/2013 172* 70 - 99  mg/dL Final  . WBC 84/69/6295 5.4  4.0 - 10.5 K/uL Final  . RBC 04/24/2013 3.49* 3.87 - 5.11 MIL/uL Final  . Hemoglobin 04/24/2013 9.7* 12.0 - 15.0 g/dL Final   Comment: DELTA CHECK NOTED                          POST TRANSFUSION SPECIMEN  . HCT 04/24/2013 30.2* 36.0 - 46.0 % Final  . MCV 04/24/2013 86.5  78.0 - 100.0 fL Final  . MCH 04/24/2013 27.8  26.0 - 34.0 pg Final  . MCHC 04/24/2013 32.1  30.0 - 36.0 g/dL Final  . RDW 28/41/3244 13.6  11.5 - 15.5 % Final  . Platelets 04/24/2013 198  150 - 400 K/uL Final  . Sodium 04/24/2013 135  135 - 145 mEq/L Final  . Potassium 04/24/2013 4.1  3.5 - 5.1 mEq/L Final  . Chloride 04/24/2013 102  96 - 112 mEq/L Final  . CO2 04/24/2013 27  19 - 32 mEq/L Final  . Glucose, Bld 04/24/2013 176* 70 - 99 mg/dL Final  . BUN 09/14/7251 16  6 - 23 mg/dL Final  . Creatinine, Ser 04/24/2013 0.79  0.50 - 1.10 mg/dL Final  . Calcium 66/44/0347 8.5  8.4 - 10.5 mg/dL Final  . GFR calc non Af Amer 04/24/2013 75* >90 mL/min Final  . GFR calc Af Amer 04/24/2013 87* >90 mL/min Final   Comment:                                 The eGFR has been calculated                          using the CKD EPI equation.  This calculation has not been                          validated in all clinical                          situations.                          eGFR's persistently                          <90 mL/min signify                          possible Chronic Kidney Disease.  . Glucose-Capillary 04/23/2013 237* 70 - 99 mg/dL Final  . Glucose-Capillary 04/23/2013 176* 70 - 99 mg/dL Final  . Glucose-Capillary 04/24/2013 122* 70 - 99 mg/dL Final  . Glucose-Capillary 04/24/2013 114* 70 - 99 mg/dL Final  . WBC 16/06/9603 5.6  4.0 - 10.5 K/uL Final  . RBC 04/25/2013 3.24* 3.87 - 5.11 MIL/uL Final  . Hemoglobin 04/25/2013 9.1* 12.0 - 15.0 g/dL Final  . HCT 54/05/8118 28.2* 36.0 - 46.0 % Final  . MCV 04/25/2013 87.0  78.0 - 100.0 fL Final  . MCH  04/25/2013 28.1  26.0 - 34.0 pg Final  . MCHC 04/25/2013 32.3  30.0 - 36.0 g/dL Final  . RDW 14/78/2956 13.9  11.5 - 15.5 % Final  . Platelets 04/25/2013 189  150 - 400 K/uL Final  . Glucose-Capillary 04/24/2013 121* 70 - 99 mg/dL Final  . Glucose-Capillary 04/24/2013 173* 70 - 99 mg/dL Final  Hospital Outpatient Visit on 04/18/2013  Component Date Value Range Status  . MRSA, PCR 04/18/2013 NEGATIVE  NEGATIVE Final  . Staphylococcus aureus 04/18/2013 POSITIVE* NEGATIVE Final   Comment:                                 The Xpert SA Assay (FDA                          approved for NASAL specimens                          in patients over 86 years of age),                          is one component of                          a comprehensive surveillance                          program.  Test performance has                          been validated by [employer] for patients greater  than or equal to 89 year old.                          It is not intended                          to diagnose infection nor to                          guide or monitor treatment.  . WBC 04/18/2013 5.6  4.0 - 10.5 K/uL Final  . RBC 04/18/2013 3.93  3.87 - 5.11 MIL/uL Final  . Hemoglobin 04/18/2013 10.9* 12.0 - 15.0 g/dL Final  . HCT 16/06/9603 35.0* 36.0 - 46.0 % Final  . MCV 04/18/2013 89.1  78.0 - 100.0 fL Final  . MCH 04/18/2013 27.7  26.0 - 34.0 pg Final  . MCHC 04/18/2013 31.1  30.0 - 36.0 g/dL Final  . RDW 54/05/8118 13.2  11.5 - 15.5 % Final  . Platelets 04/18/2013 312  150 - 400 K/uL Final  . Sodium 04/18/2013 137  135 - 145 mEq/L Final  . Potassium 04/18/2013 4.4  3.5 - 5.1 mEq/L Final  . Chloride 04/18/2013 101  96 - 112 mEq/L Final  . CO2 04/18/2013 22  19 - 32 mEq/L Final  . Glucose, Bld 04/18/2013 109* 70 - 99 mg/dL Final  . BUN 14/78/2956 28* 6 - 23 mg/dL Final  . Creatinine, Ser 04/18/2013 0.99  0.50 - 1.10 mg/dL Final  . Calcium  21/30/8657 9.3  8.4 - 10.5 mg/dL Final  . Total Protein 04/18/2013 6.9  6.0 - 8.3 g/dL Final  . Albumin 84/69/6295 3.4* 3.5 - 5.2 g/dL Final  . AST 28/41/3244 14  0 - 37 U/L Final  . ALT 04/18/2013 12  0 - 35 U/L Final  . Alkaline Phosphatase 04/18/2013 59  39 - 117 U/L Final  . Total Bilirubin 04/18/2013 0.1* 0.3 - 1.2 mg/dL Final  . GFR calc non Af Amer 04/18/2013 52* >90 mL/min Final  . GFR calc Af Amer 04/18/2013 60* >90 mL/min Final   Comment:                                 The eGFR has been calculated                          using the CKD EPI equation.                          This calculation has not been                          validated in all clinical                          situations.                          eGFR's persistently                          <90 mL/min signify  possible Chronic Kidney Disease.  [name] Prothrombin Time 04/18/2013 12.5  11.6 - 15.2 seconds Final  . INR 04/18/2013 0.95  0.00 - 1.49 Final  . aPTT 04/18/2013 32  24 - 37 seconds Final  . Color, Urine 04/18/2013 YELLOW  YELLOW Final  . APPearance 04/18/2013 CLEAR  CLEAR Final  . Specific Gravity, Urine 04/18/2013 1.031* 1.005 - 1.030 Final  . pH 04/18/2013 5.0  5.0 - 8.0 Final  . Glucose, UA 04/18/2013 NEGATIVE  NEGATIVE mg/dL Final  . Hgb urine dipstick 04/18/2013 NEGATIVE  NEGATIVE Final  . Bilirubin Urine 04/18/2013 NEGATIVE  NEGATIVE Final  . Ketones, ur 04/18/2013 NEGATIVE  NEGATIVE mg/dL Final  . Protein, ur 16/06/9603 NEGATIVE  NEGATIVE mg/dL Final  . Urobilinogen, UA 04/18/2013 1.0  0.0 - 1.0 mg/dL Final  . Nitrite 54/05/8118 NEGATIVE  NEGATIVE Final  . Leukocytes, UA 04/18/2013 NEGATIVE  NEGATIVE Final   MICROSCOPIC NOT DONE ON URINES WITH NEGATIVE PROTEIN, BLOOD, LEUKOCYTES, NITRITE, OR GLUCOSE <1000 mg/dL.  . ABO/RH(D) 04/18/2013 O POS   Final  . Antibody Screen 04/18/2013 NEG   Final  . Sample Expiration 04/18/2013 04/25/2013   Final  . Unit Number 04/18/2013  [id number]   Final  . Blood Component Type 04/18/2013 RED CELLS,LR   Final  . Unit division 04/18/2013 00   Final  . Status of Unit 04/18/2013 ISSUED,FINAL   Final  . Transfusion Status 04/18/2013 OK TO TRANSFUSE   Final  . Crossmatch Result 04/18/2013 Compatible   Final  . Unit Number 04/18/2013 [id number]   Final  . Blood Component Type 04/18/2013 RED CELLS,LR   Final  . Unit division 04/18/2013 00   Final  . Status of Unit 04/18/2013 ISSUED,FINAL   Final  . Transfusion Status 04/18/2013 OK TO TRANSFUSE   Final  . Crossmatch Result 04/18/2013 Compatible   Final     X-Rays:No results found.  EKG: Orders placed during the hospital encounter of 04/18/13  . EKG 12-LEAD  . EKG 12-LEAD     Hospital Course: Stephanie Fletcher is a 6 y.o. who was admitted to [facility]. They were brought to the operating room on 04/22/2013 and underwent Procedure(s): LEFT TOTAL KNEE ARTHROPLASTY.  Patient tolerated the procedure well and was later transferred to the recovery room and then to the orthopaedic floor for postoperative care.  They were given PO and IV analgesics for pain control following their surgery.  They were given 24 hours of postoperative antibiotics of  Anti-infectives   Start     Dose/Rate Route Frequency Ordered Stop   04/22/13 1800  ceFAZolin (ANCEF) IVPB 1 g/50 mL premix     1 g 100 mL/hr over 30 Minutes Intravenous Every 6 hours 04/22/13 1532 04/23/13 0050   04/22/13 0845  ceFAZolin (ANCEF) IVPB 2 g/50 mL premix     2 g 100 mL/hr over 30 Minutes Intravenous On call to O.R. 04/22/13 4132 04/22/13 1156     and started on DVT prophylaxis in the form of Xarelto.   PT and OT were ordered for total joint protocol.  Discharge planning consulted to help with postop disposition and equipment needs.  Patient had a decent night on the evening of surgery.  They started to get up OOB with therapy on day one and got up about 3  feet. Hemovac drain was pulled without difficulty.   She was noticed to have a left foot drop on the morning during rounds. An AFO splint was ordered to wear when the patient was  weight bearing and ambulating.  The dressing was loosened. Noted that she was a large valgus deformity greater than 20 degrees preoperatively.   Continued to work with therapy into day two walking a little further of about 20 feet.  Dressing was changed on day two and the incision was healing well.  By day three, the patient had progressed with therapy and meeting their goals. She was wearing the AFO when up.  She had a slight twitch noted in her EHL on the morning noted by Dr. Lequita Halt which was a good sign for return of her motor function.  Incision was healing well.  Patient was seen in rounds and was ready to go to [facility] in [place].   Discharge Medications: Prior to Admission medications   Medication Sig Start Date End Date Taking? Authorizing Provider  dicyclomine (BENTYL) 20 MG tablet Take 20 mg by mouth every 6 (six) hours as needed (for stomach).   Yes Historical Provider, MD  eszopiclone (LUNESTA) 1 MG TABS tablet Take 1 mg by mouth at bedtime. Take immediately before bedtime   Yes Historical Provider, MD  metFORMIN (GLUCOPHAGE) 500 MG tablet Take 500-1,000 mg by mouth 3 (three) times daily. Take 2 tablets (1000 mg) every morning, 1 tablet (500 mg) at noon, 1 tablet (500 mg) every evening   Yes Historical Provider, MD  Multiple Vitamins-Minerals (PRESERVISION AREDS 2 PO) Take 2 mg by mouth 2 (two) times daily.   Yes Historical Provider, MD  omeprazole (PRILOSEC) 20 MG capsule Take 20 mg by mouth 2 (two) times daily as needed (acid reflux).    Yes Historical Provider, MD  pioglitazone (ACTOS) 15 MG tablet Take 15 mg by mouth daily with breakfast.    Yes Historical Provider, MD  polyethylene glycol (MIRALAX / GLYCOLAX) packet Take 17 g by mouth daily as needed (constipation).   Yes Historical Provider, MD  pravastatin (PRAVACHOL) 20 MG tablet Take 20 mg by mouth at  bedtime.   Yes Historical Provider, MD  sitaGLIPtin (JANUVIA) 100 MG tablet Take 100 mg by mouth daily with breakfast.    Yes Historical Provider, MD  valsartan-hydrochlorothiazide (DIOVAN HCT) 160-25 MG per tablet Take 1 tablet by mouth every morning.   Yes Historical Provider, MD  zolpidem (AMBIEN) 5 MG tablet Take 5 mg by mouth at bedtime as needed for sleep.   Yes Historical Provider, MD  bisacodyl (DULCOLAX) 10 MG suppository Place 1 suppository (10 mg total) rectally daily as needed. 04/25/13   Alexzandrew Julien Girt, PA-C  diphenhydrAMINE (BENADRYL) 12.5 MG/5ML elixir Take 5-10 mL (12.5-25 mg total) by mouth every 4 (four) hours as needed for itching. 04/25/13   Alexzandrew Perkins, PA-C  docusate sodium 100 MG CAPS Take 100 mg by mouth 2 (two) times daily. 04/25/13   Alexzandrew Julien Girt, PA-C  methocarbamol (ROBAXIN) 500 MG tablet Take 1 tablet (500 mg total) by mouth every 6 (six) hours as needed. 04/25/13   Alexzandrew Julien Girt, PA-C  metoCLOPramide (REGLAN) 5 MG tablet Take 1 tablet (5 mg total) by mouth every 8 (eight) hours as needed (if ondansetron (ZOFRAN) ineffective.). 04/25/13   Alexzandrew Perkins, PA-C  ondansetron (ZOFRAN) 4 MG tablet Take 1 tablet (4 mg total) by mouth every 6 (six) hours as needed for nausea. 04/25/13   Alexzandrew Perkins, PA-C  oxyCODONE (OXY IR/ROXICODONE) 5 MG immediate release tablet Take 1-2 tablets (5-10 mg total) by mouth every 3 (three) hours as needed. 04/25/13   Alexzandrew Julien Girt, PA-C  rivaroxaban (XARELTO) 10 MG TABS tablet Take 1  tablet (10 mg total) by mouth daily with breakfast. Take Xarelto for two and a half more weeks, then discontinue Xarelto. Once the patient has completed the Xarelto, they may resume the 81 mg Aspirin. 04/25/13   Alexzandrew Perkins, PA-C  traMADol (ULTRAM) 50 MG tablet Take 1-2 tablets (50-100 mg total) by mouth every 6 (six) hours as needed (mild pain). 04/25/13   Alexzandrew Julien Girt, PA-C    Diet: Cardiac diet and Diabetic  diet Activity:WBAT Follow-up:in 2 weeks Disposition - Skilled nursing facility - [facility] in [place] Discharged Condition: stable   Discharge Orders   Future Appointments Provider Department Dept Phone   05/23/2013 10:00 AM Jonna Coup, MD [facility] [phone]   Future Orders Complete By Expires   Call MD / Call 911  As directed    Comments:     If you experience chest pain or shortness of breath, CALL 911 and be transported to the hospital emergency room.  If you develope a fever above 101 F, pus (white drainage) or increased drainage or redness at the wound, or calf pain, call your surgeon's office.   Change dressing  As directed    Comments:     Change dressing daily with sterile 4 x 4 inch gauze dressing and apply TED hose. Do not submerge the incision under water.   Constipation Prevention  As directed    Comments:     Drink plenty of fluids.  Prune juice may be helpful.  You may use a stool softener, such as Colace (over the counter) 100 mg twice a day.  Use MiraLax (over the counter) for constipation as needed.   Diet - low sodium heart healthy  As directed    Diet Carb Modified  As directed    Discharge instructions  As directed    Comments:     Pick up stool softner and laxative for home. Do not submerge incision under water. May shower. Continue to use ice for pain and swelling from surgery. AFO to left foot when weight bearing and ambulating  Take Xarelto for two and a half more weeks, then discontinue Xarelto. Once the patient has completed the Xarelto, they may resume the 81 mg Aspirin.  When discharged from the skilled rehab facility, please have the facility set up the patient's Home Health Physical Therapy prior to being released.  Also provide the patient with their medications at time of release from the facility to include their pain medication, the muscle relaxants, and their blood thinner medication.  If the patient  is still at the rehab facility at time of follow up appointment, please also assist the patient in arranging follow up appointment in our office and any transportation needs.   Do not sit on low chairs, stoools or toilet seats, as it may be difficult to get up from low surfaces  As directed    Driving restrictions  As directed    Comments:     No driving until released by the physician.   Increase activity slowly as tolerated  As directed    Lifting restrictions  As directed    Comments:     No lifting until released by the physician.   Patient may shower  As directed    Comments:     You may shower without a dressing once there is no drainage.  Do not wash over the wound.  If drainage remains, do not shower until drainage stops.   TED hose  As directed    Comments:     Use stockings (TED hose) for 3 weeks on both leg(s).  You may remove them at night for sleeping.   Weight bearing as tolerated  As directed        Medication List    STOP taking these medications       aspirin EC 81 MG tablet     cyanocobalamin 1000 MCG/ML injection  Commonly known as:  (VITAMIN B-12)      TAKE these medications       bisacodyl 10 MG suppository  Commonly known as:  DULCOLAX  Place 1 suppository (10 mg total) rectally daily as needed.     dicyclomine 20 MG tablet  Commonly known as:  BENTYL  Take 20 mg by mouth every 6 (six) hours as needed (for stomach).     DIOVAN HCT 160-25 MG per tablet  Generic drug:  valsartan-hydrochlorothiazide  Take 1 tablet by mouth every morning.     diphenhydrAMINE 12.5 MG/5ML elixir  Commonly known as:  BENADRYL  Take 5-10 mL (12.5-25 mg total) by mouth every 4 (four) hours as needed for itching.     DSS 100 MG Caps  Take 100 mg by mouth 2 (two) times daily.     eszopiclone 1 MG Tabs tablet  Commonly known as:  LUNESTA  Take 1 mg by mouth at bedtime. Take immediately before bedtime     metFORMIN 500 MG tablet  Commonly known as:  GLUCOPHAGE  Take  500-1,000 mg by mouth 3 (three) times daily. Take 2 tablets (1000 mg) every morning, 1 tablet (500 mg) at noon, 1 tablet (500 mg) every evening     methocarbamol 500 MG tablet  Commonly known as:  ROBAXIN  Take 1 tablet (500 mg total) by mouth every 6 (six) hours as needed.     metoCLOPramide 5 MG tablet  Commonly known as:  REGLAN  Take 1 tablet (5 mg total) by mouth every 8 (eight) hours as needed (if ondansetron (ZOFRAN) ineffective.).     omeprazole 20 MG capsule  Commonly known as:  PRILOSEC  Take 20 mg by mouth 2 (two) times daily as needed (acid reflux).     ondansetron 4 MG tablet  Commonly known as:  ZOFRAN  Take 1 tablet (4 mg total) by mouth every 6 (six) hours as needed for nausea.     oxyCODONE 5 MG immediate release tablet  Commonly known as:  Oxy IR/ROXICODONE  Take 1-2 tablets (5-10 mg total) by mouth every 3 (three) hours as needed.     pioglitazone 15 MG tablet  Commonly known as:  ACTOS  Take 15 mg by mouth daily with breakfast.     polyethylene glycol packet  Commonly known as:  MIRALAX / GLYCOLAX  Take 17 g by mouth daily as needed (constipation).     pravastatin 20 MG tablet  Commonly known as:  PRAVACHOL  Take 20 mg by mouth at bedtime.     PRESERVISION AREDS 2 PO  Take 2 mg by mouth 2 (two) times daily.     rivaroxaban 10 MG Tabs tablet  Commonly known as:  XARELTO  - Take 1 tablet (10 mg total) by mouth daily with breakfast. Take Xarelto for two and a half more weeks, then discontinue Xarelto.  - Once the patient has completed the Xarelto, they may resume the 81 mg Aspirin.     sitaGLIPtin 100 MG tablet  Commonly known as:  JANUVIA  Take 100  mg by mouth daily with breakfast.     traMADol 50 MG tablet  Commonly known as:  ULTRAM  Take 1-2 tablets (50-100 mg total) by mouth every 6 (six) hours as needed (mild pain).     zolpidem 5 MG tablet  Commonly known as:  AMBIEN  Take 5 mg by mouth at bedtime as needed for sleep.             Follow-up Information   Follow up with Loanne Drilling, MD. Schedule an appointment as soon as possible for a visit on 05/07/2013. (Call [phone] tomorrow to make the appointment)    Specialty:  Orthopedic Surgery   Contact information:   [address] [place] [address] [phone]       Signed: Patrica Duel 04/25/2013, 7:40 AM

## 2013-04-25 NOTE — Anesthesia Postprocedure Evaluation (Signed)
  Anesthesia Post-op Note  Patient: Stephanie Fletcher  Procedure(s) Performed: Procedure(s) (LRB): LEFT TOTAL KNEE ARTHROPLASTY (Left)  Patient Location: PACU  Anesthesia Type: Spinal  Level of Consciousness: awake and alert   Airway and Oxygen Therapy: Patient Spontanous Breathing  Post-op Pain: mild  Post-op Assessment: Post-op Vital signs reviewed, Patient's Cardiovascular Status Stable, Respiratory Function Stable, Patent Airway and No signs of Nausea or vomiting  Last Vitals:  Filed Vitals:   04/25/13 0650  BP: 120/64  Pulse: 102  Temp: 37.5 C  Resp: 16    Post-op Vital Signs: stable   Complications: No apparent anesthesia complications

## 2013-04-25 NOTE — Progress Notes (Signed)
   Subjective: 3 Days Post-Op Procedure(s) (LRB): LEFT TOTAL KNEE ARTHROPLASTY (Left) Patient reports pain as mild.   Patient seen in rounds with Dr. Lequita Halt. Patient is well, but has had some minor complaints of pain in the knee, requiring pain medications Patient is ready to go the SNF - [facility] in [place].  Objective: Vital signs in last 24 hours: Temp:  [98.3 F (36.8 C)-99.7 F (37.6 C)] 99.5 F (37.5 C) (08/14 0650) Pulse Rate:  [87-102] 102 (08/14 0650) Resp:  [14-16] 16 (08/14 0650) BP: (111-138)/(64-75) 120/64 mmHg (08/14 0650) SpO2:  [93 %-96 %] 93 % (08/14 0650)  Intake/Output from previous day:  Intake/Output Summary (Last 24 hours) at 04/25/13 0731 Last data filed at 04/25/13 0650  Gross per 24 hour  Intake  646.5 ml  Output    351 ml  Net  295.5 ml    Intake/Output this shift:    Labs:  Recent Labs  04/23/13 0423 04/24/13 0358 04/25/13 0415  HGB 7.6* 9.7* 9.1*    Recent Labs  04/24/13 0358 04/25/13 0415  WBC 5.4 5.6  RBC 3.49* 3.24*  HCT 30.2* 28.2*  PLT 198 189    Recent Labs  04/23/13 0423 04/24/13 0358  NA 133* 135  K 4.3 4.1  CL 102 102  CO2 26 27  BUN 20 16  CREATININE 0.83 0.79  GLUCOSE 282* 176*  CALCIUM 8.3* 8.5   No results found for this basename: LABPT, INR,  in the last 72 hours  EXAM: General - Patient is Alert, Appropriate and Oriented Extremity - Neurovascular intact  Sensation intact distally  She has plantarflexion but no active dorsiflexion or but has slight twitch to EHL today Incision - clean, dry, no drainage, healing  Assessment/Plan: 3 Days Post-Op Procedure(s) (LRB): LEFT TOTAL KNEE ARTHROPLASTY (Left) Procedure(s) (LRB): LEFT TOTAL KNEE ARTHROPLASTY (Left) Past Medical History  Diagnosis Date  . Measles   . Mumps   . Diabetes mellitus without complication     type 2  . Hypercholesterolemia   . Hypertension   . Diverticulitis   . Gastric ulcer   . Arthritis   . Anemia   . Back injury  1998    T-12 fracture (brace)  . Nodule of left lung   . GERD (gastroesophageal reflux disease)   . History of radiation therapy 12/31/2012-01/10/2013    60 gray to left lower lung  . Peripheral vascular disease 1957/1990    DVT bilateral following fall  . Cancer     adenocarcinoma/  lung   Principal Problem:   OA (osteoarthritis) of knee Active Problems:   Postoperative anemia due to acute blood loss   Hyponatremia   Postop Transfusion   Left foot drop  Estimated body mass index is 23.41 kg/(m^2) as calculated from the following:   Height as of this encounter: 5\' 6"  (1.676 m).   Weight as of this encounter: 65.772 kg (145 lb). Discharge to SNF Diet - Cardiac diet and Diabetic diet Follow up - in 2 weeks Activity - WBAT Disposition - Skilled nursing facility Condition Upon Discharge - Good D/C Meds - See DC Summary DVT Prophylaxis - Xarelto  Aleathia Purdy 04/25/2013, 7:31 AM

## 2013-04-26 ENCOUNTER — Encounter: Payer: Self-pay | Admitting: Family Medicine

## 2013-04-26 DIAGNOSIS — I10 Essential (primary) hypertension: Secondary | ICD-10-CM

## 2013-04-26 DIAGNOSIS — C3430 Malignant neoplasm of lower lobe, unspecified bronchus or lung: Secondary | ICD-10-CM

## 2013-04-26 DIAGNOSIS — M1710 Unilateral primary osteoarthritis, unspecified knee: Secondary | ICD-10-CM

## 2013-04-26 DIAGNOSIS — D62 Acute posthemorrhagic anemia: Secondary | ICD-10-CM

## 2013-04-26 DIAGNOSIS — E119 Type 2 diabetes mellitus without complications: Secondary | ICD-10-CM

## 2013-04-26 DIAGNOSIS — G4700 Insomnia, unspecified: Secondary | ICD-10-CM | POA: Insufficient documentation

## 2013-04-26 DIAGNOSIS — E785 Hyperlipidemia, unspecified: Secondary | ICD-10-CM

## 2013-04-26 DIAGNOSIS — M216X9 Other acquired deformities of unspecified foot: Secondary | ICD-10-CM

## 2013-05-15 DIAGNOSIS — E119 Type 2 diabetes mellitus without complications: Secondary | ICD-10-CM

## 2013-05-16 ENCOUNTER — Encounter (HOSPITAL_COMMUNITY): Payer: Self-pay

## 2013-05-16 ENCOUNTER — Ambulatory Visit (HOSPITAL_COMMUNITY)
Admission: RE | Admit: 2013-05-16 | Discharge: 2013-05-16 | Disposition: A | Discharge status: Home / Self Care | Payer: Medicare Other | Source: Ambulatory Visit | Attending: Radiation Oncology | Admitting: Radiation Oncology

## 2013-05-16 DIAGNOSIS — I2510 Atherosclerotic heart disease of native coronary artery without angina pectoris: Secondary | ICD-10-CM | POA: Insufficient documentation

## 2013-05-16 DIAGNOSIS — I700 Atherosclerosis of aorta: Secondary | ICD-10-CM | POA: Insufficient documentation

## 2013-05-16 DIAGNOSIS — N281 Cyst of kidney, acquired: Secondary | ICD-10-CM | POA: Insufficient documentation

## 2013-05-16 DIAGNOSIS — Z923 Personal history of irradiation: Secondary | ICD-10-CM | POA: Insufficient documentation

## 2013-05-16 DIAGNOSIS — K449 Diaphragmatic hernia without obstruction or gangrene: Secondary | ICD-10-CM | POA: Insufficient documentation

## 2013-05-16 DIAGNOSIS — C3490 Malignant neoplasm of unspecified part of unspecified bronchus or lung: Secondary | ICD-10-CM | POA: Insufficient documentation

## 2013-05-16 DIAGNOSIS — I517 Cardiomegaly: Secondary | ICD-10-CM | POA: Insufficient documentation

## 2013-05-16 MED ORDER — IOHEXOL 300 MG/ML  SOLN
80.0000 mL | Freq: Once | INTRAMUSCULAR | Status: AC | PRN
  Administered 2013-05-16: 80 mL via INTRAVENOUS

## 2013-05-23 ENCOUNTER — Ambulatory Visit
Admission: RE | Admit: 2013-05-23 | Discharge: 2013-05-23 | Disposition: A | Discharge status: Home / Self Care | Payer: Medicare Other | Source: Ambulatory Visit | Attending: Radiation Oncology | Admitting: Radiation Oncology

## 2013-05-23 ENCOUNTER — Encounter: Payer: Self-pay | Admitting: Radiation Oncology

## 2013-05-23 DIAGNOSIS — I10 Essential (primary) hypertension: Secondary | ICD-10-CM

## 2013-05-23 DIAGNOSIS — E119 Type 2 diabetes mellitus without complications: Secondary | ICD-10-CM

## 2013-05-23 DIAGNOSIS — K219 Gastro-esophageal reflux disease without esophagitis: Secondary | ICD-10-CM

## 2013-05-23 DIAGNOSIS — M1710 Unilateral primary osteoarthritis, unspecified knee: Secondary | ICD-10-CM

## 2013-05-23 NOTE — Progress Notes (Signed)
Follow up rad tx left lung  12/31/12-01/10/13, CT ches resuklts 05/16/13, patient denys coughing, nausea, sob, 96% room air, 4 weeks ago had left total knee  Replacement, Dr. Cleotis Nipper, has nerve damage , foot drop, appetite good, declined weight 10:59 AM

## 2013-05-24 ENCOUNTER — Encounter: Payer: Self-pay | Admitting: Radiation Oncology

## 2013-05-24 NOTE — Progress Notes (Signed)
Attempted to contact patient to let her know about CT and follow up scheduled for 11/2013, but received no answer.  Mailed letter to patient with this information.

## 2013-05-24 NOTE — Progress Notes (Signed)
Radiation Oncology         [phone] ________________________________  Name: Stephanie Fletcher MRN: [id number]  Date: 05/23/2013  DOB: 1929/02/27  Follow-Up Visit Note  CC: Stephanie Flock, MD  Stephanie Fletcher, *  Diagnosis:   Non-small cell lung cancer status post SBRT  Interval Since Last Radiation:  4 months   Narrative:  The patient returns today for routine follow-up.  The patient states that she is doing reasonably well with respect to for breathing. No shortness of breath or cough. The patient did have knee surgery recently and is recovering from this. She had a recent CT scan of the chest and presents today for review of this.                           ALLERGIES:  is allergic to actos and fosamax.  Meds: Current Outpatient Prescriptions  Medication Sig Dispense Refill  . bisacodyl (DULCOLAX) 10 MG suppository Place 1 suppository (10 mg total) rectally daily as needed.  12 suppository  0  . dicyclomine (BENTYL) 20 MG tablet Take 20 mg by mouth every 6 (six) hours as needed (for stomach).      . diphenhydrAMINE (BENADRYL) 12.5 MG/5ML elixir Take 5-10 mL (12.5-25 mg total) by mouth every 4 (four) hours as needed for itching.  120 mL  0  . docusate sodium 100 MG CAPS Take 100 mg by mouth 2 (two) times daily.  10 capsule  0  . eszopiclone (LUNESTA) 1 MG TABS tablet Take 1 mg by mouth at bedtime. Take immediately before bedtime      . metFORMIN (GLUCOPHAGE) 500 MG tablet Take 500-1,000 mg by mouth 3 (three) times daily. Take 2 tablets (1000 mg) every morning, 1 tablet (500 mg) at noon, 1 tablet (500 mg) every evening      . methocarbamol (ROBAXIN) 500 MG tablet Take 1 tablet (500 mg total) by mouth every 6 (six) hours as needed.  80 tablet  0  . metoCLOPramide (REGLAN) 5 MG tablet Take 1 tablet (5 mg total) by mouth every 8 (eight) hours as needed (if ondansetron (ZOFRAN) ineffective.).  40 tablet    . Multiple Vitamins-Minerals (PRESERVISION AREDS 2 PO) Take 2 mg by mouth 2  (two) times daily.      [name] omeprazole (PRILOSEC) 20 MG capsule Take 20 mg by mouth 2 (two) times daily as needed (acid reflux).       . ondansetron (ZOFRAN) 4 MG tablet Take 1 tablet (4 mg total) by mouth every 6 (six) hours as needed for nausea.  20 tablet  0  . oxyCODONE (OXY IR/ROXICODONE) 5 MG immediate release tablet Take 1-2 tablets (5-10 mg total) by mouth every 3 (three) hours as needed.  80 tablet  0  . pioglitazone (ACTOS) 15 MG tablet Take 15 mg by mouth daily with breakfast.       . polyethylene glycol (MIRALAX / GLYCOLAX) packet Take 17 g by mouth daily as needed (constipation).      . pravastatin (PRAVACHOL) 20 MG tablet Take 20 mg by mouth at bedtime.      . rivaroxaban (XARELTO) 10 MG TABS tablet Take 1 tablet (10 mg total) by mouth daily with breakfast. Take Xarelto for two and a half more weeks, then discontinue Xarelto. Once the patient has completed the Xarelto, they may resume the 81 mg Aspirin.  19 tablet  0  . sitaGLIPtin (JANUVIA) 100 MG tablet Take 100 mg by mouth  daily with breakfast.       . traMADol (ULTRAM) 50 MG tablet Take 1-2 tablets (50-100 mg total) by mouth every 6 (six) hours as needed (mild pain).  60 tablet  0  . valsartan-hydrochlorothiazide (DIOVAN HCT) 160-25 MG per tablet Take 1 tablet by mouth every morning.      . zolpidem (AMBIEN) 5 MG tablet Take 5 mg by mouth at bedtime as needed for sleep.       No current facility-administered medications for this encounter.    Physical Findings: The patient is in no acute distress. Patient is alert and oriented.  oral temperature is 97.8 F (36.6 C). Her blood pressure is 138/62 and her pulse is 66. Her respiration is 20 and oxygen saturation is 96%. .   General: Well-developed, in no acute distress HEENT: Normocephalic, atraumatic Cardiovascular: Regular rate and rhythm Respiratory: Clear to auscultation bilaterally GI: Soft, nontender, normal bowel sounds Extremities: No edema present   Lab  Findings: Lab Results  Component Value Date   WBC 5.6 04/25/2013   HGB 9.1* 04/25/2013   HCT 28.2* 04/25/2013   MCV 87.0 04/25/2013   PLT 189 04/25/2013     Radiographic Findings: Ct Chest W Contrast  05/16/2013   *RADIOLOGY REPORT*  Clinical Data: Non-small cell lung cancer status post XRT  CT CHEST WITH CONTRAST  Technique:  Multidetector CT imaging of the chest was performed following the standard protocol during bolus administration of intravenous contrast.  Contrast: 80mL OMNIPAQUE IOHEXOL 300 MG/ML  SOLN  Comparison: PET CT dated 11/13/2012.  CT chest dated 11/01/2012.  Findings: 3.6 x 2.3 cm left lower lobe mass abutting the major fissure (series 5/image 35), corresponding to known primary lung cancer, previously 5.2 x 2.9 cm.  Additional focal/patchy opacities in the left lower lobe and lingula, favored to reflect radiation changes.  No new/suspicious pulmonary nodules.  Visualized thyroid is unremarkable.  Heart is mildly enlarged.  No pericardial effusion.  Coronary atherosclerosis.  Atherosclerotic calcifications of the aortic arch.  No suspicious mediastinal, hilar, or axillary lymphadenopathy.  Moderate hiatal hernia.  Visualized upper abdomen is notable for mildly heterogeneous perfusion of the liver (series 2/image 51), bilateral adrenal adenomas, left renal cysts, and vascular calcifications.  Degenerative changes of the visualized thoracolumbar spine. Moderate superior compression deformity at L1 (sagittal image 56), unchanged.  IMPRESSION: 3.6 cm left lower lobe mass, corresponding to known primary lung cancer, decreased.  Associated radiation changes in the left lower lobe and lingula.  No evidence of metastatic disease in the chest.   Original Report Authenticated By: Charline Bills, M.D.    Impression:    The patient's CT scan looked okay and we reviewed this today. We will continue ongoing followup.  Plan:   CT scan of the chest in 6 months, then followup appointment.  I spent  15 minutes with the patient today, the majority of which was spent counseling the patient on the diagnosis of cancer and coordinating care.   Radene Gunning, M.D., Ph.D.

## 2013-07-04 NOTE — Progress Notes (Signed)
Need orders when able please - pt coming for preop WED 07/15/13 - thank you

## 2013-07-07 ENCOUNTER — Other Ambulatory Visit: Payer: Self-pay | Admitting: Orthopedic Surgery

## 2013-07-10 ENCOUNTER — Encounter (HOSPITAL_COMMUNITY): Payer: Self-pay | Admitting: Pharmacy Technician

## 2013-07-11 ENCOUNTER — Other Ambulatory Visit (HOSPITAL_COMMUNITY): Payer: Self-pay | Admitting: Orthopedic Surgery

## 2013-07-11 NOTE — Patient Instructions (Signed)
20 Stephanie Fletcher  07/11/2013   Your procedure is scheduled on:  07/19/13  FRIDAY  Report to [facility] at   0615    AM.  Call this number if you have problems the morning of surgery: [phone]       Remember: DO NOT TAKE ANY DIABETIC MEDICINE MORNING OF SURGERY  Do not eat food  Or drink :After Midnight. Thursday NIGHT   Take these medicines the morning of surgery with A SIP OF WATER: OMEPRAZOLE                              MAY TAKE TRAMADOL IF NEEDED   .  Contacts, dentures or partial plates can not be worn to surgery  Leave suitcase in the car. After surgery it may be brought to your room.  For patients admitted to the hospital, checkout time is 11:00 AM day of  discharge.             SPECIAL INSTRUCTIONS- SEE [place] PREPARING FOR SURGERY INSTRUCTION SHEET-     DO NOT WEAR JEWELRY, LOTIONS, POWDERS, OR PERFUMES.  WOMEN-- DO NOT SHAVE LEGS OR UNDERARMS FOR 12 HOURS BEFORE SHOWERS. MEN MAY SHAVE FACE.  Patients discharged the day of surgery will not be allowed to drive home. IF going home the day of surgery, you must have a driver and someone to stay with you for the first 24 hours  Name and phone number of your driver:      [facility] [employer]                                                                  Please read over the following fact sheets that you were given:Incentive Spirometry Sheet                                                                                 Kalani Sthilaire  PST [phone]  [phone]                 FAILURE TO FOLLOW THESE INSTRUCTIONS MAY RESULT IN  CANCELLATION   OF YOUR SURGERY                                                  Patient Signature _____________________________

## 2013-07-11 NOTE — Progress Notes (Signed)
Chest CT 9/14 [facility]

## 2013-07-15 ENCOUNTER — Encounter (HOSPITAL_COMMUNITY)
Admission: RE | Admit: 2013-07-15 | Discharge: 2013-07-15 | Disposition: A | Discharge status: Home / Self Care | Payer: Medicare Other | Source: Ambulatory Visit | Attending: Orthopedic Surgery | Admitting: Orthopedic Surgery

## 2013-07-15 ENCOUNTER — Encounter (HOSPITAL_COMMUNITY): Payer: Self-pay

## 2013-07-15 ENCOUNTER — Encounter (INDEPENDENT_AMBULATORY_CARE_PROVIDER_SITE_OTHER): Payer: Self-pay

## 2013-07-15 DIAGNOSIS — Z01812 Encounter for preprocedural laboratory examination: Secondary | ICD-10-CM | POA: Insufficient documentation

## 2013-07-15 DIAGNOSIS — Z01818 Encounter for other preprocedural examination: Secondary | ICD-10-CM | POA: Insufficient documentation

## 2013-07-15 DIAGNOSIS — Z01810 Encounter for preprocedural cardiovascular examination: Secondary | ICD-10-CM | POA: Insufficient documentation

## 2013-07-15 LAB — CBC
MCH: 28.8 pg (ref 26.0–34.0)
MCHC: 32 g/dL (ref 30.0–36.0)
Platelets: 265 10*3/uL (ref 150–400)
RBC: 4 MIL/uL (ref 3.87–5.11)

## 2013-07-15 LAB — BASIC METABOLIC PANEL
BUN: 34 mg/dL — ABNORMAL HIGH (ref 6–23)
Calcium: 9.8 mg/dL (ref 8.4–10.5)
Chloride: 103 mEq/L (ref 96–112)
GFR calc Af Amer: 45 mL/min — ABNORMAL LOW (ref >90)
GFR calc non Af Amer: 39 mL/min — ABNORMAL LOW (ref >90)
Glucose, Bld: 174 mg/dL — ABNORMAL HIGH (ref 70–99)
Sodium: 136 mEq/L (ref 135–145)

## 2013-07-16 NOTE — Progress Notes (Signed)
Faxed BMET to Dr Aluisio through [facility] 

## 2013-07-17 NOTE — Progress Notes (Signed)
Received previous EKG from Dr Hyacinth Meeker at [facility] 12/13, 4/14- placed on chart for reference

## 2013-07-18 NOTE — H&P (Signed)
Stephanie Fletcher is an 79 y.o. female.   Chief Complaint: Left foot weakness HPI: Stephanie Fletcher is a 79 yo female s/p left total knee arthroplasty 04/22/13 with correction of a large valgus deformity. Post-operatively she was noted to have a peroneal nerve palsy. She has not had any considerable improvement despite adequate physical therapy. EMG/NCV showed compression of the nerve at the fibular head. She presents now for peroneal nerve decompression.  Past Medical History  Diagnosis Date  . Measles   . Mumps   . Diabetes mellitus without complication     type 2  . Hypercholesterolemia   . Hypertension   . Diverticulitis   . Gastric ulcer   . Arthritis   . Anemia   . Back injury 1998    T-12 fracture (brace)  . Nodule of left lung   . GERD (gastroesophageal reflux disease)   . History of radiation therapy 12/31/2012-01/10/2013    60 gray to left lower lung  . Cancer     adenocarcinoma/  lung  . Peripheral vascular disease 1957/1997    DVT bilateral following fall    Past Surgical History  Procedure Laterality Date  . Abdominal hysterectomy  1979  . Hernia repair Right 1972  . Eye surgery Bilateral 2007, 2008    cataracts  . Appendectomy  1949  . Breast biopsy Bilateral [id number]  . Carpal tunnel release Bilateral 1997  . Skin graft  1955    right groin  . Cholecystectomy    . Video bronchoscopy with endobronchial navigation N/A 11/22/2012    Procedure: VIDEO BRONCHOSCOPY WITH ENDOBRONCHIAL NAVIGATION;  Surgeon: Loreli Slot, MD;  Location: [facility] OR;  Service: Thoracic;  Laterality: N/A;  . Total knee arthroplasty Left 04/22/2013    Procedure: LEFT TOTAL KNEE ARTHROPLASTY;  Surgeon: Loanne Drilling, MD;  Location: [facility] ORS;  Service: Orthopedics;  Laterality: Left;    Family History  Problem Relation Age of Onset  . Hypertension Mother   . Stroke Mother   . Hypertension Father   . Heart disease Father   . Hypertension Brother    Social History:  reports that she has  never smoked. She has never used smokeless tobacco. She reports that she does not drink alcohol or use illicit drugs.  Allergies:  Allergies  Allergen Reactions  . Actos [Pioglitazone] Other (See Comments)    Rash with 30 mg tablet- tolerating 15mg   . Fosamax [Alendronate] Rash    No prescriptions prior to admission    No results found for this or any previous visit (from the past 48 hour(s)). No results found.  ROS  There were no vitals taken for this visit. Physical Exam  Physical Examination: General appearance - alert, well appearing, and in no distress Mental status - alert, oriented to person, place, and time Chest - clear to auscultation, no wheezes, rales or rhonchi, symmetric air entry Heart - normal rate, regular rhythm, normal S1, S2, no murmurs, rubs, clicks or gallops Abdomen - soft, nontender, nondistended, no masses or organomegaly Neurological - alert, oriented, normal speech, no focal findings or movement disorder noted Left foot- diminished sensation dorsum of foot. No EHL or EDL function. Plantar sensation and plantar flexion intact   Assessment/Plan Left peroneal nerve palsy- Plan left peroneal nerve decompression. Procedure, risks potential complications and rehab course are discussed with pt and family and they elect to proceed  Saliha Salts V 07/18/2013, 11:23 PM

## 2013-07-19 ENCOUNTER — Encounter (HOSPITAL_COMMUNITY): Payer: Medicare Other | Admitting: Anesthesiology

## 2013-07-19 ENCOUNTER — Ambulatory Visit (HOSPITAL_COMMUNITY): Payer: Medicare Other | Admitting: Anesthesiology

## 2013-07-19 ENCOUNTER — Encounter (HOSPITAL_COMMUNITY)
Admission: RE | Disposition: A | Discharge status: Home / Self Care | Payer: Self-pay | Source: Ambulatory Visit | Attending: Orthopedic Surgery

## 2013-07-19 ENCOUNTER — Encounter (HOSPITAL_COMMUNITY): Payer: Self-pay | Admitting: *Deleted

## 2013-07-19 ENCOUNTER — Observation Stay (HOSPITAL_COMMUNITY)
Admission: RE | Admit: 2013-07-19 | Discharge: 2013-07-20 | Disposition: A | Discharge status: Home / Self Care | Payer: Medicare Other | Source: Ambulatory Visit | Attending: Orthopedic Surgery | Admitting: Orthopedic Surgery

## 2013-07-19 DIAGNOSIS — Z923 Personal history of irradiation: Secondary | ICD-10-CM | POA: Insufficient documentation

## 2013-07-19 DIAGNOSIS — E119 Type 2 diabetes mellitus without complications: Secondary | ICD-10-CM | POA: Insufficient documentation

## 2013-07-19 DIAGNOSIS — S8410XA Injury of peroneal nerve at lower leg level, unspecified leg, initial encounter: Secondary | ICD-10-CM

## 2013-07-19 DIAGNOSIS — K219 Gastro-esophageal reflux disease without esophagitis: Secondary | ICD-10-CM | POA: Insufficient documentation

## 2013-07-19 DIAGNOSIS — I739 Peripheral vascular disease, unspecified: Secondary | ICD-10-CM | POA: Insufficient documentation

## 2013-07-19 DIAGNOSIS — Z85118 Personal history of other malignant neoplasm of bronchus and lung: Secondary | ICD-10-CM | POA: Insufficient documentation

## 2013-07-19 DIAGNOSIS — I10 Essential (primary) hypertension: Secondary | ICD-10-CM | POA: Insufficient documentation

## 2013-07-19 DIAGNOSIS — E7800 Pure hypercholesterolemia, unspecified: Secondary | ICD-10-CM | POA: Insufficient documentation

## 2013-07-19 DIAGNOSIS — Z96659 Presence of unspecified artificial knee joint: Secondary | ICD-10-CM | POA: Insufficient documentation

## 2013-07-19 DIAGNOSIS — G5730 Lesion of lateral popliteal nerve, unspecified lower limb: Principal | ICD-10-CM | POA: Insufficient documentation

## 2013-07-19 DIAGNOSIS — Z86718 Personal history of other venous thrombosis and embolism: Secondary | ICD-10-CM | POA: Insufficient documentation

## 2013-07-19 HISTORY — PX: SUPERFICIAL PERONEAL NERVE RELEASE: SHX6200

## 2013-07-19 LAB — GLUCOSE, CAPILLARY
Glucose-Capillary: 133 mg/dL — ABNORMAL HIGH (ref 70–99)
Glucose-Capillary: 274 mg/dL — ABNORMAL HIGH (ref 70–99)

## 2013-07-19 SURGERY — DECOMPRESSION, NERVE, SUPERFICIAL PERONEAL
Anesthesia: General | Site: Knee | Laterality: Left | Wound class: Clean

## 2013-07-19 MED ORDER — CEFAZOLIN SODIUM-DEXTROSE 2-3 GM-% IV SOLR
2.0000 g | INTRAVENOUS | Status: AC
  Administered 2013-07-19: 2 g via INTRAVENOUS

## 2013-07-19 MED ORDER — METOCLOPRAMIDE HCL 5 MG PO TABS
5.0000 mg | ORAL_TABLET | Freq: Three times a day (TID) | ORAL | Status: DC | PRN
  Filled 2013-07-19: qty 2

## 2013-07-19 MED ORDER — FENTANYL CITRATE 0.05 MG/ML IJ SOLN
INTRAMUSCULAR | Status: DC | PRN
  Administered 2013-07-19 (×2): 50 ug via INTRAVENOUS
  Administered 2013-07-19: 100 ug via INTRAVENOUS
  Administered 2013-07-19: 50 ug via INTRAVENOUS

## 2013-07-19 MED ORDER — PROMETHAZINE HCL 25 MG/ML IJ SOLN: 6.2500 mg | INTRAMUSCULAR | Status: DC | PRN

## 2013-07-19 MED ORDER — POLYETHYLENE GLYCOL 3350 17 G PO PACK: 17.0000 g | PACK | Freq: Every day | ORAL | Status: DC | PRN

## 2013-07-19 MED ORDER — TRAMADOL HCL 50 MG PO TABS: 50.0000 mg | ORAL_TABLET | Freq: Four times a day (QID) | ORAL | Status: DC | PRN

## 2013-07-19 MED ORDER — ONDANSETRON HCL 4 MG PO TABS: 4.0000 mg | ORAL_TABLET | Freq: Four times a day (QID) | ORAL | Status: DC | PRN

## 2013-07-19 MED ORDER — BUPIVACAINE-EPINEPHRINE 0.25% -1:200000 IJ SOLN
INTRAMUSCULAR | Status: DC | PRN
  Administered 2013-07-19: 20 mL

## 2013-07-19 MED ORDER — CEFAZOLIN SODIUM 1-5 GM-% IV SOLN
1.0000 g | Freq: Four times a day (QID) | INTRAVENOUS | Status: AC
  Administered 2013-07-19 – 2013-07-20 (×3): 1 g via INTRAVENOUS
  Filled 2013-07-19 (×3): qty 50

## 2013-07-19 MED ORDER — ENOXAPARIN SODIUM 40 MG/0.4ML ~~LOC~~ SOLN
40.0000 mg | SUBCUTANEOUS | Status: DC
  Administered 2013-07-20: 40 mg via SUBCUTANEOUS
  Filled 2013-07-19 (×2): qty 0.4

## 2013-07-19 MED ORDER — VALSARTAN-HYDROCHLOROTHIAZIDE 160-25 MG PO TABS: 1.0000 | ORAL_TABLET | Freq: Every morning | ORAL | Status: DC

## 2013-07-19 MED ORDER — HYDROCHLOROTHIAZIDE 25 MG PO TABS
25.0000 mg | ORAL_TABLET | Freq: Every day | ORAL | Status: DC
  Administered 2013-07-19: 17:00:00 25 mg via ORAL
  Filled 2013-07-19 (×2): qty 1

## 2013-07-19 MED ORDER — BUPIVACAINE-EPINEPHRINE PF 0.25-1:200000 % IJ SOLN
INTRAMUSCULAR | Status: AC
  Filled 2013-07-19: qty 30

## 2013-07-19 MED ORDER — METOCLOPRAMIDE HCL 5 MG/ML IJ SOLN: 5.0000 mg | Freq: Three times a day (TID) | INTRAMUSCULAR | Status: DC | PRN

## 2013-07-19 MED ORDER — METFORMIN HCL 500 MG PO TABS
1000.0000 mg | ORAL_TABLET | Freq: Every day | ORAL | Status: DC
  Administered 2013-07-20: 08:00:00 1000 mg via ORAL
  Filled 2013-07-19 (×2): qty 2

## 2013-07-19 MED ORDER — METHOCARBAMOL 500 MG PO TABS: 500.0000 mg | ORAL_TABLET | Freq: Four times a day (QID) | ORAL | Status: DC | PRN

## 2013-07-19 MED ORDER — MORPHINE SULFATE 10 MG/ML IJ SOLN: 1.0000 mg | INTRAMUSCULAR | Status: DC | PRN

## 2013-07-19 MED ORDER — DEXAMETHASONE SODIUM PHOSPHATE 10 MG/ML IJ SOLN
10.0000 mg | Freq: Once | INTRAMUSCULAR | Status: AC
  Administered 2013-07-19: 10 mg via INTRAVENOUS

## 2013-07-19 MED ORDER — PIOGLITAZONE HCL 15 MG PO TABS
15.0000 mg | ORAL_TABLET | Freq: Every day | ORAL | Status: DC
  Administered 2013-07-20: 15 mg via ORAL
  Filled 2013-07-19 (×2): qty 1

## 2013-07-19 MED ORDER — LINAGLIPTIN 5 MG PO TABS
5.0000 mg | ORAL_TABLET | Freq: Every day | ORAL | Status: DC
  Administered 2013-07-19 – 2013-07-20 (×2): 5 mg via ORAL
  Filled 2013-07-19 (×2): qty 1

## 2013-07-19 MED ORDER — FENTANYL CITRATE 0.05 MG/ML IJ SOLN: 25.0000 ug | INTRAMUSCULAR | Status: DC | PRN

## 2013-07-19 MED ORDER — PANTOPRAZOLE SODIUM 40 MG PO TBEC
40.0000 mg | DELAYED_RELEASE_TABLET | Freq: Every day | ORAL | Status: DC
  Administered 2013-07-20: 40 mg via ORAL
  Filled 2013-07-19: qty 1

## 2013-07-19 MED ORDER — DICYCLOMINE HCL 20 MG PO TABS
20.0000 mg | ORAL_TABLET | Freq: Four times a day (QID) | ORAL | Status: DC | PRN
  Filled 2013-07-19: qty 1

## 2013-07-19 MED ORDER — PHENYLEPHRINE HCL 10 MG/ML IJ SOLN
INTRAMUSCULAR | Status: DC | PRN
  Administered 2013-07-19: 80 ug via INTRAVENOUS

## 2013-07-19 MED ORDER — PROPOFOL 10 MG/ML IV BOLUS
INTRAVENOUS | Status: DC | PRN
  Administered 2013-07-19: 50 mg via INTRAVENOUS
  Administered 2013-07-19: 150 mg via INTRAVENOUS

## 2013-07-19 MED ORDER — HYDROCODONE-ACETAMINOPHEN 5-325 MG PO TABS: 1.0000 | ORAL_TABLET | ORAL | Status: DC | PRN

## 2013-07-19 MED ORDER — ONDANSETRON HCL 4 MG/2ML IJ SOLN
INTRAMUSCULAR | Status: DC | PRN
  Administered 2013-07-19: 4 mg via INTRAVENOUS

## 2013-07-19 MED ORDER — ONDANSETRON HCL 4 MG/2ML IJ SOLN: 4.0000 mg | Freq: Four times a day (QID) | INTRAMUSCULAR | Status: DC | PRN

## 2013-07-19 MED ORDER — ACETAMINOPHEN 10 MG/ML IV SOLN
1000.0000 mg | Freq: Once | INTRAVENOUS | Status: DC
  Filled 2013-07-19: qty 100

## 2013-07-19 MED ORDER — LIDOCAINE HCL (CARDIAC) 10 MG/ML IV SOLN
INTRAVENOUS | Status: DC | PRN
  Administered 2013-07-19: 50 mg via INTRAVENOUS

## 2013-07-19 MED ORDER — INSULIN ASPART 100 UNIT/ML ~~LOC~~ SOLN
0.0000 [IU] | Freq: Three times a day (TID) | SUBCUTANEOUS | Status: DC
  Administered 2013-07-19: 17:00:00 5 [IU] via SUBCUTANEOUS

## 2013-07-19 MED ORDER — SODIUM CHLORIDE 0.9 % IV SOLN: INTRAVENOUS | Status: DC

## 2013-07-19 MED ORDER — ZOLPIDEM TARTRATE 5 MG PO TABS: 5.0000 mg | ORAL_TABLET | Freq: Every evening | ORAL | Status: DC | PRN

## 2013-07-19 MED ORDER — METHOCARBAMOL 100 MG/ML IJ SOLN
500.0000 mg | Freq: Four times a day (QID) | INTRAVENOUS | Status: DC | PRN
  Filled 2013-07-19: qty 5

## 2013-07-19 MED ORDER — CEFAZOLIN SODIUM-DEXTROSE 2-3 GM-% IV SOLR
INTRAVENOUS | Status: AC
  Filled 2013-07-19: qty 50

## 2013-07-19 MED ORDER — ACETAMINOPHEN 10 MG/ML IV SOLN
INTRAVENOUS | Status: DC | PRN
  Administered 2013-07-19: 1000 mg via INTRAVENOUS

## 2013-07-19 MED ORDER — LACTATED RINGERS IV SOLN
INTRAVENOUS | Status: DC
  Administered 2013-07-19: 1000 mL via INTRAVENOUS
  Administered 2013-07-19: 10:00:00 via INTRAVENOUS

## 2013-07-19 MED ORDER — SODIUM CHLORIDE 0.9 % IV SOLN
INTRAVENOUS | Status: DC
  Administered 2013-07-19: 12:00:00 75 mL/h via INTRAVENOUS

## 2013-07-19 MED ORDER — IRBESARTAN 150 MG PO TABS
150.0000 mg | ORAL_TABLET | Freq: Every day | ORAL | Status: DC
  Administered 2013-07-19 – 2013-07-20 (×2): 150 mg via ORAL
  Filled 2013-07-19 (×2): qty 1

## 2013-07-19 SURGICAL SUPPLY — 33 items
BAG ZIPLOCK 12X15 (MISCELLANEOUS) ×2 IMPLANT
BANDAGE ELASTIC 6 VELCRO ST LF (GAUZE/BANDAGES/DRESSINGS) ×2 IMPLANT
CLOSURE STERI-STRIP 1/4X4 (GAUZE/BANDAGES/DRESSINGS) ×2 IMPLANT
CLOTH BEACON ORANGE TIMEOUT ST (SAFETY) IMPLANT
DECANTER SPIKE VIAL GLASS SM (MISCELLANEOUS) IMPLANT
DRAPE EXTREMITY T 121X128X90 (DRAPE) ×2 IMPLANT
DRSG EMULSION OIL 3X3 NADH (GAUZE/BANDAGES/DRESSINGS) ×2 IMPLANT
DURAPREP 26ML APPLICATOR (WOUND CARE) ×2 IMPLANT
ELECT REM PT RETURN 9FT ADLT (ELECTROSURGICAL) ×2
ELECTRODE REM PT RTRN 9FT ADLT (ELECTROSURGICAL) ×1 IMPLANT
GLOVE BIO SURGEON STRL SZ7.5 (GLOVE) ×2 IMPLANT
GLOVE BIO SURGEON STRL SZ8 (GLOVE) ×4 IMPLANT
GLOVE BIOGEL PI IND STRL 8 (GLOVE) ×1 IMPLANT
GLOVE BIOGEL PI INDICATOR 8 (GLOVE) ×1
GOWN PREVENTION PLUS LG XLONG (DISPOSABLE) ×4 IMPLANT
GOWN STRL REIN XL XLG (GOWN DISPOSABLE) ×2 IMPLANT
KIT BASIN OR (CUSTOM PROCEDURE TRAY) ×2 IMPLANT
MANIFOLD NEPTUNE II (INSTRUMENTS) ×2 IMPLANT
NEEDLE HYPO 25X1 1.5 SAFETY (NEEDLE) ×2 IMPLANT
NS IRRIG 1000ML POUR BTL (IV SOLUTION) ×2 IMPLANT
PACK TOTAL JOINT (CUSTOM PROCEDURE TRAY) ×2 IMPLANT
PADDING CAST COTTON 6X4 STRL (CAST SUPPLIES) ×2 IMPLANT
POSITIONER SURGICAL ARM (MISCELLANEOUS) ×2 IMPLANT
SPONGE GAUZE 4X4 12PLY (GAUZE/BANDAGES/DRESSINGS) ×2 IMPLANT
STRIP CLOSURE SKIN 1/4X4 (GAUZE/BANDAGES/DRESSINGS) ×2 IMPLANT
SUT MNCRL AB 4-0 PS2 18 (SUTURE) ×2 IMPLANT
SUT VIC AB 1 CT1 27 (SUTURE) ×1
SUT VIC AB 1 CT1 27XBRD ANTBC (SUTURE) ×1 IMPLANT
SUT VIC AB 2-0 CT1 27 (SUTURE) ×3
SUT VIC AB 2-0 CT1 TAPERPNT 27 (SUTURE) ×3 IMPLANT
SYR 20CC LL (SYRINGE) ×2 IMPLANT
TOWEL OR 17X26 10 PK STRL BLUE (TOWEL DISPOSABLE) ×4 IMPLANT
WATER STERILE IRR 1500ML POUR (IV SOLUTION) IMPLANT

## 2013-07-19 NOTE — Care Management Note (Signed)
  Page 1 of 1   07/19/2013     5:28:17 PM   CARE MANAGEMENT NOTE 07/19/2013  Patient:  Stephanie Fletcher, Stephanie Fletcher   Account Number:  [id number]  Date Initiated:  07/19/2013  Documentation initiated by:  Colleen Can  Subjective/Objective Assessment:   dx peroneal l nerve palsy; decompression peroneal nerve     Action/Plan:   CM spoke with patient. Plans are for patient to return to her home at [place]. States she palns to has a personal caregiver that will care for her when she  gets home. If HH therapy is needed she statesvshe will use [facility] agency.   Anticipated DC Date:  07/20/2013   Anticipated DC Plan:  HOME/SELF CARE      DC Planning Services  CM consult      Choice offered to / List presented to:             Status of service:  In process, will continue to follow Medicare Important Message given?   (If response is "NO", the following Medicare IM given date fields will be blank) Date Medicare IM given:   Date Additional Medicare IM given:    Discharge Disposition:    Per UR Regulation:    If discussed at Long Length of Stay Meetings, dates discussed:    Comments:  07/19/2013 Colleen Can BSN RN CCM [phone] Pt states she already has RW, cane and commode seat. There are currently  no HH orders.

## 2013-07-19 NOTE — Anesthesia Postprocedure Evaluation (Signed)
  Anesthesia Post-op Note  Patient: Stephanie Fletcher  Procedure(s) Performed: Procedure(s) (LRB): LEFT PERONEAL NERVE DECOMPRESSION  (Left)  Patient Location: PACU  Anesthesia Type: General  Level of Consciousness: awake and alert   Airway and Oxygen Therapy: Patient Spontanous Breathing  Post-op Pain: mild  Post-op Assessment: Post-op Vital signs reviewed, Patient's Cardiovascular Status Stable, Respiratory Function Stable, Patent Airway and No signs of Nausea or vomiting  Last Vitals:  Filed Vitals:   07/19/13 1100  BP: 120/38  Pulse: 67  Temp: 36.4 C  Resp: 14    Post-op Vital Signs: stable   Complications: No apparent anesthesia complications

## 2013-07-19 NOTE — Progress Notes (Signed)
07/19/13 2130 Nursing Patient's cbg tonight is 274. Patient refusing insulin coverage at this time.

## 2013-07-19 NOTE — Anesthesia Preprocedure Evaluation (Signed)
Anesthesia Evaluation  Patient identified by MRN, date of birth, ID band Patient awake    Reviewed: Allergy & Precautions, H&P , NPO status , Patient's Chart, lab work & pertinent test results  Airway Mallampati: II TM Distance: >3 FB Neck ROM: Full    Dental no notable dental hx.    Pulmonary  Cancer   adenocarcinoma/  lung  breath sounds clear to auscultation  Pulmonary exam normal       Cardiovascular hypertension, Pt. on medications Rhythm:Regular Rate:Normal     Neuro/Psych negative neurological ROS  negative psych ROS   GI/Hepatic negative GI ROS, Neg liver ROS,   Endo/Other  diabetes, Type 2  Renal/GU negative Renal ROS  negative genitourinary   Musculoskeletal negative musculoskeletal ROS (+)   Abdominal   Peds negative pediatric ROS (+)  Hematology negative hematology ROS (+)   Anesthesia Other Findings   Reproductive/Obstetrics negative OB ROS                           Anesthesia Physical Anesthesia Plan  ASA: III  Anesthesia Plan: General   Post-op Pain Management:    Induction: Intravenous  Airway Management Planned: LMA  Additional Equipment:   Intra-op Plan:   Post-operative Plan:   Informed Consent: I have reviewed the patients History and Physical, chart, labs and discussed the procedure including the risks, benefits and alternatives for the proposed anesthesia with the patient or authorized representative who has indicated his/her understanding and acceptance.   Dental advisory given  Plan Discussed with: CRNA and Surgeon  Anesthesia Plan Comments:         Anesthesia Quick Evaluation

## 2013-07-19 NOTE — Interval H&P Note (Signed)
History and Physical Interval Note:  07/19/2013 8:17 AM  Stephanie Fletcher  has presented today for surgery, with the diagnosis of LEFT PERONEAL NERVE PALSY   The various methods of treatment have been discussed with the patient and family. After consideration of risks, benefits and other options for treatment, the patient has consented to  Procedure(s): LEFT PERONEAL NERVE DECOMPRESSION  (Left) as a surgical intervention .  The patient's history has been reviewed, patient examined, no change in status, stable for surgery.  I have reviewed the patient's chart and labs.  Questions were answered to the patient's satisfaction.     Loanne Drilling

## 2013-07-19 NOTE — Transfer of Care (Signed)
Immediate Anesthesia Transfer of Care Note  Patient: Stephanie Fletcher  Procedure(s) Performed: Procedure(s): LEFT PERONEAL NERVE DECOMPRESSION  (Left)  Patient Location: PACU  Anesthesia Type:General  Level of Consciousness: awake, alert , oriented and patient cooperative  Airway & Oxygen Therapy: Patient Spontanous Breathing and Patient connected to face mask oxygen  Post-op Assessment: Report given to PACU RN, Post -op Vital signs reviewed and stable and Patient moving all extremities X 4  Post vital signs: stable  Complications: No apparent anesthesia complications

## 2013-07-19 NOTE — Brief Op Note (Signed)
07/19/2013  10:12 AM  PATIENT:  Stephanie Fletcher  79 y.o. female  PRE-OPERATIVE DIAGNOSIS:  LEFT PERONEAL NERVE PALSY  POST-OPERATIVE DIAGNOSIS:  LEFT PERONEAL NERVE PALSY  PROCEDURE:  Procedure(s): LEFT PERONEAL NERVE DECOMPRESSION  (Left)  SURGEON:  Surgeon(s) and Role:    * Loanne Drilling, MD - Primary  PHYSICIAN ASSISTANT:   ASSISTANTS: Avel Peace, PA-C   ANESTHESIA:   general  EBL:     BLOOD ADMINISTERED:none  DRAINS: none   LOCAL MEDICATIONS USED:  MARCAINE     COUNTS:  YES  TOURNIQUET:   Total Tourniquet Time Documented: Thigh (Left) - 13 minutes Total: Thigh (Left) - 13 minutes   DICTATION: .Other Dictation: Dictation Number [id number]  PLAN OF CARE: Admit for overnight observation  PATIENT DISPOSITION:  PACU - hemodynamically stable.

## 2013-07-20 LAB — GLUCOSE, CAPILLARY: Glucose-Capillary: 171 mg/dL — ABNORMAL HIGH (ref 70–99)

## 2013-07-20 NOTE — Op Note (Signed)
Stephanie Fletcher, Stephanie Fletcher            ACCOUNT NO.:  [id number]  MEDICAL RECORD NO.:  [id number]  LOCATION:  1607                         FACILITY:  [facility]  PHYSICIAN:  Ollen Gross, M.D.    DATE OF BIRTH:  [birth year]  DATE OF PROCEDURE:  07/19/2013 DATE OF DISCHARGE:                              OPERATIVE REPORT   PREOPERATIVE DIAGNOSIS:  Left peroneal nerve palsy.  POSTOPERATIVE DIAGNOSIS:  Left peroneal nerve palsy.  PROCEDURE:  Left peroneal nerve decompression.  SURGEON:  Ollen Gross, M.D.  ASSISTANT:  Alexzandrew L. Julien Girt, PA-C  ANESTHESIA:  General.  ESTIMATED BLOOD LOSS:  Minimal.  DRAINS:  None.  COMPLICATIONS:  None.  CONDITION:  Stable to recovery.  BRIEF CLINICAL NOTE:  [name] is an 79 year old female, left total knee arthroplasty done early in August 2014.  She had a large valgus deformity to her knee.  After the procedure, it was noted that she had lost dorsiflexion of her foot.  She had a slight return of sensation. EMG and CT showed peroneal nerve compression at the fibular head.  She presents now for peroneal nerve decompression as she did not respond to observation.  PROCEDURE IN DETAIL:  After successful administration of general anesthetic, the patient was placed in the right lateral decubitus position with the left side up and held with a hip positioner.  A tourniquet was placed high on her left thigh, and her left lower extremity was prepped and draped in the usual sterile fashion.  The fibula head was marked and incision line placed to look proximal from the fibular head along the posterior margin of the hamstring tendon just above the knee.  The extremity was wrapped in Esmarch, and tourniquet inflated to 300 mmHg.  Incision was made with a 10 blade through subcutaneous tissue.  The superficial fascia was identified and sized. Fascia overlying the hamstring area was also incised.  Nerve was identified and carefully dissected proximally.   The isolated nerve carefully dissected down distally up to the level of the fibular head. It was a tight band compressing the nerve as it goes posterior to the fibular head.  This band was released and the pressure removed from the nerve.  I traced it down into the peroneal musculature.  The tourniquet was then released, total time of 12 minutes.  Minor bleeding was stopped with cautery.  The wound was irrigated with saline solution.  The superficial fascia was loosely closed with interrupted 2-0 Vicryl so as not to decompress the nerve.  Subcu was then closed with interrupted 2-0 Vicryl.  20 mL of 0.25% Marcaine are injected into the subcu tissues superficially so as not to get near the nerve.  Subcu was closed with interrupted 2-0 Vicryl and subcuticular running 4-0 Monocryl.  Incision was cleaned and dried and Steri-Strips and a bulky sterile dressing applied.  She was then awakened and transported to recovery in stable condition.     Ollen Gross, M.D.     FA/MEDQ  D:  07/19/2013  T:  07/20/2013  Job:  [id number]

## 2013-07-20 NOTE — Progress Notes (Signed)
Called in Rx for Ultram to [facility] # [phone]

## 2013-07-20 NOTE — Progress Notes (Signed)
Discharged from floor via w/c, family with pt. No changes in assessment. Stephanie Fletcher  

## 2013-07-20 NOTE — Progress Notes (Signed)
Subjective: 1 Day Post-Op Procedure(s) (LRB): LEFT PERONEAL NERVE DECOMPRESSION  (Left) Patient reports pain as mild.    Objective: Vital signs in last 24 hours: Temp:  [97 F (36.1 C)-98.4 F (36.9 C)] 97.9 F (36.6 C) (11/08 0505) Pulse Rate:  [61-73] 61 (11/08 0505) Resp:  [13-17] 16 (11/08 0505) BP: (100-150)/(38-75) 150/69 mmHg (11/08 0505) SpO2:  [94 %-100 %] 96 % (11/08 0505) Weight:  [143 lb (64.864 kg)] 143 lb (64.864 kg) (11/07 1126)  Intake/Output from previous day: 11/07 0701 - 11/08 0700 In: 2680 [P.O.:480; I.Fletcher.:2150; IV Piggyback:50] Out: 2300 [Urine:2300] Intake/Output this shift: Total I/O In: -  Out: 240 [Urine:240]  No results found for this basename: HGB,  in the last 72 hours No results found for this basename: WBC, RBC, HCT, PLT,  in the last 72 hours No results found for this basename: NA, K, CL, CO2, BUN, CREATININE, GLUCOSE, CALCIUM,  in the last 72 hours No results found for this basename: LABPT, INR,  in the last 72 hours  Neurologically intact Intact pulses distally Incision: dressing C/D/I No cellulitis present Compartment soft has regained some sensation 1st dorsal web space but no motor yet  Assessment/Plan: 1 Day Post-Op Procedure(s) (LRB): LEFT PERONEAL NERVE DECOMPRESSION  (Left) Discharge home today  Stephanie Fletcher 07/20/2013, 8:23 AM

## 2013-07-22 ENCOUNTER — Encounter (HOSPITAL_COMMUNITY): Payer: Self-pay | Admitting: Orthopedic Surgery

## 2013-08-14 ENCOUNTER — Encounter: Payer: Self-pay | Admitting: Radiation Oncology

## 2013-08-14 NOTE — Progress Notes (Signed)
Received call from Lyla Son with [facility] for a request of an itemized bill.  I faxed this information after receiving a release of information.

## 2013-08-15 NOTE — Discharge Summary (Signed)
Physician Discharge Summary   Patient ID: Stephanie Fletcher MRN: [id number] DOB/AGE: [birth year] 79 y.o.  Admit date: 07/19/2013 Discharge date: 07/20/2013  Primary Diagnosis:  LEFT PERONEAL NERVE PALSY  Admission Diagnoses:  Past Medical History  Diagnosis Date  . Measles   . Mumps   . Diabetes mellitus without complication     type 2  . Hypercholesterolemia   . Hypertension   . Diverticulitis   . Gastric ulcer   . Arthritis   . Anemia   . Back injury 1998    T-12 fracture (brace)  . Nodule of left lung   . GERD (gastroesophageal reflux disease)   . History of radiation therapy 12/31/2012-01/10/2013    60 gray to left lower lung  . Cancer     adenocarcinoma/  lung  . Peripheral vascular disease 1957/1997    DVT bilateral following fall   Discharge Diagnoses:   Principal Problem:   Injury to peroneal nerve  Estimated body mass index is 23.09 kg/(m^2) as calculated from the following:   Height as of this encounter: 5\' 6"  (1.676 m).   Weight as of this encounter: 64.864 kg (143 lb).  Procedure:  Procedure(s) (LRB): LEFT PERONEAL NERVE DECOMPRESSION  (Left)   Consults: None  HPI: Stephanie Fletcher is an 79 year old female, left total  knee arthroplasty done early in August 2014. She had a large valgus  deformity to her knee. After the procedure, it was noted that she had  lost dorsiflexion of her foot. She had a slight return of sensation.  EMG and CT showed peroneal nerve compression at the fibular head. She  presents now for peroneal nerve decompression as she did not respond to  observation.  Laboratory Data: Admission on 07/19/2013, Discharged on 07/20/2013  Component Date Value Range Status  . Glucose-Capillary 07/19/2013 133* 70 - 99 mg/dL Final  . Comment 1 78/29/5621 Documented in Chart   Final  . Glucose-Capillary 07/19/2013 99  70 - 99 mg/dL Final  . Comment 1 30/86/5784 Documented in Chart   Final  . Comment 2 07/19/2013 Notify RN   Final  .  Glucose-Capillary 07/19/2013 152* 70 - 99 mg/dL Final  . Glucose-Capillary 07/19/2013 302* 70 - 99 mg/dL Final  . Comment 1 69/62/9528 Notify RN   Final  . Glucose-Capillary 07/19/2013 274* 70 - 99 mg/dL Final  . Glucose-Capillary 07/20/2013 171* 70 - 99 mg/dL Final  Hospital Outpatient Visit on 07/15/2013  Component Date Value Range Status  . Sodium 07/15/2013 136  135 - 145 mEq/L Final  . Potassium 07/15/2013 5.1  3.5 - 5.1 mEq/L Final  . Chloride 07/15/2013 103  96 - 112 mEq/L Final  . CO2 07/15/2013 21  19 - 32 mEq/L Final  . Glucose, Bld 07/15/2013 174* 70 - 99 mg/dL Final  . BUN 41/32/4401 34* 6 - 23 mg/dL Final  . Creatinine, Ser 07/15/2013 1.25* 0.50 - 1.10 mg/dL Final  . Calcium 02/72/5366 9.8  8.4 - 10.5 mg/dL Final  . GFR calc non Af Amer 07/15/2013 39* >90 mL/min Final  . GFR calc Af Amer 07/15/2013 45* >90 mL/min Final   Comment: (NOTE)                          The eGFR has been calculated using the CKD EPI equation.                          This calculation has not  been validated in all clinical situations.                          eGFR's persistently <90 mL/min signify possible Chronic Kidney                          Disease.  . WBC 07/15/2013 5.0  4.0 - 10.5 K/uL Final  . RBC 07/15/2013 4.00  3.87 - 5.11 MIL/uL Final  . Hemoglobin 07/15/2013 11.5* 12.0 - 15.0 g/dL Final  . HCT 40/98/1191 35.9* 36.0 - 46.0 % Final  . MCV 07/15/2013 89.8  78.0 - 100.0 fL Final  . MCH 07/15/2013 28.8  26.0 - 34.0 pg Final  . MCHC 07/15/2013 32.0  30.0 - 36.0 g/dL Final  . RDW 47/82/9562 14.2  11.5 - 15.5 % Final  . Platelets 07/15/2013 265  150 - 400 K/uL Final     X-Rays:No results found.  EKG: Orders placed during the hospital encounter of 07/15/13  . EKG 12-LEAD  . EKG 12-LEAD     Hospital Course: Stephanie Fletcher is a 7 y.o. who was admitted to [facility]. They were brought to the operating room on 07/19/2013 and underwent Procedure(s): LEFT PERONEAL NERVE  DECOMPRESSION .  Patient tolerated the procedure well and was later transferred to the recovery room and then to the orthopaedic floor for postoperative care.  They were given PO and IV analgesics for pain control following their surgery.  They were given 24 hours of postoperative antibiotics of  Anti-infectives   Start     Dose/Rate Route Frequency Ordered Stop   07/19/13 1500  ceFAZolin (ANCEF) IVPB 1 g/50 mL premix     1 g 100 mL/hr over 30 Minutes Intravenous Every 6 hours 07/19/13 1141 07/20/13 0325   07/19/13 0700  ceFAZolin (ANCEF) IVPB 2 g/50 mL premix     2 g 100 mL/hr over 30 Minutes Intravenous On call to O.R. 07/19/13 0645 07/19/13 0920     PT and OT were ordered for total joint protocol.  Discharge planning consulted to help with postop disposition and equipment needs.  Patient had a good night on the evening of surgery.  They started to get up OOB with therapy on day one.  Patient was seen in rounds, doing well, and was ready to go home.   Discharge Medications: Prior to Admission medications   Medication Sig Start Date End Date Taking? Authorizing Provider  aspirin 81 MG tablet Take 81 mg by mouth daily.   Yes Historical Provider, MD  CYANOCOBALAMIN IJ Inject as directed every 30 (thirty) days.   Yes Historical Provider, MD  dicyclomine (BENTYL) 20 MG tablet Take 20 mg by mouth every 6 (six) hours as needed (for stomach).   Yes Historical Provider, MD  metFORMIN (GLUCOPHAGE) 500 MG tablet Take 500-1,000 mg by mouth 3 (three) times daily. Take 2 tablets (1000 mg) every morning, 1 tablet (500 mg) at noon, 1/2 tablet (250 mg) every evening   Yes Historical Provider, MD  pioglitazone (ACTOS) 15 MG tablet Take 15 mg by mouth daily with breakfast.    Yes Historical Provider, MD  polyethylene glycol (MIRALAX / GLYCOLAX) packet Take 17 g by mouth daily as needed (constipation).   Yes Historical Provider, MD  pravastatin (PRAVACHOL) 20 MG tablet Take 20 mg by mouth at bedtime.   Yes  Historical Provider, MD  sitaGLIPtin (JANUVIA) 100 MG tablet Take 100 mg by  mouth daily with breakfast.    Yes Historical Provider, MD  traMADol (ULTRAM) 50 MG tablet Take 1-2 tablets (50-100 mg total) by mouth every 6 (six) hours as needed (mild pain). 04/25/13  Yes Alexzandrew Julien Girt, PA-C  valsartan-hydrochlorothiazide (DIOVAN HCT) 160-25 MG per tablet Take 1 tablet by mouth every morning.   Yes Historical Provider, MD  zolpidem (AMBIEN) 5 MG tablet Take 5 mg by mouth at bedtime as needed for sleep.   Yes Historical Provider, MD  Multiple Vitamins-Minerals (PRESERVISION AREDS 2 PO) Take 2 mg by mouth 2 (two) times daily.    Historical Provider, MD  omeprazole (PRILOSEC) 20 MG capsule Take 20 mg by mouth 2 (two) times daily as needed (acid reflux).     Historical Provider, MD     Activity:WBAT Follow-up:in 2 weeks Disposition - Home Discharged Condition: good    Future Appointments Provider Department Dept Phone   11/25/2013 9:30 AM [facility] [phone]   Patient to arrive 15 minutes prior to appointment time.   11/28/2013 10:00 AM Jonna Coup, MD  [facility] [phone]       Medication List         aspirin 81 MG tablet  Take 81 mg by mouth daily.     CYANOCOBALAMIN IJ  Inject as directed every 30 (thirty) days.     dicyclomine 20 MG tablet  Commonly known as:  BENTYL  Take 20 mg by mouth every 6 (six) hours as needed (for stomach).     DIOVAN HCT 160-25 MG per tablet  Generic drug:  valsartan-hydrochlorothiazide  Take 1 tablet by mouth every morning.     metFORMIN 500 MG tablet  Commonly known as:  GLUCOPHAGE  Take 500-1,000 mg by mouth 3 (three) times daily. Take 2 tablets (1000 mg) every morning, 1 tablet (500 mg) at noon, 1/2 tablet (250 mg) every evening     omeprazole 20 MG capsule  Commonly known as:  PRILOSEC  Take 20 mg by mouth 2 (two) times daily as needed (acid reflux).      pioglitazone 15 MG tablet  Commonly known as:  ACTOS  Take 15 mg by mouth daily with breakfast.     polyethylene glycol packet  Commonly known as:  MIRALAX / GLYCOLAX  Take 17 g by mouth daily as needed (constipation).     pravastatin 20 MG tablet  Commonly known as:  PRAVACHOL  Take 20 mg by mouth at bedtime.     PRESERVISION AREDS 2 PO  Take 2 mg by mouth 2 (two) times daily.     sitaGLIPtin 100 MG tablet  Commonly known as:  JANUVIA  Take 100 mg by mouth daily with breakfast.     traMADol 50 MG tablet  Commonly known as:  ULTRAM  Take 1-2 tablets (50-100 mg total) by mouth every 6 (six) hours as needed (mild pain).     zolpidem 5 MG tablet  Commonly known as:  AMBIEN  Take 5 mg by mouth at bedtime as needed for sleep.           Follow-up Information   Follow up with Loanne Drilling, MD. Schedule an appointment as soon as possible for a visit on 08/01/2013. (Call [phone] tomorrow to make the appointment)    Specialty:  Orthopedic Surgery   Contact information:   [address] [place] [address] [phone]       Signed: Patrica Duel 08/15/2013, 1:36 PM

## 2013-11-12 ENCOUNTER — Telehealth: Payer: Self-pay | Admitting: *Deleted

## 2013-11-12 NOTE — Telephone Encounter (Signed)
CALLED PATIENT TO ASK ABOUT COMING FOR STAT LABS ON 11-25-13, PATIENT AGREED TO COME FOR STAT LABS - 1 HR. PRIOR TO TEST.

## 2013-11-25 ENCOUNTER — Ambulatory Visit (HOSPITAL_COMMUNITY)
Admission: RE | Admit: 2013-11-25 | Discharge: 2013-11-25 | Disposition: A | Discharge status: Home / Self Care | Payer: Medicare Other | Source: Ambulatory Visit | Attending: Radiation Oncology | Admitting: Radiation Oncology

## 2013-11-25 ENCOUNTER — Encounter (HOSPITAL_COMMUNITY): Payer: Self-pay

## 2013-11-25 ENCOUNTER — Ambulatory Visit
Admission: RE | Admit: 2013-11-25 | Discharge: 2013-11-25 | Disposition: A | Discharge status: Home / Self Care | Payer: Medicare Other | Source: Ambulatory Visit | Attending: Radiation Oncology | Admitting: Radiation Oncology

## 2013-11-25 DIAGNOSIS — C3430 Malignant neoplasm of lower lobe, unspecified bronchus or lung: Secondary | ICD-10-CM | POA: Insufficient documentation

## 2013-11-25 DIAGNOSIS — E278 Other specified disorders of adrenal gland: Secondary | ICD-10-CM | POA: Insufficient documentation

## 2013-11-25 DIAGNOSIS — C3490 Malignant neoplasm of unspecified part of unspecified bronchus or lung: Secondary | ICD-10-CM | POA: Insufficient documentation

## 2013-11-25 LAB — BUN AND CREATININE (CC13)
BUN: 33.4 mg/dL — ABNORMAL HIGH (ref 7.0–26.0)
Creatinine: 0.9 mg/dL (ref 0.6–1.1)

## 2013-11-25 MED ORDER — IOHEXOL 300 MG/ML  SOLN: 100.0000 mL | Freq: Once | INTRAMUSCULAR | Status: DC | PRN

## 2013-11-25 MED ORDER — IOHEXOL 300 MG/ML  SOLN
80.0000 mL | Freq: Once | INTRAMUSCULAR | Status: AC | PRN
  Administered 2013-11-25: 80 mL via INTRAVENOUS

## 2013-11-28 ENCOUNTER — Encounter: Payer: Self-pay | Admitting: Radiation Oncology

## 2013-11-28 ENCOUNTER — Ambulatory Visit
Admission: RE | Admit: 2013-11-28 | Discharge: 2013-11-28 | Disposition: A | Discharge status: Home / Self Care | Payer: Medicare Other | Source: Ambulatory Visit | Attending: Radiation Oncology | Admitting: Radiation Oncology

## 2013-11-28 VITALS — BP 133/70 | HR 83 | Temp 98.3°F | Resp 20 | Ht 66.0 in | Wt 148.4 lb

## 2013-11-28 DIAGNOSIS — C3430 Malignant neoplasm of lower lobe, unspecified bronchus or lung: Secondary | ICD-10-CM

## 2013-11-28 NOTE — Progress Notes (Signed)
Follow up s/p rad txs : left lung,  CT Chest 11/25/13 results in here to discuss , no c/o pain, no coughing,  No c/o sob, 97% room air sats , appetite good,  4:41 PM

## 2013-11-28 NOTE — Progress Notes (Signed)
Radiation Oncology         [phone] ________________________________  Name: Stephanie Fletcher MRN: [id number]  Date: 11/28/2013  DOB: [birth year]  Follow-Up Visit Note  CC: Glendon Axe, MD  Melrose Nakayama, *  Diagnosis:   Non-small cell lung cancer  Interval Since Last Radiation:  10 months, SBRT   Narrative:  The patient returns today for routine follow-up.  The patient states she has done well clinically. She denies any changes in terms of shortness of breath. No chest discomfort or esophagitis. Overall she has felt well and has no new complaints. She had a CT scan of the chest completed on 11/25/2013 and she returns to clinic today to review this imaging study.                              ALLERGIES:  is allergic to actos and fosamax.  Meds: Current Outpatient Prescriptions  Medication Sig Dispense Refill  . aspirin 81 MG tablet Take 81 mg by mouth daily.      . CYANOCOBALAMIN IJ Inject as directed every 30 (thirty) days.      [name] dicyclomine (BENTYL) 20 MG tablet Take 20 mg by mouth every 6 (six) hours as needed (for stomach).      . metFORMIN (GLUCOPHAGE) 500 MG tablet Take 500-1,000 mg by mouth 3 (three) times daily. Take 2 tablets (1000 mg) every morning, 1 tablet (500 mg) at noon, 1/2 tablet (250 mg) every evening      . Multiple Vitamins-Minerals (PRESERVISION AREDS 2 PO) Take 2 mg by mouth 2 (two) times daily.      . pioglitazone (ACTOS) 15 MG tablet Take 15 mg by mouth daily with breakfast.       . polyethylene glycol (MIRALAX / GLYCOLAX) packet Take 17 g by mouth daily as needed (constipation).      . pravastatin (PRAVACHOL) 20 MG tablet Take 20 mg by mouth at bedtime.      . sitaGLIPtin (JANUVIA) 100 MG tablet Take 100 mg by mouth daily with breakfast.       . traMADol (ULTRAM) 50 MG tablet Take 1-2 tablets (50-100 mg total) by mouth every 6 (six) hours as needed (mild pain).  60 tablet  0  . valsartan-hydrochlorothiazide (DIOVAN HCT) 160-25 MG per tablet Take 1  tablet by mouth every morning.      [name] omeprazole (PRILOSEC) 20 MG capsule Take 20 mg by mouth 2 (two) times daily as needed (acid reflux).       [name] zolpidem (AMBIEN) 5 MG tablet Take 5 mg by mouth at bedtime as needed for sleep.       No current facility-administered medications for this encounter.    Physical Findings: The patient is in no acute distress. Patient is alert and oriented.  height is 5\' 6"  (1.676 m) and weight is 148 lb 6.4 oz (67.314 kg). Her oral temperature is 98.3 F (36.8 C). Her blood pressure is 133/70 and her pulse is 83. Her respiration is 20 and oxygen saturation is 97%. .   General: Well-developed, in no acute distress HEENT: Normocephalic, atraumatic Cardiovascular: Regular rate and rhythm Respiratory: Clear to auscultation bilaterally GI: Soft, nontender, normal bowel sounds Extremities: No edema present   Lab Findings: Lab Results  Component Value Date   WBC 5.0 07/15/2013   HGB 11.5* 07/15/2013   HCT 35.9* 07/15/2013   MCV 89.8 07/15/2013   PLT 265 07/15/2013     Radiographic  Findings: Ct Chest W Contrast  11/25/2013   CLINICAL DATA:  Left lower lobe lung cancer. Non-small-cell are no carcinoma. Status post stereotactic body radiotherapy.  EXAM: CT CHEST WITH CONTRAST  TECHNIQUE: Multidetector CT imaging of the chest was performed during intravenous contrast administration.  CONTRAST:  44mL OMNIPAQUE IOHEXOL 300 MG/ML  SOLN  COMPARISON:  CT CHEST W/CM dated 05/16/2013; NM PET IMAGE INITIAL (PI) SKULL BASE TO THIGH dated 11/13/2012; CT CHEST W/O CM dated 11/01/2012  FINDINGS: Lungs/Pleura: Minimal nodularity along the right major fissure on image 23. This is likely new.  Patchy radiation changes in the posterior lingula and left lower lobe. These are improved.  Progressive volume loss in the low left lower lobe. The lung mass is difficult to differentiate from presumed surrounding atelectasis. Soft tissue density in this area measures 3.4 x 4.4 cm on image 36 versus  2.3 x 3.6 on the prior. Loss masslike and primarily felt to be indicative of volume loss, when comparing sagittal image 82 today to sagittal image 86 on the prior.  Trace left-sided pleural fluid or thickening, improved.  Heart/Mediastinum: No supraclavicular adenopathy. Aortic and branch vessel atherosclerosis. Mild cardiomegaly with coronary artery atherosclerosis. No central pulmonary embolism, on this non-dedicated study.  Pulmonary artery enlargement, with the outflow tract of 3.3 cm. No mediastinal or hilar adenopathy. A moderate hiatal hernia.  Upper Abdomen: Cholecystectomy. Incompletely imaged left renal cyst. Bilateral adrenal nodules, similar.  Bones/Musculoskeletal: Osteopenia. A moderate compression deformity at the L1 vertebral body is similar.  IMPRESSION: 1. Progressive volume loss in the left lower lobe. This makes delineation of the lung mass difficult. Apparent slightly increased soft tissue density is favored to be related to atelectasis. No convincing evidence of progressive primary tumor. 2. No evidence of metastatic disease. 3. Favor subpleural lymph node or area of fascial thickening along the right major fissure. As this is not readily apparent on the prior, it warrants followup attention. 4. Pulmonary artery enlargement suggests pulmonary arterial hypertension. 5. Moderate hiatal hernia. 6. Similar bilateral adrenal nodules, likely adenomas.   Electronically Signed   By: Abigail Miyamoto M.D.   On: 11/25/2013 12:36    Impression:    The patient is doing well clinically. I reviewed personally the CT scan of the chest. This did not show any convincing evidence of progressive tumor and no evidence of metastatic disease. Some volume loss within the left lower lobe was seen and this will need to be continued to be followed. There also was an area to continue to follow on the right which likely represented a lymph node versus fascial thickening along the right major fissure. I discussed these  results with the patient and all of her questions were answered.  Plan:  We will have the patient return at her request to clinic in 4 months after undergoing a repeat CT scan of the chest.   Jodelle Gross, M.D., Ph.D.

## 2013-11-29 ENCOUNTER — Other Ambulatory Visit: Payer: Self-pay | Admitting: Radiation Oncology

## 2013-11-29 ENCOUNTER — Telehealth: Payer: Self-pay | Admitting: *Deleted

## 2013-11-29 DIAGNOSIS — C3430 Malignant neoplasm of lower lobe, unspecified bronchus or lung: Secondary | ICD-10-CM

## 2013-11-29 NOTE — Telephone Encounter (Signed)
Called patient to inform of test and fu, spoke with patient and she is aware of these appts.

## 2014-03-31 ENCOUNTER — Encounter (HOSPITAL_COMMUNITY): Payer: Self-pay

## 2014-03-31 ENCOUNTER — Ambulatory Visit (HOSPITAL_COMMUNITY)
Admission: RE | Admit: 2014-03-31 | Discharge: 2014-03-31 | Disposition: A | Discharge status: Home / Self Care | Payer: Medicare Other | Source: Ambulatory Visit | Attending: Radiation Oncology | Admitting: Radiation Oncology

## 2014-03-31 ENCOUNTER — Ambulatory Visit
Admission: RE | Admit: 2014-03-31 | Discharge: 2014-03-31 | Disposition: A | Discharge status: Home / Self Care | Payer: Medicare Other | Source: Ambulatory Visit | Attending: Radiation Oncology | Admitting: Radiation Oncology

## 2014-03-31 DIAGNOSIS — E278 Other specified disorders of adrenal gland: Secondary | ICD-10-CM | POA: Insufficient documentation

## 2014-03-31 DIAGNOSIS — C3490 Malignant neoplasm of unspecified part of unspecified bronchus or lung: Secondary | ICD-10-CM | POA: Insufficient documentation

## 2014-03-31 DIAGNOSIS — C3430 Malignant neoplasm of lower lobe, unspecified bronchus or lung: Secondary | ICD-10-CM

## 2014-03-31 DIAGNOSIS — IMO0002 Reserved for concepts with insufficient information to code with codable children: Secondary | ICD-10-CM | POA: Insufficient documentation

## 2014-03-31 DIAGNOSIS — K449 Diaphragmatic hernia without obstruction or gangrene: Secondary | ICD-10-CM | POA: Insufficient documentation

## 2014-03-31 DIAGNOSIS — N281 Cyst of kidney, acquired: Secondary | ICD-10-CM | POA: Insufficient documentation

## 2014-03-31 DIAGNOSIS — I700 Atherosclerosis of aorta: Secondary | ICD-10-CM | POA: Insufficient documentation

## 2014-03-31 DIAGNOSIS — R911 Solitary pulmonary nodule: Secondary | ICD-10-CM | POA: Insufficient documentation

## 2014-03-31 DIAGNOSIS — M4120 Other idiopathic scoliosis, site unspecified: Secondary | ICD-10-CM | POA: Insufficient documentation

## 2014-03-31 LAB — BUN AND CREATININE (CC13)
BUN: 26.6 mg/dL — AB (ref 7.0–26.0)
Creatinine: 0.9 mg/dL (ref 0.6–1.1)

## 2014-03-31 MED ORDER — IOHEXOL 300 MG/ML  SOLN
80.0000 mL | Freq: Once | INTRAMUSCULAR | Status: AC | PRN
  Administered 2014-03-31: 80 mL via INTRAVENOUS

## 2014-04-03 ENCOUNTER — Ambulatory Visit
Admission: RE | Admit: 2014-04-03 | Discharge: 2014-04-03 | Disposition: A | Discharge status: Home / Self Care | Payer: Medicare Other | Source: Ambulatory Visit | Attending: Radiation Oncology | Admitting: Radiation Oncology

## 2014-04-03 ENCOUNTER — Encounter: Payer: Self-pay | Admitting: Radiation Oncology

## 2014-04-03 VITALS — BP 149/39 | HR 68 | Temp 98.1°F | Resp 20 | Wt 146.1 lb

## 2014-04-03 DIAGNOSIS — C3432 Malignant neoplasm of lower lobe, left bronchus or lung: Secondary | ICD-10-CM

## 2014-04-03 NOTE — Progress Notes (Signed)
Patient denies pain, cough, SOB, fatigue, loss of appetite.

## 2014-04-03 NOTE — Progress Notes (Signed)
Radiation Oncology         [phone] ________________________________  Name: Stephanie Fletcher MRN: [id number]  Date: 04/03/2014  DOB: 27-Feb-1929  Follow-Up Visit Note  CC: Glendon Axe, MD  Melrose Nakayama, *  Diagnosis:   Non-small cell lung cancer  Interval Since Last Radiation:  The patient completed radiation treatment in May of 2014   Narrative:  The patient returns today for routine follow-up.  The patient states that she is doing very well at this time. She has no new complaints today. No worsening shortness of breath. No esophagitis. No chest pain and no distant pain today.  The patient recently had a CT scan of the chest and she presents today to review these results.                            ALLERGIES:  is allergic to actos and fosamax.  Meds: Current Outpatient Prescriptions  Medication Sig Dispense Refill  . aspirin 81 MG tablet Take 81 mg by mouth daily.      . CYANOCOBALAMIN IJ Inject as directed every 30 (thirty) days.      [name] dicyclomine (BENTYL) 20 MG tablet Take 20 mg by mouth every 6 (six) hours as needed (for stomach).      [name] glimepiride (AMARYL) 1 MG tablet Take 1 mg by mouth daily with breakfast.      . metFORMIN (GLUCOPHAGE) 500 MG tablet Take 500-1,000 mg by mouth 3 (three) times daily. Take 2 tablets (1000 mg) every morning, 1 tablet (500 mg) at noon, 1/2 tablet (250 mg) every evening      . Multiple Vitamins-Minerals (PRESERVISION AREDS 2 PO) Take 2 mg by mouth 2 (two) times daily.      [name] omeprazole (PRILOSEC) 20 MG capsule Take 20 mg by mouth 2 (two) times daily as needed (acid reflux).       . pioglitazone (ACTOS) 15 MG tablet Take 15 mg by mouth daily with breakfast.       . polyethylene glycol (MIRALAX / GLYCOLAX) packet Take 17 g by mouth daily as needed (constipation).      . pravastatin (PRAVACHOL) 20 MG tablet Take 20 mg by mouth at bedtime.      . sitaGLIPtin (JANUVIA) 100 MG tablet Take 100 mg by mouth daily with breakfast.       .  traMADol (ULTRAM) 50 MG tablet Take 1-2 tablets (50-100 mg total) by mouth every 6 (six) hours as needed (mild pain).  60 tablet  0  . valsartan-hydrochlorothiazide (DIOVAN HCT) 160-25 MG per tablet Take 1 tablet by mouth every morning.      . zolpidem (AMBIEN) 5 MG tablet Take 5 mg by mouth at bedtime as needed for sleep.       No current facility-administered medications for this encounter.    Physical Findings: The patient is in no acute distress. Patient is alert and oriented.  weight is 146 lb 1.6 oz (66.271 kg). Her oral temperature is 98.1 F (36.7 C). Her blood pressure is 149/39 and her pulse is 68. Her respiration is 20 and oxygen saturation is 99%. .   General: Well-developed, in no acute distress HEENT: Normocephalic, atraumatic Cardiovascular: Regular rate and rhythm Respiratory: Clear to auscultation bilaterally GI: Soft, nontender, normal bowel sounds Extremities: No edema present   Lab Findings: Lab Results  Component Value Date   WBC 5.0 07/15/2013   HGB 11.5* 07/15/2013   HCT 35.9* 07/15/2013  MCV 89.8 07/15/2013   PLT 265 07/15/2013     Radiographic Findings: Ct Chest W Contrast  03/31/2014   CLINICAL DATA:  Followup lung cancer  EXAM: CT CHEST WITH CONTRAST  TECHNIQUE: Multidetector CT imaging of the chest was performed during intravenous contrast administration.  CONTRAST:  55mL OMNIPAQUE IOHEXOL 300 MG/ML  SOLN  COMPARISON:  11/25/2013  FINDINGS: There is no pleural effusion identified. Subpleural nodule in the posterior left lower lobe is unchanged, image 27/series 5. Along the major fissure of the right lung is a stable subpleural nodule measuring 4 mm, image 24/series 5. No new or enlarging pulmonary nodules or mass is identified.  Normal heart size. No pericardial effusion. No enlarged mediastinal or hilar lymph nodes. There is a moderate size hiatal hernia. Calcified atherosclerotic disease involves the thoracic aorta as well as the LAD coronary artery. There is  no supraclavicular or axillary adenopathy identified.  Incidental imaging through the upper abdomen shows no acute findings. Nodule in the left adrenal gland measures 1.5 x 1.9 cm, image 50/series 2. Stable from previous exam. The nodule in the right adrenal gland is unchanged measuring 1.5 cm. There is a simple appearing cyst within the upper pole the left kidney measuring 2.5 cm  Review of the visualized osseous structures is significant for scoliosis and multilevel degenerative disc disease.  IMPRESSION: 1. Stable CT of the chest. 2. Similar appearance of volume loss and radiation change within the left lung base. No evidence for metastatic disease. 3. Hiatal hernia. Item number for atherosclerotic disease including coronary artery calcifications 4. Similar bilateral adrenal adenomas   Electronically Signed   By: Kerby Moors M.D.   On: 03/31/2014 13:42    Impression:    The patient remains clinically NED at this time. Her CT scan did not show any evidence of recurrent or metastatic disease.  Plan:  The patient will undergo a repeat CT scan of the chest in 6 months with subsequent followup.   Jodelle Gross, M.D., Ph.D.

## 2014-04-28 ENCOUNTER — Ambulatory Visit: Payer: Self-pay | Admitting: Internal Medicine

## 2014-07-04 ENCOUNTER — Observation Stay: Payer: Self-pay | Admitting: Internal Medicine

## 2014-07-04 LAB — COMPREHENSIVE METABOLIC PANEL
ALBUMIN: 3.2 g/dL — AB (ref 3.4–5.0)
ALK PHOS: 52 U/L
Anion Gap: 5 — ABNORMAL LOW (ref 7–16)
BUN: 35 mg/dL — ABNORMAL HIGH (ref 7–18)
Bilirubin,Total: 0.1 mg/dL — ABNORMAL LOW (ref 0.2–1.0)
Calcium, Total: 9.1 mg/dL (ref 8.5–10.1)
Chloride: 108 mmol/L — ABNORMAL HIGH (ref 98–107)
Co2: 28 mmol/L (ref 21–32)
Creatinine: 1.01 mg/dL (ref 0.60–1.30)
EGFR (African American): >60
GFR CALC NON AF AMER: 56 — AB
Glucose: 100 mg/dL — ABNORMAL HIGH (ref 65–99)
Osmolality: 289 (ref 275–301)
Potassium: 4.6 mmol/L (ref 3.5–5.1)
SGOT(AST): 15 U/L (ref 15–37)
SGPT (ALT): 18 U/L
Sodium: 141 mmol/L (ref 136–145)
TOTAL PROTEIN: 6.7 g/dL (ref 6.4–8.2)

## 2014-07-04 LAB — TROPONIN I: Troponin-I: <0.02 ng/mL

## 2014-07-04 LAB — CBC WITH DIFFERENTIAL/PLATELET
BASOS ABS: 0 10*3/uL (ref 0.0–0.1)
Basophil %: 0.5 %
EOS ABS: 0.1 10*3/uL (ref 0.0–0.7)
EOS PCT: 1.2 %
HCT: 27.5 % — ABNORMAL LOW (ref 35.0–47.0)
HGB: 8.5 g/dL — ABNORMAL LOW (ref 12.0–16.0)
LYMPHS ABS: 0.8 10*3/uL — AB (ref 1.0–3.6)
Lymphocyte %: 12.2 %
MCH: 25.2 pg — ABNORMAL LOW (ref 26.0–34.0)
MCHC: 30.8 g/dL — AB (ref 32.0–36.0)
MCV: 82 fL (ref 80–100)
MONOS PCT: 8.1 %
Monocyte #: 0.5 x10 3/mm (ref 0.2–0.9)
Neutrophil #: 5.1 10*3/uL (ref 1.4–6.5)
Neutrophil %: 78 %
PLATELETS: 247 10*3/uL (ref 150–440)
RBC: 3.36 10*6/uL — AB (ref 3.80–5.20)
RDW: 15.3 % — AB (ref 11.5–14.5)
WBC: 6.5 10*3/uL (ref 3.6–11.0)

## 2014-07-04 LAB — CK TOTAL AND CKMB (NOT AT ARMC)
CK, TOTAL: 88 U/L
CK, Total: 77 U/L
CK-MB: 1.6 ng/mL (ref 0.5–3.6)
CK-MB: 1.4 ng/mL (ref 0.5–3.6)

## 2014-07-05 LAB — CK TOTAL AND CKMB (NOT AT ARMC)
CK, TOTAL: 71 U/L
CK-MB: 1.2 ng/mL (ref 0.5–3.6)

## 2014-07-05 LAB — TROPONIN I: Troponin-I: <0.02 ng/mL

## 2014-09-22 ENCOUNTER — Telehealth: Payer: Self-pay | Admitting: *Deleted

## 2014-09-22 NOTE — Telephone Encounter (Signed)
CALLED PATIENT TO INFORM OF LAB, CT FOR 10-02-14 AND HER FU WITH DR. MOODY ON 10-03-14 @ 3 PM WITH DR. MOODY, SPOKE WITH PATIENT AND SHE IS AWARE OF THESE APPTS.

## 2014-09-23 ENCOUNTER — Ambulatory Visit: Payer: Self-pay | Admitting: Orthopedic Surgery

## 2014-09-23 NOTE — Progress Notes (Signed)
Please put orders in Epic surgery 10-06-14 pre op 09-29-14 Thanks

## 2014-09-23 NOTE — Progress Notes (Signed)
Preoperative surgical orders have been place into the Epic hospital system for De Blanch on 09/23/2014, 1:53 PM  by Mickel Crow for surgery on 10-06-2014.  Preop Total Knee orders including Experal, IV Tylenol, and IV Decadron as long as there are no contraindications to the above medications. Arlee Muslim, PA-C

## 2014-09-24 ENCOUNTER — Ambulatory Visit
Admission: RE | Admit: 2014-09-24 | Discharge: 2014-09-24 | Disposition: A | Discharge status: Home / Self Care | Payer: Medicare Other | Source: Ambulatory Visit | Attending: Radiation Oncology | Admitting: Radiation Oncology

## 2014-09-24 DIAGNOSIS — C3432 Malignant neoplasm of lower lobe, left bronchus or lung: Secondary | ICD-10-CM

## 2014-09-26 NOTE — Patient Instructions (Addendum)
Stephanie Fletcher  09/26/2014   Your procedure is scheduled on: 10/06/14   Report to [facility]  Entrance and follow signs to               [place] at 5:00 AM.   Call this number if you have problems the morning of surgery [phone]   Remember:  Do not eat food or drink liquids :After Midnight.     Take these medicines the morning of surgery with A SIP OF WATER: PANTOPRAZOLE / MAY TAKE TRAMADOL IF NEEDED                               You may not have any metal on your body including hair pins and              piercings  Do not wear jewelry, make-up, lotions, powders or perfumes.             Do not wear nail polish.  Do not shave  48 hours prior to surgery.              Men may shave face and neck.   Do not bring valuables to the hospital. [place].  Contacts, dentures or bridgework may not be worn into surgery.  Leave suitcase in the car. After surgery it may be brought to your room.     Patients discharged the day of surgery will not be allowed to drive home.  Name and phone number of your driver:  Special Instructions: N/A              Please read over the following fact sheets you were given: _____________________________________________________________________                                                     [place]  Before surgery, you can play an important role.  Because skin is not sterile, your skin needs to be as free of germs as possible.  You can reduce the number of germs on your skin by washing with CHG (chlorahexidine gluconate) soap before surgery.  CHG is an antiseptic cleaner which kills germs and bonds with the skin to continue killing germs even after washing. Please DO NOT use if you have an allergy to CHG or antibacterial soaps.  If your skin becomes reddened/irritated stop using the CHG and inform your nurse when you arrive at [facility]  Stay. Do not shave (including legs and underarms) for at least 48 hours prior to the first CHG shower.  You may shave your face. Please follow these instructions carefully:   1.  Shower with CHG Soap the night before surgery and the  morning of Surgery.   2.  If you choose to wash your hair, wash your hair first as usual with your  normal  Shampoo.   3.  After you shampoo, rinse your hair and body thoroughly to remove the  shampoo.  4.  Use CHG as you would any other liquid soap.  You can apply chg directly  to the skin and wash . Gently wash with scrungie or clean wascloth    5.  Apply the CHG Soap to your body ONLY FROM THE NECK DOWN.   Do not use on open                           Wound or open sores. Avoid contact with eyes, ears mouth and genitals (private parts).                        Genitals (private parts) with your normal soap.              6.  Wash thoroughly, paying special attention to the area where your surgery  will be performed.   7.  Thoroughly rinse your body with warm water from the neck down.   8.  DO NOT shower/wash with your normal soap after using and rinsing off  the CHG Soap .                9.  Pat yourself dry with a clean towel.             10.  Wear clean pajamas.             11.  Place clean sheets on your bed the night of your first shower and do not  sleep with pets.  Day of Surgery : Do not apply any lotions/deodorants the morning of surgery.  Please wear clean clothes to the hospital/surgery center.  FAILURE TO FOLLOW THESE INSTRUCTIONS MAY RESULT IN THE CANCELLATION OF YOUR SURGERY    PATIENT SIGNATURE_________________________________  ______________________________________________________________________     Stephanie Fletcher  An incentive spirometer is a tool that can help keep your lungs clear and active. This tool measures how well you are filling your lungs with each breath. Taking long  deep breaths may help reverse or decrease the chance of developing breathing (pulmonary) problems (especially infection) following:  A long period of time when you are unable to move or be active. BEFORE THE PROCEDURE   If the spirometer includes an indicator to show your best effort, your nurse or respiratory therapist will set it to a desired goal.  If possible, sit up straight or lean slightly forward. Try not to slouch.  Hold the incentive spirometer in an upright position. INSTRUCTIONS FOR USE   Sit on the edge of your bed if possible, or sit up as far as you can in bed or on a chair.  Hold the incentive spirometer in an upright position.  Breathe out normally.  Place the mouthpiece in your mouth and seal your lips tightly around it.  Breathe in slowly and as deeply as possible, raising the piston or the ball toward the top of the column.  Hold your breath for 3-5 seconds or for as long as possible. Allow the piston or ball to fall to the bottom of the column.  Remove the mouthpiece from your mouth and breathe out normally.  Rest for a few seconds and repeat Steps 1 through 7 at least 10 times every 1-2 hours when you are awake. Take your time and take a few normal breaths between deep breaths.  The spirometer may include an indicator to show your best effort. Use the indicator as a goal to work toward during  each repetition.  After each set of 10 deep breaths, practice coughing to be sure your lungs are clear. If you have an incision (the cut made at the time of surgery), support your incision when coughing by placing a pillow or rolled up towels firmly against it. Once you are able to get out of bed, walk around indoors and cough well. You may stop using the incentive spirometer when instructed by your caregiver.  RISKS AND COMPLICATIONS  Take your time so you do not get dizzy or light-headed.  If you are in pain, you may need to take or ask for pain medication before doing  incentive spirometry. It is harder to take a deep breath if you are having pain. AFTER USE  Rest and breathe slowly and easily.  It can be helpful to keep track of a log of your progress. Your caregiver can provide you with a simple table to help with this. If you are using the spirometer at home, follow these instructions: [place] IF:   You are having difficultly using the spirometer.  You have trouble using the spirometer as often as instructed.  Your pain medication is not giving enough relief while using the spirometer.  You develop fever of 100.5 F (38.1 C) or higher. SEEK IMMEDIATE MEDICAL CARE IF:   You cough up bloody sputum that had not been present before.  You develop fever of 102 F (38.9 C) or greater.  You develop worsening pain at or near the incision site. MAKE SURE YOU:   Understand these instructions.  Will watch your condition.  Will get help right away if you are not doing well or get worse. Document Released: 01/09/2007 Document Revised: 11/21/2011 Document Reviewed: 03/12/2007 ExitCare Patient Information 2014 ExitCare, [place].   ________________________________________________________________________  WHAT IS A BLOOD TRANSFUSION? Blood Transfusion Information  A transfusion is the replacement of blood or some of its parts. Blood is made up of multiple cells which provide different functions.  Red blood cells carry oxygen and are used for blood loss replacement.  White blood cells fight against infection.  Platelets control bleeding.  Plasma helps clot blood.  Other blood products are available for specialized needs, such as hemophilia or other clotting disorders. BEFORE THE TRANSFUSION  Who gives blood for transfusions?   Healthy volunteers who are fully evaluated to make sure their blood is safe. This is blood bank blood. Transfusion therapy is the safest it has ever been in the practice of medicine. Before blood is taken from a  donor, a complete history is taken to make sure that person has no history of diseases nor engages in risky social behavior (examples are intravenous drug use or sexual activity with multiple partners). The donor's travel history is screened to minimize risk of transmitting infections, such as malaria. The donated blood is tested for signs of infectious diseases, such as HIV and hepatitis. The blood is then tested to be sure it is compatible with you in order to minimize the chance of a transfusion reaction. If you or a relative donates blood, this is often done in anticipation of surgery and is not appropriate for emergency situations. It takes many days to process the donated blood. RISKS AND COMPLICATIONS Although transfusion therapy is very safe and saves many lives, the main dangers of transfusion include:   Getting an infectious disease.  Developing a transfusion reaction. This is an allergic reaction to something in the blood you were given. Every precaution is taken to prevent  this. The decision to have a blood transfusion has been considered carefully by your caregiver before blood is given. Blood is not given unless the benefits outweigh the risks. AFTER THE TRANSFUSION  Right after receiving a blood transfusion, you will usually feel much better and more energetic. This is especially true if your red blood cells have gotten low (anemic). The transfusion raises the level of the red blood cells which carry oxygen, and this usually causes an energy increase.  The nurse administering the transfusion will monitor you carefully for complications. HOME CARE INSTRUCTIONS  No special instructions are needed after a transfusion. You may find your energy is better. Speak with your caregiver about any limitations on activity for underlying diseases you may have. SEEK MEDICAL CARE IF:   Your condition is not improving after your transfusion.  You develop redness or irritation at the intravenous (IV)  site. SEEK IMMEDIATE MEDICAL CARE IF:  Any of the following symptoms occur over the next 12 hours:  Shaking chills.  You have a temperature by mouth above 102 F (38.9 C), not controlled by medicine.  Chest, back, or muscle pain.  People around you feel you are not acting correctly or are confused.  Shortness of breath or difficulty breathing.  Dizziness and fainting.  You get a rash or develop hives.  You have a decrease in urine output.  Your urine turns a dark color or changes to pink, red, or brown. Any of the following symptoms occur over the next 10 days:  You have a temperature by mouth above 102 F (38.9 C), not controlled by medicine.  Shortness of breath.  Weakness after normal activity.  The white part of the eye turns yellow (jaundice).  You have a decrease in the amount of urine or are urinating less often.  Your urine turns a dark color or changes to pink, red, or brown. Document Released: 08/26/2000 Document Revised: 11/21/2011 Document Reviewed: 04/14/2008 [facility] Patient Information 2014 [place], [place].  _______________________________________________________________________

## 2014-09-29 ENCOUNTER — Encounter (HOSPITAL_COMMUNITY): Payer: Self-pay

## 2014-09-29 ENCOUNTER — Encounter (HOSPITAL_COMMUNITY)
Admission: RE | Admit: 2014-09-29 | Discharge: 2014-09-29 | Disposition: A | Discharge status: Home / Self Care | Payer: Medicare Other | Source: Ambulatory Visit | Attending: Orthopedic Surgery | Admitting: Orthopedic Surgery

## 2014-09-29 DIAGNOSIS — Z01812 Encounter for preprocedural laboratory examination: Secondary | ICD-10-CM | POA: Insufficient documentation

## 2014-09-29 DIAGNOSIS — M1711 Unilateral primary osteoarthritis, right knee: Secondary | ICD-10-CM | POA: Insufficient documentation

## 2014-09-29 DIAGNOSIS — Z01818 Encounter for other preprocedural examination: Secondary | ICD-10-CM | POA: Insufficient documentation

## 2014-09-29 HISTORY — DX: Sleep disorder, unspecified: G47.9

## 2014-09-29 HISTORY — DX: Personal history of urinary calculi: Z87.442

## 2014-09-29 HISTORY — DX: Anesthesia of skin: R20.0

## 2014-09-29 HISTORY — DX: Personal history of other malignant neoplasm of bronchus and lung: Z85.118

## 2014-09-29 LAB — COMPREHENSIVE METABOLIC PANEL
ALBUMIN: 3.7 g/dL (ref 3.5–5.2)
ALT: 11 U/L (ref 0–35)
ANION GAP: 8 (ref 5–15)
AST: 18 U/L (ref 0–37)
Alkaline Phosphatase: 51 U/L (ref 39–117)
BUN: 27 mg/dL — ABNORMAL HIGH (ref 6–23)
CHLORIDE: 104 meq/L (ref 96–112)
CO2: 22 mmol/L (ref 19–32)
Calcium: 8.6 mg/dL (ref 8.4–10.5)
Creatinine, Ser: 0.9 mg/dL (ref 0.50–1.10)
GFR calc Af Amer: 67 mL/min — ABNORMAL LOW (ref >90)
GFR calc non Af Amer: 58 mL/min — ABNORMAL LOW (ref >90)
Glucose, Bld: 217 mg/dL — ABNORMAL HIGH (ref 70–99)
Potassium: 4.6 mmol/L (ref 3.5–5.1)
Sodium: 134 mmol/L — ABNORMAL LOW (ref 135–145)
Total Bilirubin: 0.3 mg/dL (ref 0.3–1.2)
Total Protein: 6.2 g/dL (ref 6.0–8.3)

## 2014-09-29 LAB — CBC
HEMATOCRIT: 29.4 % — AB (ref 36.0–46.0)
Hemoglobin: 8.6 g/dL — ABNORMAL LOW (ref 12.0–15.0)
MCH: 25.5 pg — ABNORMAL LOW (ref 26.0–34.0)
MCHC: 29.3 g/dL — AB (ref 30.0–36.0)
MCV: 87.2 fL (ref 78.0–100.0)
Platelets: 248 10*3/uL (ref 150–400)
RBC: 3.37 MIL/uL — AB (ref 3.87–5.11)
RDW: 15 % (ref 11.5–15.5)
WBC: 4.6 10*3/uL (ref 4.0–10.5)

## 2014-09-29 LAB — PROTIME-INR
INR: 1.02 (ref 0.00–1.49)
PROTHROMBIN TIME: 13.5 s (ref 11.6–15.2)

## 2014-09-29 LAB — URINALYSIS, ROUTINE W REFLEX MICROSCOPIC
Bilirubin Urine: NEGATIVE
Glucose, UA: NEGATIVE mg/dL
HGB URINE DIPSTICK: NEGATIVE
Ketones, ur: NEGATIVE mg/dL
Leukocytes, UA: NEGATIVE
NITRITE: NEGATIVE
Protein, ur: NEGATIVE mg/dL
SPECIFIC GRAVITY, URINE: 1.019 (ref 1.005–1.030)
UROBILINOGEN UA: 0.2 mg/dL (ref 0.0–1.0)
pH: 5 (ref 5.0–8.0)

## 2014-09-29 LAB — SURGICAL PCR SCREEN
MRSA, PCR: NEGATIVE
Staphylococcus aureus: NEGATIVE

## 2014-09-29 LAB — APTT: APTT: 31 s (ref 24–37)

## 2014-09-29 NOTE — Progress Notes (Signed)
Abnormal CBC / BMET faxed to Dr. Wynelle Link

## 2014-09-30 ENCOUNTER — Ambulatory Visit: Payer: Self-pay | Admitting: Orthopedic Surgery

## 2014-10-02 ENCOUNTER — Ambulatory Visit: Payer: Medicare Other

## 2014-10-02 ENCOUNTER — Ambulatory Visit (HOSPITAL_COMMUNITY): Payer: Medicare Other

## 2014-10-03 ENCOUNTER — Ambulatory Visit: Payer: Self-pay | Admitting: Radiation Oncology

## 2014-10-05 ENCOUNTER — Ambulatory Visit: Payer: Self-pay | Admitting: Orthopedic Surgery
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
## 2014-10-06 ENCOUNTER — Inpatient Hospital Stay (HOSPITAL_COMMUNITY): Payer: Medicare Other | Admitting: Certified Registered Nurse Anesthetist

## 2014-10-06 ENCOUNTER — Encounter (HOSPITAL_COMMUNITY): Payer: Self-pay | Admitting: *Deleted

## 2014-10-06 ENCOUNTER — Encounter (HOSPITAL_COMMUNITY)
Admission: RE | Disposition: A | Discharge status: Skilled Nursing Facility | Payer: Self-pay | Source: Ambulatory Visit | Attending: Orthopedic Surgery

## 2014-10-06 ENCOUNTER — Inpatient Hospital Stay (HOSPITAL_COMMUNITY)
Admission: RE | Admit: 2014-10-06 | Discharge: 2014-10-09 | DRG: 470 | Disposition: A | Discharge status: Skilled Nursing Facility | Payer: Medicare Other | Source: Ambulatory Visit | Attending: Orthopedic Surgery | Admitting: Orthopedic Surgery

## 2014-10-06 DIAGNOSIS — I10 Essential (primary) hypertension: Secondary | ICD-10-CM | POA: Diagnosis present

## 2014-10-06 DIAGNOSIS — Z7982 Long term (current) use of aspirin: Secondary | ICD-10-CM | POA: Diagnosis not present

## 2014-10-06 DIAGNOSIS — Z923 Personal history of irradiation: Secondary | ICD-10-CM

## 2014-10-06 DIAGNOSIS — B269 Mumps without complication: Secondary | ICD-10-CM | POA: Diagnosis present

## 2014-10-06 DIAGNOSIS — M1711 Unilateral primary osteoarthritis, right knee: Principal | ICD-10-CM | POA: Diagnosis present

## 2014-10-06 DIAGNOSIS — Z85118 Personal history of other malignant neoplasm of bronchus and lung: Secondary | ICD-10-CM

## 2014-10-06 DIAGNOSIS — E780 Pure hypercholesterolemia: Secondary | ICD-10-CM | POA: Diagnosis present

## 2014-10-06 DIAGNOSIS — D649 Anemia, unspecified: Secondary | ICD-10-CM | POA: Diagnosis present

## 2014-10-06 DIAGNOSIS — E1140 Type 2 diabetes mellitus with diabetic neuropathy, unspecified: Secondary | ICD-10-CM | POA: Diagnosis present

## 2014-10-06 DIAGNOSIS — H269 Unspecified cataract: Secondary | ICD-10-CM | POA: Diagnosis present

## 2014-10-06 DIAGNOSIS — Z87442 Personal history of urinary calculi: Secondary | ICD-10-CM

## 2014-10-06 DIAGNOSIS — M25561 Pain in right knee: Secondary | ICD-10-CM | POA: Diagnosis present

## 2014-10-06 DIAGNOSIS — K219 Gastro-esophageal reflux disease without esophagitis: Secondary | ICD-10-CM | POA: Diagnosis present

## 2014-10-06 DIAGNOSIS — M179 Osteoarthritis of knee, unspecified: Secondary | ICD-10-CM | POA: Diagnosis present

## 2014-10-06 DIAGNOSIS — Z8711 Personal history of peptic ulcer disease: Secondary | ICD-10-CM | POA: Diagnosis not present

## 2014-10-06 DIAGNOSIS — Z720 Tobacco use: Secondary | ICD-10-CM

## 2014-10-06 DIAGNOSIS — H3530 Unspecified macular degeneration: Secondary | ICD-10-CM | POA: Diagnosis present

## 2014-10-06 DIAGNOSIS — I739 Peripheral vascular disease, unspecified: Secondary | ICD-10-CM | POA: Diagnosis present

## 2014-10-06 DIAGNOSIS — K449 Diaphragmatic hernia without obstruction or gangrene: Secondary | ICD-10-CM | POA: Diagnosis present

## 2014-10-06 DIAGNOSIS — Z96659 Presence of unspecified artificial knee joint: Secondary | ICD-10-CM | POA: Diagnosis present

## 2014-10-06 DIAGNOSIS — M1710 Unilateral primary osteoarthritis, unspecified knee: Secondary | ICD-10-CM | POA: Diagnosis present

## 2014-10-06 HISTORY — PX: TOTAL KNEE ARTHROPLASTY: SHX125

## 2014-10-06 LAB — GLUCOSE, CAPILLARY
GLUCOSE-CAPILLARY: 140 mg/dL — AB (ref 70–99)
GLUCOSE-CAPILLARY: 195 mg/dL — AB (ref 70–99)
GLUCOSE-CAPILLARY: 194 mg/dL — AB (ref 70–99)
Glucose-Capillary: 107 mg/dL — ABNORMAL HIGH (ref 70–99)
Glucose-Capillary: 178 mg/dL — ABNORMAL HIGH (ref 70–99)

## 2014-10-06 SURGERY — ARTHROPLASTY, KNEE, TOTAL
Anesthesia: Spinal | Site: Knee | Laterality: Right

## 2014-10-06 MED ORDER — HYDROCHLOROTHIAZIDE 25 MG PO TABS
25.0000 mg | ORAL_TABLET | Freq: Every day | ORAL | Status: DC
  Administered 2014-10-06 – 2014-10-08 (×2): 25 mg via ORAL
  Filled 2014-10-06 (×4): qty 1

## 2014-10-06 MED ORDER — MORPHINE SULFATE 2 MG/ML IJ SOLN
INTRAMUSCULAR | Status: AC
  Filled 2014-10-06: qty 1

## 2014-10-06 MED ORDER — CEFAZOLIN SODIUM-DEXTROSE 2-3 GM-% IV SOLR
2.0000 g | INTRAVENOUS | Status: AC
  Administered 2014-10-06: 2 g via INTRAVENOUS

## 2014-10-06 MED ORDER — METOCLOPRAMIDE HCL 5 MG/ML IJ SOLN: 5.0000 mg | Freq: Three times a day (TID) | INTRAMUSCULAR | Status: DC | PRN

## 2014-10-06 MED ORDER — SODIUM CHLORIDE 0.9 % IR SOLN
Status: DC | PRN
  Administered 2014-10-06: 1000 mL

## 2014-10-06 MED ORDER — DEXAMETHASONE SODIUM PHOSPHATE 10 MG/ML IJ SOLN
10.0000 mg | Freq: Once | INTRAMUSCULAR | Status: AC
  Administered 2014-10-07: 10 mg via INTRAVENOUS
  Filled 2014-10-06: qty 1

## 2014-10-06 MED ORDER — MENTHOL 3 MG MT LOZG
1.0000 | LOZENGE | OROMUCOSAL | Status: DC | PRN
  Filled 2014-10-06: qty 9

## 2014-10-06 MED ORDER — RIVAROXABAN 10 MG PO TABS
10.0000 mg | ORAL_TABLET | Freq: Every day | ORAL | Status: DC
  Administered 2014-10-07 – 2014-10-09 (×3): 10 mg via ORAL
  Filled 2014-10-06 (×4): qty 1

## 2014-10-06 MED ORDER — ONDANSETRON HCL 4 MG/2ML IJ SOLN
4.0000 mg | Freq: Four times a day (QID) | INTRAMUSCULAR | Status: DC | PRN
  Administered 2014-10-07: 4 mg via INTRAVENOUS
  Filled 2014-10-06: qty 2

## 2014-10-06 MED ORDER — PANTOPRAZOLE SODIUM 40 MG PO TBEC
40.0000 mg | DELAYED_RELEASE_TABLET | Freq: Every day | ORAL | Status: DC
  Administered 2014-10-07 – 2014-10-09 (×3): 40 mg via ORAL
  Filled 2014-10-06 (×3): qty 1

## 2014-10-06 MED ORDER — 0.9 % SODIUM CHLORIDE (POUR BTL) OPTIME
TOPICAL | Status: DC | PRN
  Administered 2014-10-06: 1000 mL

## 2014-10-06 MED ORDER — SODIUM CHLORIDE 0.9 % IV SOLN
INTRAVENOUS | Status: DC
  Administered 2014-10-06: 14:00:00 via INTRAVENOUS

## 2014-10-06 MED ORDER — ACETAMINOPHEN 500 MG PO TABS
1000.0000 mg | ORAL_TABLET | Freq: Four times a day (QID) | ORAL | Status: AC
  Administered 2014-10-06 – 2014-10-07 (×4): 1000 mg via ORAL
  Filled 2014-10-06 (×4): qty 2

## 2014-10-06 MED ORDER — SODIUM CHLORIDE 0.9 % IJ SOLN
INTRAMUSCULAR | Status: DC | PRN
  Administered 2014-10-06: 30 mL

## 2014-10-06 MED ORDER — PROMETHAZINE HCL 25 MG/ML IJ SOLN: 6.2500 mg | INTRAMUSCULAR | Status: DC | PRN

## 2014-10-06 MED ORDER — ONDANSETRON HCL 4 MG/2ML IJ SOLN
INTRAMUSCULAR | Status: DC | PRN
  Administered 2014-10-06: 4 mg via INTRAVENOUS

## 2014-10-06 MED ORDER — PROPOFOL 10 MG/ML IV BOLUS
INTRAVENOUS | Status: AC
  Filled 2014-10-06: qty 20

## 2014-10-06 MED ORDER — BUPIVACAINE IN DEXTROSE 0.75-8.25 % IT SOLN
INTRATHECAL | Status: DC | PRN
  Administered 2014-10-06: 1.6 mL via INTRATHECAL

## 2014-10-06 MED ORDER — MIDAZOLAM HCL 5 MG/5ML IJ SOLN
INTRAMUSCULAR | Status: DC | PRN
  Administered 2014-10-06: .25 mg via INTRAVENOUS

## 2014-10-06 MED ORDER — MEPERIDINE HCL 50 MG/ML IJ SOLN: 6.2500 mg | INTRAMUSCULAR | Status: DC | PRN

## 2014-10-06 MED ORDER — FENTANYL CITRATE 0.05 MG/ML IJ SOLN: 25.0000 ug | INTRAMUSCULAR | Status: DC | PRN

## 2014-10-06 MED ORDER — BUPIVACAINE HCL 0.25 % IJ SOLN
INTRAMUSCULAR | Status: DC | PRN
  Administered 2014-10-06: 20 mL

## 2014-10-06 MED ORDER — IRBESARTAN 150 MG PO TABS
150.0000 mg | ORAL_TABLET | Freq: Every day | ORAL | Status: DC
  Administered 2014-10-06 – 2014-10-08 (×2): 150 mg via ORAL
  Filled 2014-10-06 (×4): qty 1

## 2014-10-06 MED ORDER — SODIUM CHLORIDE 0.9 % IJ SOLN
INTRAMUSCULAR | Status: AC
  Filled 2014-10-06: qty 50

## 2014-10-06 MED ORDER — DOCUSATE SODIUM 100 MG PO CAPS
100.0000 mg | ORAL_CAPSULE | Freq: Two times a day (BID) | ORAL | Status: DC
  Administered 2014-10-06 – 2014-10-09 (×6): 100 mg via ORAL

## 2014-10-06 MED ORDER — FENTANYL CITRATE 0.05 MG/ML IJ SOLN
INTRAMUSCULAR | Status: DC | PRN
  Administered 2014-10-06 (×2): 50 ug via INTRAVENOUS

## 2014-10-06 MED ORDER — ONDANSETRON HCL 4 MG PO TABS: 4.0000 mg | ORAL_TABLET | Freq: Four times a day (QID) | ORAL | Status: DC | PRN

## 2014-10-06 MED ORDER — SODIUM CHLORIDE 0.9 % IV SOLN: INTRAVENOUS | Status: DC

## 2014-10-06 MED ORDER — ACETAMINOPHEN 650 MG RE SUPP: 650.0000 mg | Freq: Four times a day (QID) | RECTAL | Status: DC | PRN

## 2014-10-06 MED ORDER — MORPHINE SULFATE 2 MG/ML IJ SOLN
2.0000 mg | INTRAMUSCULAR | Status: DC | PRN
  Administered 2014-10-06 (×3): 2 mg via INTRAVENOUS
  Filled 2014-10-06 (×2): qty 1

## 2014-10-06 MED ORDER — OXYCODONE HCL 5 MG PO TABS
5.0000 mg | ORAL_TABLET | ORAL | Status: DC | PRN
Start: 2014-10-06 — End: 2014-10-09
  Administered 2014-10-06 (×3): 10 mg via ORAL
  Administered 2014-10-06: 5 mg via ORAL
  Administered 2014-10-07: 10 mg via ORAL
  Administered 2014-10-07: 5 mg via ORAL
  Administered 2014-10-07 (×2): 10 mg via ORAL
  Administered 2014-10-08 (×2): 5 mg via ORAL
  Administered 2014-10-08: 10 mg via ORAL
  Administered 2014-10-08: 5 mg via ORAL
  Administered 2014-10-09 (×2): 10 mg via ORAL
  Filled 2014-10-06 (×2): qty 1
  Filled 2014-10-06 (×3): qty 2
  Filled 2014-10-06: qty 1
  Filled 2014-10-06: qty 2
  Filled 2014-10-06: qty 1
  Filled 2014-10-06 (×2): qty 2
  Filled 2014-10-06: qty 1
  Filled 2014-10-06 (×4): qty 2

## 2014-10-06 MED ORDER — CHLORHEXIDINE GLUCONATE 4 % EX LIQD: 60.0000 mL | Freq: Once | CUTANEOUS | Status: DC

## 2014-10-06 MED ORDER — FLEET ENEMA 7-19 GM/118ML RE ENEM: 1.0000 | ENEMA | Freq: Once | RECTAL | Status: AC | PRN

## 2014-10-06 MED ORDER — ACETAMINOPHEN 10 MG/ML IV SOLN
INTRAVENOUS | Status: DC | PRN
  Administered 2014-10-06: 1000 mg via INTRAVENOUS

## 2014-10-06 MED ORDER — BISACODYL 10 MG RE SUPP: 10.0000 mg | Freq: Every day | RECTAL | Status: DC | PRN

## 2014-10-06 MED ORDER — METFORMIN HCL 500 MG PO TABS
1000.0000 mg | ORAL_TABLET | Freq: Every day | ORAL | Status: DC
  Administered 2014-10-07: 1000 mg via ORAL
  Filled 2014-10-06 (×2): qty 2

## 2014-10-06 MED ORDER — DICYCLOMINE HCL 20 MG PO TABS
20.0000 mg | ORAL_TABLET | Freq: Four times a day (QID) | ORAL | Status: DC | PRN
  Filled 2014-10-06: qty 1

## 2014-10-06 MED ORDER — LINAGLIPTIN 5 MG PO TABS
5.0000 mg | ORAL_TABLET | Freq: Every day | ORAL | Status: DC
  Administered 2014-10-07 – 2014-10-09 (×3): 5 mg via ORAL
  Filled 2014-10-06 (×3): qty 1

## 2014-10-06 MED ORDER — METOCLOPRAMIDE HCL 10 MG PO TABS
5.0000 mg | ORAL_TABLET | Freq: Three times a day (TID) | ORAL | Status: DC | PRN
  Administered 2014-10-07: 10 mg via ORAL
  Filled 2014-10-06: qty 1

## 2014-10-06 MED ORDER — PHENOL 1.4 % MT LIQD
1.0000 | OROMUCOSAL | Status: DC | PRN
  Filled 2014-10-06: qty 177

## 2014-10-06 MED ORDER — ZOLPIDEM TARTRATE 5 MG PO TABS: 5.0000 mg | ORAL_TABLET | Freq: Every evening | ORAL | Status: DC | PRN

## 2014-10-06 MED ORDER — CEFAZOLIN SODIUM-DEXTROSE 2-3 GM-% IV SOLR
2.0000 g | Freq: Four times a day (QID) | INTRAVENOUS | Status: AC
  Administered 2014-10-06 (×2): 2 g via INTRAVENOUS
  Filled 2014-10-06 (×2): qty 50

## 2014-10-06 MED ORDER — ACETAMINOPHEN 325 MG PO TABS: 650.0000 mg | ORAL_TABLET | Freq: Four times a day (QID) | ORAL | Status: DC | PRN

## 2014-10-06 MED ORDER — PIOGLITAZONE HCL 15 MG PO TABS
15.0000 mg | ORAL_TABLET | Freq: Every day | ORAL | Status: DC
  Administered 2014-10-07 – 2014-10-09 (×3): 15 mg via ORAL
  Filled 2014-10-06 (×4): qty 1

## 2014-10-06 MED ORDER — INSULIN ASPART 100 UNIT/ML ~~LOC~~ SOLN
0.0000 [IU] | Freq: Three times a day (TID) | SUBCUTANEOUS | Status: DC
  Administered 2014-10-06: 3 [IU] via SUBCUTANEOUS
  Administered 2014-10-07: 2 [IU] via SUBCUTANEOUS
  Administered 2014-10-07: 8 [IU] via SUBCUTANEOUS
  Administered 2014-10-08: 11 [IU] via SUBCUTANEOUS
  Administered 2014-10-08: 5 [IU] via SUBCUTANEOUS
  Administered 2014-10-09: 3 [IU] via SUBCUTANEOUS
  Administered 2014-10-09: 11 [IU] via SUBCUTANEOUS

## 2014-10-06 MED ORDER — BUPIVACAINE HCL (PF) 0.25 % IJ SOLN
INTRAMUSCULAR | Status: AC
  Filled 2014-10-06: qty 30

## 2014-10-06 MED ORDER — BUPIVACAINE LIPOSOME 1.3 % IJ SUSP
20.0000 mL | Freq: Once | INTRAMUSCULAR | Status: DC
  Filled 2014-10-06: qty 20

## 2014-10-06 MED ORDER — DIPHENHYDRAMINE HCL 12.5 MG/5ML PO ELIX: 12.5000 mg | ORAL_SOLUTION | ORAL | Status: DC | PRN

## 2014-10-06 MED ORDER — BUPIVACAINE LIPOSOME 1.3 % IJ SUSP
INTRAMUSCULAR | Status: DC | PRN
  Administered 2014-10-06: 20 mL

## 2014-10-06 MED ORDER — METHOCARBAMOL 500 MG PO TABS
500.0000 mg | ORAL_TABLET | Freq: Four times a day (QID) | ORAL | Status: DC | PRN
  Administered 2014-10-06 – 2014-10-09 (×5): 500 mg via ORAL
  Filled 2014-10-06 (×5): qty 1

## 2014-10-06 MED ORDER — TRANEXAMIC ACID 100 MG/ML IV SOLN
2000.0000 mg | INTRAVENOUS | Status: DC | PRN
  Administered 2014-10-06: 2000 mg via TOPICAL

## 2014-10-06 MED ORDER — GLIMEPIRIDE 2 MG PO TABS
2.0000 mg | ORAL_TABLET | Freq: Every day | ORAL | Status: DC
  Administered 2014-10-07 – 2014-10-09 (×3): 2 mg via ORAL
  Filled 2014-10-06 (×4): qty 1

## 2014-10-06 MED ORDER — POLYETHYLENE GLYCOL 3350 17 G PO PACK
17.0000 g | PACK | Freq: Every day | ORAL | Status: DC | PRN
  Administered 2014-10-08 – 2014-10-09 (×2): 17 g via ORAL
  Filled 2014-10-06 (×2): qty 1

## 2014-10-06 MED ORDER — DEXAMETHASONE SODIUM PHOSPHATE 10 MG/ML IJ SOLN: 10.0000 mg | Freq: Once | INTRAMUSCULAR | Status: DC

## 2014-10-06 MED ORDER — METFORMIN HCL 500 MG PO TABS
500.0000 mg | ORAL_TABLET | Freq: Two times a day (BID) | ORAL | Status: DC
  Administered 2014-10-06 (×2): 500 mg via ORAL
  Filled 2014-10-06 (×4): qty 1

## 2014-10-06 MED ORDER — VALSARTAN-HYDROCHLOROTHIAZIDE 160-25 MG PO TABS: 1.0000 | ORAL_TABLET | Freq: Every morning | ORAL | Status: DC

## 2014-10-06 MED ORDER — FENTANYL CITRATE 0.05 MG/ML IJ SOLN
INTRAMUSCULAR | Status: AC
  Filled 2014-10-06: qty 2

## 2014-10-06 MED ORDER — TRAMADOL HCL 50 MG PO TABS
50.0000 mg | ORAL_TABLET | Freq: Four times a day (QID) | ORAL | Status: DC | PRN
  Administered 2014-10-06: 100 mg via ORAL
  Filled 2014-10-06: qty 2

## 2014-10-06 MED ORDER — MORPHINE SULFATE 2 MG/ML IJ SOLN
1.0000 mg | INTRAMUSCULAR | Status: DC | PRN
  Administered 2014-10-06: 1 mg via INTRAVENOUS
  Filled 2014-10-06: qty 1

## 2014-10-06 MED ORDER — LACTATED RINGERS IV SOLN
INTRAVENOUS | Status: DC | PRN
  Administered 2014-10-06 (×2): via INTRAVENOUS

## 2014-10-06 MED ORDER — CEFAZOLIN SODIUM-DEXTROSE 2-3 GM-% IV SOLR
INTRAVENOUS | Status: AC
  Filled 2014-10-06: qty 50

## 2014-10-06 MED ORDER — MIDAZOLAM HCL 2 MG/2ML IJ SOLN
INTRAMUSCULAR | Status: AC
  Filled 2014-10-06: qty 2

## 2014-10-06 MED ORDER — ACETAMINOPHEN 10 MG/ML IV SOLN
1000.0000 mg | Freq: Once | INTRAVENOUS | Status: DC
  Filled 2014-10-06: qty 100

## 2014-10-06 MED ORDER — EPHEDRINE SULFATE 50 MG/ML IJ SOLN
INTRAMUSCULAR | Status: DC | PRN
  Administered 2014-10-06 (×3): 5 mg via INTRAVENOUS

## 2014-10-06 MED ORDER — TRANEXAMIC ACID 100 MG/ML IV SOLN
2000.0000 mg | Freq: Once | INTRAVENOUS | Status: DC
  Filled 2014-10-06: qty 20

## 2014-10-06 MED ORDER — PROPOFOL INFUSION 10 MG/ML OPTIME
INTRAVENOUS | Status: DC | PRN
  Administered 2014-10-06: 140 ug/kg/min via INTRAVENOUS

## 2014-10-06 MED ORDER — METHOCARBAMOL 1000 MG/10ML IJ SOLN
500.0000 mg | Freq: Four times a day (QID) | INTRAVENOUS | Status: DC | PRN
  Administered 2014-10-06: 500 mg via INTRAVENOUS
  Filled 2014-10-06 (×2): qty 5

## 2014-10-06 SURGICAL SUPPLY — 64 items
BAG ZIPLOCK 12X15 (MISCELLANEOUS) ×3 IMPLANT
BANDAGE ELASTIC 6 VELCRO ST LF (GAUZE/BANDAGES/DRESSINGS) ×3 IMPLANT
BANDAGE ESMARK 6X9 LF (GAUZE/BANDAGES/DRESSINGS) ×1 IMPLANT
BLADE SAG 18X100X1.27 (BLADE) ×3 IMPLANT
BLADE SAW SGTL 11.0X1.19X90.0M (BLADE) ×3 IMPLANT
BLADE SAW SGTL 13.0X1.19X90.0M (BLADE) ×3 IMPLANT
BNDG ESMARK 6X9 LF (GAUZE/BANDAGES/DRESSINGS) ×3
BOWL SMART MIX CTS (DISPOSABLE) ×3 IMPLANT
CAP KNEE TOTAL 3 SIGMA ×3 IMPLANT
CEMENT HV SMART SET (Cement) ×6 IMPLANT
CLOSURE WOUND 1/2 X4 (GAUZE/BANDAGES/DRESSINGS) ×1
CUFF TOURN SGL QUICK 34 (TOURNIQUET CUFF) ×2
CUFF TRNQT CYL 34X4X40X1 (TOURNIQUET CUFF) ×1 IMPLANT
DECANTER SPIKE VIAL GLASS SM (MISCELLANEOUS) ×3 IMPLANT
DRAPE EXTREMITY T 121X128X90 (DRAPE) ×3 IMPLANT
DRAPE POUCH INSTRU U-SHP 10X18 (DRAPES) ×3 IMPLANT
DRAPE U-SHAPE 47X51 STRL (DRAPES) ×3 IMPLANT
DRSG ADAPTIC 3X8 NADH LF (GAUZE/BANDAGES/DRESSINGS) ×3 IMPLANT
DRSG PAD ABDOMINAL 8X10 ST (GAUZE/BANDAGES/DRESSINGS) ×3 IMPLANT
DURAPREP 26ML APPLICATOR (WOUND CARE) ×3 IMPLANT
ELECT REM PT RETURN 9FT ADLT (ELECTROSURGICAL) ×3
ELECTRODE REM PT RTRN 9FT ADLT (ELECTROSURGICAL) ×1 IMPLANT
EVACUATOR 1/8 PVC DRAIN (DRAIN) ×3 IMPLANT
FACESHIELD WRAPAROUND (MASK) ×15 IMPLANT
GAUZE SPONGE 4X4 12PLY STRL (GAUZE/BANDAGES/DRESSINGS) ×3 IMPLANT
GLOVE BIO SURGEON STRL SZ7 (GLOVE) ×3 IMPLANT
GLOVE BIO SURGEON STRL SZ8 (GLOVE) ×3 IMPLANT
GLOVE BIOGEL PI IND STRL 6.5 (GLOVE) ×1 IMPLANT
GLOVE BIOGEL PI IND STRL 7.0 (GLOVE) ×1 IMPLANT
GLOVE BIOGEL PI IND STRL 7.5 (GLOVE) ×1 IMPLANT
GLOVE BIOGEL PI IND STRL 8 (GLOVE) ×1 IMPLANT
GLOVE BIOGEL PI INDICATOR 6.5 (GLOVE) ×2
GLOVE BIOGEL PI INDICATOR 7.0 (GLOVE) ×2
GLOVE BIOGEL PI INDICATOR 7.5 (GLOVE) ×2
GLOVE BIOGEL PI INDICATOR 8 (GLOVE) ×2
GLOVE SURG SS PI 6.5 STRL IVOR (GLOVE) ×3 IMPLANT
GLOVE SURG SS PI 7.5 STRL IVOR (GLOVE) ×3 IMPLANT
GOWN STRL REUS W/TWL LRG LVL3 (GOWN DISPOSABLE) ×6 IMPLANT
GOWN STRL REUS W/TWL XL LVL3 (GOWN DISPOSABLE) ×3 IMPLANT
HANDPIECE INTERPULSE COAX TIP (DISPOSABLE) ×2
IMMOBILIZER KNEE 20 (SOFTGOODS) ×3
IMMOBILIZER KNEE 20 THIGH 36 (SOFTGOODS) ×1 IMPLANT
KIT BASIN OR (CUSTOM PROCEDURE TRAY) ×3 IMPLANT
MANIFOLD NEPTUNE II (INSTRUMENTS) ×3 IMPLANT
NDL SAFETY ECLIPSE 18X1.5 (NEEDLE) ×2 IMPLANT
NEEDLE HYPO 18GX1.5 SHARP (NEEDLE) ×4
NS IRRIG 1000ML POUR BTL (IV SOLUTION) ×3 IMPLANT
PACK TOTAL JOINT (CUSTOM PROCEDURE TRAY) ×3 IMPLANT
PADDING CAST COTTON 6X4 STRL (CAST SUPPLIES) ×3 IMPLANT
POSITIONER SURGICAL ARM (MISCELLANEOUS) ×3 IMPLANT
SET HNDPC FAN SPRY TIP SCT (DISPOSABLE) ×1 IMPLANT
STRIP CLOSURE SKIN 1/2X4 (GAUZE/BANDAGES/DRESSINGS) ×2 IMPLANT
SUCTION FRAZIER 12FR DISP (SUCTIONS) ×3 IMPLANT
SUT MNCRL AB 4-0 PS2 18 (SUTURE) ×3 IMPLANT
SUT VIC AB 2-0 CT1 27 (SUTURE) ×6
SUT VIC AB 2-0 CT1 TAPERPNT 27 (SUTURE) ×3 IMPLANT
SUT VLOC 180 0 24IN GS25 (SUTURE) ×3 IMPLANT
SYR 20CC LL (SYRINGE) ×3 IMPLANT
SYR 50ML LL SCALE MARK (SYRINGE) ×6 IMPLANT
TOWEL OR 17X26 10 PK STRL BLUE (TOWEL DISPOSABLE) ×3 IMPLANT
TOWEL OR NON WOVEN STRL DISP B (DISPOSABLE) ×3 IMPLANT
TRAY FOLEY CATH 14FRSI W/METER (CATHETERS) ×3 IMPLANT
WATER STERILE IRR 1500ML POUR (IV SOLUTION) ×3 IMPLANT
WRAP KNEE MAXI GEL POST OP (GAUZE/BANDAGES/DRESSINGS) ×3 IMPLANT

## 2014-10-06 NOTE — Progress Notes (Signed)
Utilization review completed.  

## 2014-10-06 NOTE — Evaluation (Signed)
Physical Therapy Evaluation Patient Details Name: Stephanie Fletcher MRN: [id number] DOB: 1929/03/01 Today's Date: 10/06/2014   History of Present Illness  81 yo female s/p R TKA 10/06/14. Hx of L TKA 2014, L peroneal nerve decompression 2014, DM, neuropathy. Pt is from [place] Impression  On eval POD 0, pt required Min assist for bed mobility-able to sit EOB for at least 12 minutes. Pt became nauseous and vomited so assisted pt back to bed. Pt did not feel up to standing. Will follow during stay.     Follow Up Recommendations SNF;Supervision/Assistance - 24 hour    Equipment Recommendations  None recommended by PT    Recommendations for Other Services       Precautions / Restrictions Precautions Precautions: Fall Required Braces or Orthoses: Knee Immobilizer - Right Knee Immobilizer - Right: Discontinue once straight leg raise with < 10 degree lag Restrictions Weight Bearing Restrictions: No RLE Weight Bearing: Weight bearing as tolerated      Mobility  Bed Mobility Overal bed mobility: Needs Assistance Bed Mobility: Supine to Sit;Sit to Supine     Supine to sit: Min assist Sit to supine: Min assist   General bed mobility comments: Increased time. Assist for R LE.   Transfers                 General transfer comment: Deferred-pt only felt up to sitting EOB  Ambulation/Gait                Stairs            Wheelchair Mobility    Modified Rankin (Stroke Patients Only)       Balance                                             Pertinent Vitals/Pain Pain Assessment: 0-10 Pain Score: 8  Pain Location: R knee Pain Descriptors / Indicators: Aching;Sore Pain Intervention(s): Limited activity within patient's tolerance;Premedicated before session;Ice applied    Home Living Family/patient expects to be discharged to:: Skilled nursing facility Living Arrangements: Alone   Type of Home: Independent  living facility Home Access: Level entry     Home Layout: One level Home Equipment: Walker - 2 wheels;Cane - single point;Grab bars - toilet;Grab bars - tub/shower      Prior Function Level of Independence: Independent with assistive device(s)               Hand Dominance        Extremity/Trunk Assessment               Lower Extremity Assessment: RLE deficits/detail;LLE deficits/detail RLE Deficits / Details: hip flex 2/5, hip abd/add 2/5, moves ankle okay LLE Deficits / Details: Decreased DF/PF. Residual lateral lower leg, foot decreased sensation  Cervical / Trunk Assessment: Kyphotic  Communication   Communication: No difficulties  Cognition Arousal/Alertness: Awake/alert Behavior During Therapy: WFL for tasks assessed/performed Overall Cognitive Status: Within Functional Limits for tasks assessed                      General Comments      Exercises        Assessment/Plan    PT Assessment Patient needs continued PT services  PT Diagnosis Difficulty walking;Generalized weakness;Acute pain   PT Problem List Decreased strength;Decreased range of motion;Decreased activity tolerance;Decreased balance;Decreased  mobility;Pain;Decreased knowledge of use of DME;Decreased knowledge of precautions  PT Treatment Interventions DME instruction;Gait training;Functional mobility training;Therapeutic activities;Therapeutic exercise;Patient/family education;Balance training   PT Goals (Current goals can be found in the Care Plan section) Acute Rehab PT Goals Patient Stated Goal: to go to rehab to regain independence PT Goal Formulation: With patient Time For Goal Achievement: 10/13/14 Potential to Achieve Goals: Good    Frequency 7X/week   Barriers to discharge        Co-evaluation               End of Session   Activity Tolerance: Patient limited by pain Patient left: in bed;with call bell/phone within reach           Time:  1600-1630 PT Time Calculation (min) (ACUTE ONLY): 30 min   Charges:   PT Evaluation $Initial PT Evaluation Tier I: 1 Procedure PT Treatments $Therapeutic Activity: 23-37 mins   PT G Codes:        Weston Anna, MPT Pager: [phone]

## 2014-10-06 NOTE — Interval H&P Note (Signed)
History and Physical Interval Note:  10/06/2014 6:55 AM  De Blanch  has presented today for surgery, with the diagnosis of OA right knee  The various methods of treatment have been discussed with the patient and family. After consideration of risks, benefits and other options for treatment, the patient has consented to  Procedure(s): RIGHT TOTAL KNEE ARTHROPLASTY (Right) as a surgical intervention .  The patient's history has been reviewed, patient examined, no change in status, stable for surgery.  I have reviewed the patient's chart and labs.  Questions were answered to the patient's satisfaction.     Gearlean Alf

## 2014-10-06 NOTE — Progress Notes (Signed)
Clinical Social Work Department BRIEF PSYCHOSOCIAL ASSESSMENT 10/06/2014  Patient:  Stephanie Fletcher, Stephanie Fletcher     Account Number:  [id number]     [place] date:  10/06/2014  Clinical Social Worker:  Lacie Scotts  Date/Time:  10/06/2014 01:45 PM  Referred by:  Physician  Date Referred:  10/06/2014 Referred for  SNF Placement   Other Referral:   Interview type:  Patient Other interview type:    PSYCHOSOCIAL DATA Living Status:  FACILITY Admitted from facility:  [facility] Level of care:  Independent Living Primary support name:  Leone Payor Primary support relationship to patient:  FAMILY Degree of support available:   supportive    CURRENT CONCERNS Current Concerns  Post-Acute Placement   Other Concerns:    SOCIAL WORK ASSESSMENT / PLAN Pt is an 81 yr old female admitted from [facility]. CSW met with pt to assist with d/c planning. This is a planned surgery. Pt has made arrangements to have ST Rehab at [facility] ( rehab unit ) following hospital d/c. CSW has left a message with Admissions at SNF to confirm plan. CSW will continue to follow to assist with d/c planning to SNF.   Assessment/plan status:  Psychosocial Support/Ongoing Assessment of Needs Other assessment/ plan:   Information/referral to community resources:   Insurance coverage for SNF and ambulance transport reviewed.    PATIENT'S/FAMILY'S RESPONSE TO PLAN OF CARE: Pt is relieved her surgery is over. She is motivated to begin working with therapy. " I've been to the rehab unit at [facility] following my last surgery. I'm looking forward to returning for rehab. "   Werner Lean LCSW [phone]

## 2014-10-06 NOTE — Discharge Instructions (Addendum)
° °Dr. Frank Aluisio °Total Joint Specialist °[facility] °[address] °[place], [place] [address] °[phone] ° °TOTAL KNEE REPLACEMENT POSTOPERATIVE DIRECTIONS ° ° ° °Knee Rehabilitation, Guidelines Following Surgery  °Results after knee surgery are often greatly improved when you follow the exercise, range of motion and muscle strengthening exercises prescribed by your doctor. Safety measures are also important to protect the knee from further injury. Any time any of these exercises cause you to have increased pain or swelling in your knee joint, decrease the amount until you are comfortable again and slowly increase them. If you have problems or questions, call your caregiver or physical therapist for advice.  ° °HOME CARE INSTRUCTIONS  °Remove items at home which could result in a fall. This includes throw rugs or furniture in walking pathways.  °Continue medications as instructed at time of discharge. °You may have some home medications which will be placed on hold until you complete the course of blood thinner medication.  °You may start showering once you are discharged home but do not submerge the incision under water. Just pat the incision dry and apply a dry gauze dressing on daily. °Walk with walker as instructed.  °You may resume a sexual relationship in one month or when given the OK by  your doctor.  °· Use walker as long as suggested by your caregivers. °· Avoid periods of inactivity such as sitting longer than an hour when not asleep. This helps prevent blood clots.  °You may put full weight on your legs and walk as much as is comfortable.  °You may return to work once you are cleared by your doctor.  °Do not drive a car for 6 weeks or until released by you surgeon.  °· Do not drive while taking narcotics.  °Wear the elastic stockings for three weeks following surgery during the day but you may remove then at night. °Make sure you keep all of your appointments after your  operation with all of your doctors and caregivers. You should call the office at the above phone number and make an appointment for approximately two weeks after the date of your surgery. °Change the dressing daily and reapply a dry dressing each time. °Please pick up a stool softener and laxative for home use as long as you are requiring pain medications. °· ICE to the affected knee every three hours for 30 minutes at a time and then as needed for pain and swelling.  Continue to use ice on the knee for pain and swelling from surgery. You may notice swelling that will progress down to the foot and ankle.  This is normal after surgery.  Elevate the leg when you are not up walking on it.   °It is important for you to complete the blood thinner medication as prescribed by your doctor. °· Continue to use the breathing machine which will help keep your temperature down.  It is common for your temperature to cycle up and down following surgery, especially at night when you are not up moving around and exerting yourself.  The breathing machine keeps your lungs expanded and your temperature down. ° °RANGE OF MOTION AND STRENGTHENING EXERCISES  °Rehabilitation of the knee is important following a knee injury or an operation. After just a few days of immobilization, the muscles of the thigh which control the knee become weakened and shrink (atrophy). Knee exercises are designed to build up the tone and strength of the thigh muscles and to improve knee   motion. Often times heat used for twenty to thirty minutes before working out will loosen up your tissues and help with improving the range of motion but do not use heat for the first two weeks following surgery. These exercises can be done on a training (exercise) mat, on the floor, on a table or on a bed. Use what ever works the best and is most comfortable for you Knee exercises include:  Leg Lifts - While your knee is still immobilized in a splint or cast, you can do  straight leg raises. Lift the leg to 60 degrees, hold for 3 sec, and slowly lower the leg. Repeat 10-20 times 2-3 times daily. Perform this exercise against resistance later as your knee gets better.  Quad and Hamstring Sets - Tighten up the muscle on the front of the thigh (Quad) and hold for 5-10 sec. Repeat this 10-20 times hourly. Hamstring sets are done by pushing the foot backward against an object and holding for 5-10 sec. Repeat as with quad sets.  A rehabilitation program following serious knee injuries can speed recovery and prevent re-injury in the future due to weakened muscles. Contact your doctor or a physical therapist for more information on knee rehabilitation.   SKILLED REHAB INSTRUCTIONS: If the patient is transferred to a skilled rehab facility following release from the hospital, a list of the current medications will be sent to the facility for the patient to continue.  When discharged from the skilled rehab facility, please have the facility set up the patient's [place] prior to being released. Also, the skilled facility will be responsible for providing the patient with their medications at time of release from the facility to include their pain medication, the muscle relaxants, and their blood thinner medication. If the patient is still at the rehab facility at time of the two week follow up appointment, the skilled rehab facility will also need to assist the patient in arranging follow up appointment in our office and any transportation needs.  MAKE SURE YOU:  Understand these instructions.  Will watch your condition.  Will get help right away if you are not doing well or get worse.    Pick up stool softner and laxative for home use following surgery while on pain medications. Do not submerge incision under water. Please use good hand washing techniques while changing dressing each day. May shower starting three days after surgery. Please use a clean  towel to pat the incision dry following showers. Continue to use ice for pain and swelling after surgery. Do not use any lotions or creams on the incision until instructed by your surgeon.  Take Xarelto for two and a half more weeks, then discontinue Xarelto. Once the patient has completed the Xarelto, they may resume the 81 mg Aspirin.  Postoperative Constipation Protocol  Constipation - defined medically as fewer than three stools per week and severe constipation as less than one stool per week.  One of the most common issues patients have following surgery is constipation.  Even if you have a regular bowel pattern at home, your normal regimen is likely to be disrupted due to multiple reasons following surgery.  Combination of anesthesia, postoperative narcotics, change in appetite and fluid intake all can affect your bowels.  In order to avoid complications following surgery, here are some recommendations in order to help you during your recovery period.  Colace (docusate) - Pick up an over-the-counter form of Colace or another stool softener and take  twice a day as long as you are requiring postoperative pain medications.  Take with a full glass of water daily.  If you experience loose stools or diarrhea, hold the colace until you stool forms back up.  If your symptoms do not get better within 1 week or if they get worse, check with your doctor.  Dulcolax (bisacodyl) - Pick up over-the-counter and take as directed by the product packaging as needed to assist with the movement of your bowels.  Take with a full glass of water.  Use this product as needed if not relieved by Colace only.   MiraLax (polyethylene glycol) - Pick up over-the-counter to have on hand.  MiraLax is a solution that will increase the amount of water in your bowels to assist with bowel movements.  Take as directed and can mix with a glass of water, juice, soda, coffee, or tea.  Take if you go more than two days without a  movement. Do not use MiraLax more than once per day. Call your doctor if you are still constipated or irregular after using this medication for 7 days in a row.  If you continue to have problems with postoperative constipation, please contact the office for further assistance and recommendations.  If you experience "the worst abdominal pain ever" or develop nausea or vomiting, please contact the office immediatly for further recommendations for treatment.   When discharged from the skilled rehab facility, please have the facility set up the patient's [place] prior to being released.   Also provide the patient with their medications at time of release from the facility to include their pain medication, the muscle relaxants, and their blood thinner medication.  If the patient is still at the rehab facility at time of follow up appointment, please also assist the patient in arranging follow up appointment in our office and any transportation needs. ICE to the affected knee or hip every three hours for 30 minutes at a time and then as needed for pain and swelling.   Information on my medicine - XARELTO (Rivaroxaban)  This medication education was reviewed with me or my healthcare representative as part of my discharge preparation.  The pharmacist that spoke with me during my hospital stay was:  [facility]  Why was Xarelto prescribed for you? Xarelto was prescribed for you to reduce the risk of blood clots forming after orthopedic surgery. The medical term for these abnormal blood clots is venous thromboembolism (VTE).  What do you need to know about xarelto ? Take your Xarelto ONCE DAILY at the same time every day. You may take it either with or without food.  If you have difficulty swallowing the tablet whole, you may crush it and mix in applesauce just prior to taking your dose.  Take Xarelto exactly as prescribed by your doctor and DO NOT stop taking  Xarelto without talking to the doctor who prescribed the medication.  Stopping without other VTE prevention medication to take the place of Xarelto may increase your risk of developing a clot.  After discharge, you should have regular check-up appointments with your healthcare provider that is prescribing your Xarelto.    What do you do if you miss a dose? If you miss a dose, take it as soon as you remember on the same day then continue your regularly scheduled once daily regimen the next day. Do not take two doses of Xarelto on the same day.   Important Safety Information  A possible side effect of Xarelto is bleeding. You should call your healthcare provider right away if you experience any of the following: ? Bleeding from an injury or your nose that does not stop. ? Unusual colored urine (red or dark brown) or unusual colored stools (red or black). ? Unusual bruising for unknown reasons. ? A serious fall or if you hit your head (even if there is no bleeding).  Some medicines may interact with Xarelto and might increase your risk of bleeding while on Xarelto. To help avoid this, consult your healthcare provider or pharmacist prior to using any new prescription or non-prescription medications, including herbals, vitamins, non-steroidal anti-inflammatory drugs (NSAIDs) and supplements.  This website has more information on Xarelto: [email or web address]

## 2014-10-06 NOTE — Progress Notes (Signed)
Clinical Social Work Department CLINICAL SOCIAL WORK PLACEMENT NOTE 10/06/2014  Patient:  MAKAYLYN, SINYARD  Account Number:  [id number] Admit date:  10/06/2014  Clinical Social Worker:  Werner Lean, LCSW  Date/time:  10/06/2014 01:54 PM  Clinical Social Work is seeking post-discharge placement for this patient at the following level of care:   SKILLED NURSING   (*CSW will update this form in Epic as items are completed)     Patient/family provided with [facility] Department of Clinical Social Work's list of facilities offering this level of care within the geographic area requested by the patient (or if unable, by the patient's family).  10/06/2014  Patient/family informed of their freedom to choose among providers that offer the needed level of care, that participate in Medicare, Medicaid or managed care program needed by the patient, have an available bed and are willing to accept the patient.    Patient/family informed of [facility]' ownership interest in [facility], as well as of the fact that they are under no obligation to receive care at this facility.  PASARR submitted to EDS on 10/06/2014 PASARR number received on 10/06/2014  FL2 transmitted to all facilities in geographic area requested by pt/family on  10/06/2014 FL2 transmitted to all facilities within larger geographic area on   Patient informed that his/her managed care company has contracts with or will negotiate with  certain facilities, including the following:     Patient/family informed of bed offers received:  10/06/2014 Patient chooses bed at [facility] Physician recommends and patient chooses bed at    Patient to be transferred to  on   Patient to be transferred to facility by  Patient and family notified of transfer on  Name of family member notified:    The following physician request were entered in Epic:   Additional Comments:  Werner Lean LCSW [phone]

## 2014-10-06 NOTE — Anesthesia Preprocedure Evaluation (Addendum)
Anesthesia Evaluation  Patient identified by MRN, date of birth, ID band Patient awake    Reviewed: Allergy & Precautions, H&P , NPO status , Patient's Chart, lab work & pertinent test results  Airway Mallampati: II  TM Distance: >3 FB Neck ROM: Full    Dental no notable dental hx.    Pulmonary  Cancer   adenocarcinoma/  lung  breath sounds clear to auscultation  Pulmonary exam normal       Cardiovascular hypertension, Pt. on medications DVT Rhythm:Regular Rate:Normal     Neuro/Psych negative neurological ROS  negative psych ROS   GI/Hepatic negative GI ROS, Neg liver ROS,   Endo/Other  diabetes, Type 2  Renal/GU negative Renal ROS  negative genitourinary   Musculoskeletal negative musculoskeletal ROS (+)   Abdominal   Peds negative pediatric ROS (+)  Hematology negative hematology ROS (+)   Anesthesia Other Findings   Reproductive/Obstetrics negative OB ROS                             Anesthesia Physical  Anesthesia Plan  ASA: III  Anesthesia Plan: Spinal   Post-op Pain Management:    Induction:   Airway Management Planned: Simple Face Mask  Additional Equipment:   Intra-op Plan:   Post-operative Plan:   Informed Consent: I have reviewed the patients History and Physical, chart, labs and discussed the procedure including the risks, benefits and alternatives for the proposed anesthesia with the patient or authorized representative who has indicated his/her understanding and acceptance.   Dental advisory given  Plan Discussed with: CRNA and Surgeon  Anesthesia Plan Comments:        Anesthesia Quick Evaluation

## 2014-10-06 NOTE — Op Note (Signed)
Pre-operative diagnosis- Osteoarthritis Right knee(s)  Post-operative diagnosis- Osteoarthritis  Right knee(s)  Procedure-   Right Total Knee Arthroplasty  Surgeon- Dione Plover. Graziella Connery, MD  Assistant- Ardeen Jourdain, PA-C   Anesthesia-  Spinal   EBL- * No blood loss amount entered *   Drains Hemovac   Tourniquet time  Total Tourniquet Time Documented: Thigh (Right) - 27 minutes Total: Thigh (Right) - 27 minutes    Complications- None  Condition-PACU - hemodynamically stable.   Brief Clinical Note  Stephanie Fletcher is a 81 y.o. year old female with end stage OA of her right knee with progressively worsening pain and dysfunction. She has constant pain, with activity and at rest and significant functional deficits with difficulties even with ADLs. She has had extensive non-op management including analgesics, injections of cortisone and viscosupplements, and home exercise program, but remains in significant pain with significant dysfunction.Radiographs show bone on bone arthritis lateral and patellofemoral with valgus deformity. She presents now for right Total Knee Arthroplasty.    Procedure in detail---       The patient is brought into the operating room and positioned supine on the operating table. After successful administration of Spinal anesthetic, a tourniquet is placed high on the Right thigh(s) and the lower extremity is prepped and draped in the usual sterile fashion. Time out is performed by the operating team and then the Right  lower extremity is wrapped in Esmarch, knee flexed and the tourniquet inflated to 300 mmHg.       A midline incision is made with a ten blade through the subcutaneous tissue to the level of the extensor mechanism. A fresh blade is used to make a lateral parapatellar arthrotomy due to the patients' valgus deformity. Soft tissue over the proximal lateral tibia is subperiosteally elevated to the joint line with a knife to the posterolateral corner but  not including the structures of the posterolateral corner. Soft tissue over the proximal medial tibia is elevated with attention being paid to avoiding the patellar tendon on the tibial tubercle. The patella is everted medially, knee flexed 90 degrees and the ACL and PCL are removed. Findings are bone on bone lateral and patellofemoral with large lateral osteophytes. .       The drill is used to create a starting hole in the distal femur and the canal is thoroughly irrigated with sterile saline to remove the fatty contents. The 5 degree Right  valgus alignment guide is placed into the femoral canal and the distal femoral cutting block is pinned to remove 10  mm off the distal femur. Resection is made with an oscillating saw.      The tibia is subluxed forward and the menisci are removed. The extramedullary alignment guide is placed referencing proximally at the medial aspect of the tibial tubercle and distally along the second metatarsal axis and tibial crest. The block is pinned to remove 71mm off the more deficient lateral side. Resection is made with an oscillating saw. Size 3  is the most appropriate size for the tibia and the proximal tibia is prepared with the modular drill and keel punch for that size.      The femoral sizing guide is placed and size 3  is most appropriate. Rotation is marked off the epicondylar axis and confirmed by creating a rectangular flexion gap at 90 degrees. The size 3  cutting block is pinned in this rotation and the anterior, posterior and chamfer cuts are made with the oscillating saw. The  intercondylar block is then placed and that cut is made.      Trial size 3  tibial component, trial size 3  posterior stabilized femur and a 10  mm posterior stabilized rotating platform insert trial is placed. Full extension is achieved with excellent varus/valgus and   anterior/posterior balance throughout full range of motion. The patella is everted and thickness measured to be 22  mm. Free  hand resection is taken to 12 mm, a 35 template is placed, lug holes are drilled, trial patella is placed, and it tracks normally. Osteophytes are removed off the posterior femur with the trial in place. All trials are removed and the cut bone surfaces prepared with pulsatile lavage. Cement is mixed and once ready for implantation, the size 3  tibial implant, size 3 posterior stabilized femoral component, and the size 35  patella are cemented in place and the patella is held with the clamp. The trial insert is placed and the knee held in full extension. The Exparel (20 ml mixed with 30 ml saline) and then 20 ml of .25% Bupivicaine is injected into the extensor mechanism, posterior capsule, medial and lateral gutters and subcutaneous tissues. All extruded cement is removed and once the cement is hard the permanent 10  mm posterior stabilized rotating platform insert is placed into the tibial tray.      The wound is copiously irrigated with saline solution and the tourniquet is released for a total   tourniquet time of 27  minutes. Bleeding is identified and controlled with electrocautery. The extensor mechanism is closed with interrupted #1 V-loc leaving open a small area from the superior to inferior pole of the patella to serve as a mini lateral release. Flexion against gravity is 140  degrees and the patella tracks normally. Subcutaneous tissue is closed with 2.0 vicryl and subcuticular with running 4.0 Monocryl.The incision is cleaned and dried and steri-strips and a bulky sterile dressing are applied. The limb is placed into a knee immobilizer and the patient is awakened and transported to recovery in stable condition.      Please note that a surgical assistant was a medical necessity for this procedure in order to perform it in a safe and expeditious manner. Surgical assistant was necessary to retract the ligaments and vital neurovascular structures to prevent injury to them and also necessary for proper  positioning of the limb to allow for anatomic placement of the prosthesis.    Dione Plover Bluma Buresh, MD    10/06/2014, 8:15 AM

## 2014-10-06 NOTE — Anesthesia Postprocedure Evaluation (Signed)
  Anesthesia Post-op Note  Patient: Stephanie Fletcher  Procedure(s) Performed: Procedure(s) (LRB): RIGHT TOTAL KNEE ARTHROPLASTY (Right)  Patient Location: PACU  Anesthesia Type: Spinal  Level of Consciousness: awake and alert   Airway and Oxygen Therapy: Patient Spontanous Breathing  Post-op Pain: mild  Post-op Assessment: Post-op Vital signs reviewed, Patient's Cardiovascular Status Stable, Respiratory Function Stable, Patent Airway and No signs of Nausea or vomiting  Last Vitals:  Filed Vitals:   10/06/14 1331  BP: 149/41  Pulse: 68  Temp: 36.9 C  Resp: 16    Post-op Vital Signs: stable   Complications: No apparent anesthesia complications
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
## 2014-10-06 NOTE — Transfer of Care (Signed)
Immediate Anesthesia Transfer of Care Note  Patient: Stephanie Fletcher  Procedure(s) Performed: Procedure(s): RIGHT TOTAL KNEE ARTHROPLASTY (Right)  Patient Location: PACU  Anesthesia Type:Spinal  Level of Consciousness: awake, alert , oriented, patient cooperative and responds to stimulation  Airway & Oxygen Therapy: Patient Spontanous Breathing and Patient connected to face mask oxygen  Post-op Assessment: Report given to PACU RN and Post -op Vital signs reviewed and stable  Post vital signs: Reviewed and stable  Complications: No apparent anesthesia complications

## 2014-10-06 NOTE — Anesthesia Procedure Notes (Signed)
Spinal Patient location during procedure: OR Start time: 10/06/2014 7:12 AM Staffing Anesthesiologist: Montez Hageman Performed by: anesthesiologist  Preanesthetic Checklist Completed: patient identified, site marked, surgical consent, pre-op evaluation, timeout performed, IV checked, risks and benefits discussed and monitors and equipment checked Spinal Block Patient position: sitting Prep: Betadine Patient monitoring: heart rate, continuous pulse ox and blood pressure Approach: right paramedian Location: L2-3 Injection technique: single-shot Needle Needle type: Spinocan  Needle gauge: 22 G Needle length: 9 cm Additional Notes Kit expiration date checked.  Negative heme, paresthesia.  Tolerated well.

## 2014-10-07 ENCOUNTER — Encounter (HOSPITAL_COMMUNITY): Payer: Self-pay | Admitting: Orthopedic Surgery

## 2014-10-07 LAB — PREPARE RBC (CROSSMATCH)

## 2014-10-07 LAB — BASIC METABOLIC PANEL
Anion gap: 6 (ref 5–15)
BUN: 20 mg/dL (ref 6–23)
CHLORIDE: 105 mmol/L (ref 96–112)
CO2: 26 mmol/L (ref 19–32)
Calcium: 8.3 mg/dL — ABNORMAL LOW (ref 8.4–10.5)
Creatinine, Ser: 0.9 mg/dL (ref 0.50–1.10)
GFR, EST AFRICAN AMERICAN: 67 mL/min — AB (ref >90)
GFR, EST NON AFRICAN AMERICAN: 58 mL/min — AB (ref >90)
Glucose, Bld: 177 mg/dL — ABNORMAL HIGH (ref 70–99)
Potassium: 4.1 mmol/L (ref 3.5–5.1)
Sodium: 137 mmol/L (ref 135–145)

## 2014-10-07 LAB — CBC
HCT: 23.5 % — ABNORMAL LOW (ref 36.0–46.0)
Hemoglobin: 7 g/dL — ABNORMAL LOW (ref 12.0–15.0)
MCH: 26 pg (ref 26.0–34.0)
MCHC: 29.8 g/dL — ABNORMAL LOW (ref 30.0–36.0)
MCV: 87.4 fL (ref 78.0–100.0)
Platelets: 219 10*3/uL (ref 150–400)
RBC: 2.69 MIL/uL — ABNORMAL LOW (ref 3.87–5.11)
RDW: 15 % (ref 11.5–15.5)
WBC: 4.9 10*3/uL (ref 4.0–10.5)

## 2014-10-07 LAB — GLUCOSE, CAPILLARY
GLUCOSE-CAPILLARY: 145 mg/dL — AB (ref 70–99)
Glucose-Capillary: 113 mg/dL — ABNORMAL HIGH (ref 70–99)
Glucose-Capillary: 272 mg/dL — ABNORMAL HIGH (ref 70–99)
Glucose-Capillary: 106 mg/dL — ABNORMAL HIGH (ref 70–99)

## 2014-10-07 MED ORDER — FUROSEMIDE 10 MG/ML IJ SOLN
10.0000 mg | Freq: Once | INTRAMUSCULAR | Status: AC
  Administered 2014-10-07: 10 mg via INTRAVENOUS
  Filled 2014-10-07: qty 1

## 2014-10-07 MED ORDER — SODIUM CHLORIDE 0.9 % IV SOLN: Freq: Once | INTRAVENOUS | Status: DC

## 2014-10-07 MED ORDER — ACETAMINOPHEN 325 MG PO TABS: 650.0000 mg | ORAL_TABLET | Freq: Once | ORAL | Status: DC

## 2014-10-07 NOTE — Progress Notes (Signed)
Physical Therapy Treatment Patient Details Name: Stephanie Fletcher MRN: [id number] DOB: 02-07-1929 Today's Date: 10/07/2014    History of Present Illness 81 yo female s/p R TKA 10/06/14. Hx of L TKA 2014, L peroneal nerve decompression 2014, DM, neuropathy. Pt is from [place]    PT Comments    1 session today between transfusion. Pt able to take a few steps today. Limited by pain, fatigue.   Follow Up Recommendations  SNF;Supervision/Assistance - 24 hour     Equipment Recommendations  None recommended by PT    Recommendations for Other Services OT consult     Precautions / Restrictions Precautions Precautions: Fall Required Braces or Orthoses: Knee Immobilizer - Right Knee Immobilizer - Right: Discontinue once straight leg raise with < 10 degree lag Restrictions Weight Bearing Restrictions: No RLE Weight Bearing: Weight bearing as tolerated    Mobility  Bed Mobility Overal bed mobility: Needs Assistance Bed Mobility: Supine to Sit     Supine to sit: Min assist     General bed mobility comments: Increased time. Assist for R LE.   Transfers Overall transfer level: Needs assistance Equipment used: Rolling walker (2 wheeled) Transfers: Sit to/from Stand Sit to Stand: Mod assist         General transfer comment: Assist to rise, stabilize, control descent. VCs safety, technique, hand placement.   Ambulation/Gait Ambulation/Gait assistance: Min assist Ambulation Distance (Feet): 5 Feet Assistive device: Rolling walker (2 wheeled)       General Gait Details: Multimodal cues for safety, posture, step length, distance from walker. Assist to stabilize pt and maneuver safely with walker. distance limited by pain, fatigue. Recliner brought up behind pt to sit.    Stairs            Wheelchair Mobility    Modified Rankin (Stroke Patients Only)       Balance                                    Cognition Arousal/Alertness:  Awake/alert Behavior During Therapy: WFL for tasks assessed/performed Overall Cognitive Status: Within Functional Limits for tasks assessed                      Exercises Total Joint Exercises Ankle Circles/Pumps: AROM;Both;10 reps;Supine Quad Sets: AROM;Both;10 reps;Supine Heel Slides: AAROM;Right;10 reps;Supine Hip ABduction/ADduction: AAROM;Right;10 reps;Supine Straight Leg Raises: AAROM;Right;10 reps;Supine Goniometric ROM: 10-45 degrees    General Comments        Pertinent Vitals/Pain Pain Assessment: Faces Pain Score: 8  Pain Location: R knee Pain Descriptors / Indicators: Sharp;Aching;Sore Pain Intervention(s): Monitored during session;Limited activity within patient's tolerance;Repositioned;Ice applied    Home Living                      Prior Function            PT Goals (current goals can now be found in the care plan section) Progress towards PT goals: Progressing toward goals (slowly)    Frequency  7X/week    PT Plan Current plan remains appropriate    Co-evaluation             End of Session Equipment Utilized During Treatment: Gait belt Activity Tolerance: Patient limited by fatigue;Patient limited by pain Patient left: in chair;with call bell/phone within reach;with family/visitor present     Time: 0240-9735 PT Time Calculation (min) (ACUTE ONLY): 28  min  Charges:  $Gait Training: 8-22 mins $Therapeutic Exercise: 8-22 mins                    G Codes:      Weston Anna, MPT Pager: [phone]

## 2014-10-07 NOTE — Progress Notes (Signed)
Subjective: 1 Day Post-Op Procedure(s) (LRB): RIGHT TOTAL KNEE ARTHROPLASTY (Right) Patient reports pain as mild and moderate last night.  Better today. Patient seen in rounds with Dr. Wynelle Link. Patient is well, but has had some minor complaints of pain in the knee, requiring pain medications We will start therapy today.  Plan is to go to [facility] where she is a a resident after hospital stay.  Objective: Vital signs in last 24 hours: Temp:  [96.8 F (36 C)-98.7 F (37.1 C)] 98.5 F (36.9 C) (01/26 0520) Pulse Rate:  [59-71] 69 (01/26 0520) Resp:  [10-20] 20 (01/26 0520) BP: (109-167)/(33-64) 109/43 mmHg (01/26 0520) SpO2:  [99 %-100 %] 99 % (01/26 0520) Weight:  [67.359 kg (148 lb 8 oz)] 67.359 kg (148 lb 8 oz) (01/25 1025)  Intake/Output from previous day:  Intake/Output Summary (Last 24 hours) at 10/07/14 0817 Last data filed at 10/07/14 0700  Gross per 24 hour  Intake 4098.75 ml  Output   2525 ml  Net 1573.75 ml    Intake/Output this shift: UOP 200 since MN  Labs:  Recent Labs  10/07/14 0530  HGB 7.0*    Recent Labs  10/07/14 0530  WBC 4.9  RBC 2.69*  HCT 23.5*  PLT 219    Recent Labs  10/07/14 0530  NA 137  K 4.1  CL 105  CO2 26  BUN 20  CREATININE 0.90  GLUCOSE 177*  CALCIUM 8.3*   No results for input(s): LABPT, INR in the last 72 hours.  EXAM General - Patient is Alert, Appropriate and Oriented Extremity - Neurovascular intact Sensation intact distally Dorsiflexion/Plantar flexion intact Dressing - dressing C/D/I Motor Function - intact, moving foot and toes well on exam.  Hemovac pulled without difficulty.  Past Medical History  Diagnosis Date  . Hypercholesterolemia   . Hypertension   . Diverticulitis   . Gastric ulcer   . Arthritis   . Anemia   . Back injury 1998    T-12 fracture (brace)  . Nodule of left lung   . GERD (gastroesophageal reflux disease)   . History of radiation therapy 12/31/2012-01/10/2013    62  gray to left lower lung  . Peripheral vascular disease 1957/1997    DVT bilateral following fall  . Diabetes mellitus without complication     type 2  . Hx of cancer of lung 2014    tx with radiation only  . Numbness     left lower leg since left total knee nerve damage  . History of kidney stones     x1 attack many yrs ago  . Difficulty sleeping   . Cancer 2014    adenocarcinoma/  lung    Assessment/Plan: 1 Day Post-Op Procedure(s) (LRB): RIGHT TOTAL KNEE ARTHROPLASTY (Right) Active Problems:   OA (osteoarthritis) of knee  Estimated body mass index is 23.98 kg/(m^2) as calculated from the following:   Height as of this encounter: 5\' 6"  (1.676 m).   Weight as of this encounter: 67.359 kg (148 lb 8 oz). Advance diet Up with therapy Plan for discharge tomorrow if bed available at [facility] HGB is down to 7.0 (preop started anemic at 8.6) Will give blood today and recheck labs in the morning. Leave foley in for blood and DC foley later this afternoon following blood transfusion.  DVT Prophylaxis - Xarelto Weight-Bearing as tolerated to right leg D/C O2 and Pulse OX and try on Room Air  Arlee Muslim, PA-C Orthopaedic Surgery 10/07/2014, 8:17 AM

## 2014-10-07 NOTE — Progress Notes (Signed)
PT Cancellation Note  Patient Details Name: Stephanie Fletcher MRN: [id number] DOB: Mar 19, 1929   Cancelled Treatment:    Reason Eval/Treat Not Completed: Medical issues which prohibited therapy--pt currently finishing 1st unit of blood and eating lunch. Will check back later for PT tx session.    Weston Anna, MPT Pager: [phone]

## 2014-10-07 NOTE — Progress Notes (Signed)
OT Cancellation Note  Patient Details Name: Stephanie Fletcher MRN: [id number] DOB: [birth year]   Cancelled Treatment:    Reason Eval/Treat Not Completed: Other (comment).  Plan is for transfusion.  Will check back this pm or tomorrow.  Marvelous Woolford 10/07/2014, 9:18 AM  Lesle Chris, OTR/L [phone] 10/07/2014

## 2014-10-07 NOTE — Care Management Note (Signed)
    Page 1 of 1   10/07/2014     11:18:17 AM CARE MANAGEMENT NOTE 10/07/2014  Patient:  Stephanie Fletcher, Stephanie Fletcher   Account Number:  [id number]  Date Initiated:  10/07/2014  Documentation initiated by:  [facility]  Subjective/Objective Assessment:   adm: RIGHT TOTAL KNEE ARTHROPLASTY (Right)     Action/Plan:   discharge planning   Anticipated DC Date:  10/08/2014   Anticipated DC Plan:  [place]  CM consult      Choice offered to / List presented to:             Status of service:  Completed, signed off Medicare Important Message given?   (If response is "NO", the following Medicare IM given date fields will be blank) Date Medicare IM given:   Medicare IM given by:   Date Additional Medicare IM given:   Additional Medicare IM given by:    Discharge Disposition:  [place]  Per UR Regulation:    If discussed at Long Length of Stay Meetings, dates discussed:    Comments:  10/07/14 08:00 CM notes pt will discharge to SNF; CSW arranging. No other CM needs were communicated.  Mariane Masters, BSN, CM [phone].

## 2014-10-08 ENCOUNTER — Ambulatory Visit: Payer: Self-pay | Admitting: Radiation Oncology

## 2014-10-08 LAB — TYPE AND SCREEN
ABO/RH(D): O POS
ANTIBODY SCREEN: NEGATIVE
Unit division: 0
Unit division: 0

## 2014-10-08 LAB — BASIC METABOLIC PANEL
ANION GAP: 9 (ref 5–15)
BUN: 23 mg/dL (ref 6–23)
CO2: 22 mmol/L (ref 19–32)
CREATININE: 0.99 mg/dL (ref 0.50–1.10)
Calcium: 8.5 mg/dL (ref 8.4–10.5)
Chloride: 102 mmol/L (ref 96–112)
GFR calc Af Amer: 59 mL/min — ABNORMAL LOW (ref >90)
GFR calc non Af Amer: 51 mL/min — ABNORMAL LOW (ref >90)
Glucose, Bld: 243 mg/dL — ABNORMAL HIGH (ref 70–99)
Potassium: 4.2 mmol/L (ref 3.5–5.1)
SODIUM: 133 mmol/L — AB (ref 135–145)

## 2014-10-08 LAB — CBC
HCT: 33.5 % — ABNORMAL LOW (ref 36.0–46.0)
Hemoglobin: 10.3 g/dL — ABNORMAL LOW (ref 12.0–15.0)
MCH: 26.3 pg (ref 26.0–34.0)
MCHC: 30.7 g/dL (ref 30.0–36.0)
MCV: 85.5 fL (ref 78.0–100.0)
PLATELETS: 196 10*3/uL (ref 150–400)
RBC: 3.92 MIL/uL (ref 3.87–5.11)
RDW: 14.9 % (ref 11.5–15.5)
WBC: 7.6 10*3/uL (ref 4.0–10.5)

## 2014-10-08 LAB — GLUCOSE, CAPILLARY
GLUCOSE-CAPILLARY: 151 mg/dL — AB (ref 70–99)
Glucose-Capillary: 221 mg/dL — ABNORMAL HIGH (ref 70–99)
Glucose-Capillary: 337 mg/dL — ABNORMAL HIGH (ref 70–99)
Glucose-Capillary: 147 mg/dL — ABNORMAL HIGH (ref 70–99)

## 2014-10-08 NOTE — Progress Notes (Signed)
   Subjective: 2 Days Post-Op Procedure(s) (LRB): RIGHT TOTAL KNEE ARTHROPLASTY (Right) Patient reports pain as mild.   Plan is to go Skilled nursing facility after hospital stay.  Objective: Vital signs in last 24 hours: Temp:  [98.3 F (36.8 C)-99.9 F (37.7 C)] 98.4 F (36.9 C) (01/27 0620) Pulse Rate:  [56-101] 95 (01/27 0620) Resp:  [18-22] 20 (01/27 0620) BP: (96-156)/(36-95) 114/95 mmHg (01/27 0620) SpO2:  [92 %-100 %] 93 % (01/27 0620)  Intake/Output from previous day:  Intake/Output Summary (Last 24 hours) at 10/08/14 0924 Last data filed at 10/08/14 9458  Gross per 24 hour  Intake 1025.67 ml  Output   1200 ml  Net -174.33 ml    Intake/Output this shift: Total I/O In: 240 [P.O.:240] Out: -   Labs:  Recent Labs  10/07/14 0530 10/08/14 0520  HGB 7.0* 10.3*    Recent Labs  10/07/14 0530 10/08/14 0520  WBC 4.9 7.6  RBC 2.69* 3.92  HCT 23.5* 33.5*  PLT 219 196    Recent Labs  10/07/14 0530 10/08/14 0520  NA 137 133*  K 4.1 4.2  CL 105 102  CO2 26 22  BUN 20 23  CREATININE 0.90 0.99  GLUCOSE 177* 243*  CALCIUM 8.3* 8.5   No results for input(s): LABPT, INR in the last 72 hours.  EXAM General - Patient is Alert, Appropriate and Oriented Extremity - Neurologically intact Neurovascular intact Incision: dressing C/D/I No cellulitis present Compartment soft Dressing/Incision - clean, dry, no drainage Motor Function - intact, moving foot and toes well on exam.   Past Medical History  Diagnosis Date  . Hypercholesterolemia   . Hypertension   . Diverticulitis   . Gastric ulcer   . Arthritis   . Anemia   . Back injury 1998    T-12 fracture (brace)  . Nodule of left lung   . GERD (gastroesophageal reflux disease)   . History of radiation therapy 12/31/2012-01/10/2013    62 gray to left lower lung  . Peripheral vascular disease 1957/1997    DVT bilateral following fall  . Diabetes mellitus without complication     type 2  . Hx of  cancer of lung 2014    tx with radiation only  . Numbness     left lower leg since left total knee nerve damage  . History of kidney stones     x1 attack many yrs ago  . Difficulty sleeping   . Cancer 2014    adenocarcinoma/  lung    Assessment/Plan: 2 Days Post-Op Procedure(s) (LRB): RIGHT TOTAL KNEE ARTHROPLASTY (Right) Active Problems:   OA (osteoarthritis) of knee   Up with therapy Discharge to SNF tomorrow  DVT Prophylaxis - Xarelto Weight-Bearing as tolerated to right leg  Sanjay Broadfoot V 10/08/2014, 9:24 AM

## 2014-10-08 NOTE — Progress Notes (Signed)
   Subjective: 2 Days Post-Op Procedure(s) (LRB): RIGHT TOTAL KNEE ARTHROPLASTY (Right) Patient reports pain as mild and moderate.   Patient seen in rounds by Dr. Wynelle Link. Patient is well, but has had some minor complaints of pain in the knee, requiring pain medications Plan is to go to [facility] after hospital stay.  HGB is better today following transfusion - 10.3.  Objective: Vital signs in last 24 hours: Temp:  [98.4 F (36.9 C)-99.9 F (37.7 C)] 98.4 F (36.9 C) (01/27 0620) Pulse Rate:  [81-101] 95 (01/27 0620) Resp:  [18-20] 20 (01/27 0620) BP: (103-156)/(44-95) 114/95 mmHg (01/27 0620) SpO2:  [92 %-98 %] 93 % (01/27 0620)  Intake/Output from previous day:  Intake/Output Summary (Last 24 hours) at 10/08/14 1136 Last data filed at 10/08/14 1914  Gross per 24 hour  Intake 1025.67 ml  Output   1100 ml  Net -74.33 ml    Intake/Output this shift: Total I/O In: 240 [P.O.:240] Out: -   Labs:  Recent Labs  10/07/14 0530 10/08/14 0520  HGB 7.0* 10.3*    Recent Labs  10/07/14 0530 10/08/14 0520  WBC 4.9 7.6  RBC 2.69* 3.92  HCT 23.5* 33.5*  PLT 219 196    Recent Labs  10/07/14 0530 10/08/14 0520  NA 137 133*  K 4.1 4.2  CL 105 102  CO2 26 22  BUN 20 23  CREATININE 0.90 0.99  GLUCOSE 177* 243*  CALCIUM 8.3* 8.5   No results for input(s): LABPT, INR in the last 72 hours.  EXAM General - Patient is Alert, Appropriate and Oriented Extremity - Neurovascular intact Sensation intact distally Dressing/Incision - clean, dry, no drainage Motor Function - intact, moving foot and toes well on exam.   Past Medical History  Diagnosis Date  . Hypercholesterolemia   . Hypertension   . Diverticulitis   . Gastric ulcer   . Arthritis   . Anemia   . Back injury 1998    T-12 fracture (brace)  . Nodule of left lung   . GERD (gastroesophageal reflux disease)   . History of radiation therapy 12/31/2012-01/10/2013    62 gray to left lower lung  .  Peripheral vascular disease 1957/1997    DVT bilateral following fall  . Diabetes mellitus without complication     type 2  . Hx of cancer of lung 2014    tx with radiation only  . Numbness     left lower leg since left total knee nerve damage  . History of kidney stones     x1 attack many yrs ago  . Difficulty sleeping   . Cancer 2014    adenocarcinoma/  lung    Assessment/Plan: 2 Days Post-Op Procedure(s) (LRB): RIGHT TOTAL KNEE ARTHROPLASTY (Right) Active Problems:   OA (osteoarthritis) of knee  Estimated body mass index is 23.98 kg/(m^2) as calculated from the following:   Height as of this encounter: 5\' 6"  (1.676 m).   Weight as of this encounter: 67.359 kg (148 lb 8 oz). Up with therapy Plan for discharge tomorrow Discharge to SNF - [facility]  DVT Prophylaxis - Xarelto Weight-Bearing as tolerated to right leg  Arlee Muslim, PA-C Orthopaedic Surgery 10/08/2014, 11:36 AM

## 2014-10-08 NOTE — Evaluation (Signed)
Occupational Therapy Evaluation Patient Details Name: Stephanie Fletcher MRN: [id number] DOB: 23-Aug-1929 Today's Date: 10/08/2014    History of Present Illness 81 yo female s/p R TKA 10/06/14. Hx of L TKA 2014, L peroneal nerve decompression 2014, DM, neuropathy. Pt is from [place] [facility]   Pt was admitted for the above surgery. She will benefit from skilled OT to increase safety and independence with ADLs. Pt was mod I prior to admission.   Goals in acute care are for min A.  Pt currently needs overall mod to max A for transfers and LB adls.    Follow Up Recommendations  SNF    Equipment Recommendations  3 in 1 bedside comode    Recommendations for Other Services       Precautions / Restrictions Precautions Precautions: Fall Required Braces or Orthoses: Knee Immobilizer - Right Knee Immobilizer - Right: Discontinue once straight leg raise with < 10 degree lag Restrictions Weight Bearing Restrictions: No RLE Weight Bearing: Weight bearing as tolerated      Mobility Bed Mobility         Supine to sit: Min assist     General bed mobility comments: supported RLE  Transfers   Equipment used: Rolling walker (2 wheeled) Transfers: Sit to/from Stand Sit to Stand: Mod assist;From elevated surface         General transfer comment: assist to power up and stabilize.  VCS for UE/LE placement    Balance                                            ADL Overall ADL's : Needs assistance/impaired     Grooming: Oral care;Set up;Sitting   Upper Body Bathing: Set up;Sitting   Lower Body Bathing: Moderate assistance;Sit to/from stand   Upper Body Dressing : Set up;Sitting   Lower Body Dressing: Maximal assistance;Sit to/from stand   Toilet Transfer: Moderate assistance;Stand-pivot (to chair:  see comments below)   Toileting- Clothing Manipulation and Hygiene: Moderate assistance;Sit to/from stand         General ADL  Comments: pt was finishing bathing when OT arrived.  Assisted with SPT to chair: pt cannot unweight strong leg enough to step; unweighted heel then toe and laterally scooted to chair.  Brought chair up close and pt was unable to step backwards to it     Vision                     Perception     Praxis      Pertinent Vitals/Pain Pain Assessment: 0-10 Pain Score: 4  Pain Location: R knee Pain Descriptors / Indicators: Aching Pain Intervention(s): Limited activity within patient's tolerance;Monitored during session;Premedicated before session;Repositioned;Ice applied     Hand Dominance     Extremity/Trunk Assessment Upper Extremity Assessment Upper Extremity Assessment: Overall WFL for tasks assessed           Communication Communication Communication: No difficulties   Cognition Arousal/Alertness: Awake/alert Behavior During Therapy: WFL for tasks assessed/performed Overall Cognitive Status: Within Functional Limits for tasks assessed                     General Comments       Exercises       Shoulder Instructions      Home Living Family/patient expects to be discharged to::  Skilled nursing facility (at [facility])                                        Prior Functioning/Environment Level of Independence: Independent with assistive device(s)             OT Diagnosis: Generalized weakness   OT Problem List: Decreased strength;Decreased activity tolerance;Decreased knowledge of use of DME or AE;Pain   OT Treatment/Interventions: Self-care/ADL training;DME and/or AE instruction;Patient/family education    OT Goals(Current goals can be found in the care plan section) Acute Rehab OT Goals Patient Stated Goal: to go to rehab to regain independence OT Goal Formulation: With patient Time For Goal Achievement: 10/15/14 Potential to Achieve Goals: Good ADL Goals Pt Will Perform Lower Body Bathing: with min assist;with adaptive  equipment;sit to/from stand Pt Will Perform Lower Body Dressing: with min assist;with adaptive equipment;sit to/from stand Pt Will Transfer to Toilet: with min assist;stand pivot transfer;bedside commode Pt Will Perform Toileting - Clothing Manipulation and hygiene: with min assist;sit to/from stand  OT Frequency: Min 2X/week   Barriers to D/C:            Co-evaluation              End of Session CPM Right Knee CPM Right Knee: Off  Activity Tolerance: Patient tolerated treatment well Patient left: in chair;with call bell/phone within reach   Time: 0908-0923 OT Time Calculation (min): 15 min Charges:  OT General Charges $OT Visit: 1 Procedure OT Evaluation $Initial OT Evaluation Tier I: 1 Procedure G-Codes:    Gwendolyn Nishi Oct 22, 2014, 9:33 AM  Lesle Chris, OTR/L [phone] Oct 22, 2014

## 2014-10-08 NOTE — Progress Notes (Signed)
Physical Therapy Treatment Patient Details Name: Stephanie Fletcher MRN: [id number] DOB: November 10, 1928 Today's Date: 10/08/2014    History of Present Illness 81 yo female s/p R TKA 10/06/14. Hx of L TKA 2014, L peroneal nerve decompression 2014, DM, neuropathy. Pt is from [place] with mobility. Limited by pain, fatigue. Recommend SNF.  Follow Up Recommendations  SNF;Supervision/Assistance - 24 hour     Equipment Recommendations  None recommended by PT    Recommendations for Other Services OT consult     Precautions / Restrictions Precautions Precautions: Fall Required Braces or Orthoses: Knee Immobilizer - Right Knee Immobilizer - Right: Discontinue once straight leg raise with < 10 degree lag Restrictions Weight Bearing Restrictions: No RLE Weight Bearing: Weight bearing as tolerated    Mobility  Bed Mobility               General bed mobility comments: oob in recliner  Transfers Overall transfer level: Needs assistance Equipment used: Rolling walker (2 wheeled) Transfers: Sit to/from Stand Sit to Stand: Mod assist         General transfer comment: assist to power up and stabilize.  VCS for UE/LE placement  Ambulation/Gait Ambulation/Gait assistance: Min assist Ambulation Distance (Feet): 25 Feet Assistive device: Rolling walker (2 wheeled) Gait Pattern/deviations: Trunk flexed;Step-to pattern;Decreased dorsiflexion - right;Decreased dorsiflexion - left;Decreased stance time - right;Antalgic     General Gait Details: Multimodal cues for safety, posture, step length, distance from walker. Assist to stabilize pt. distance limited by pain, fatigue. Recliner brought up behind pt to sit.    Stairs            Wheelchair Mobility    Modified Rankin (Stroke Patients Only)       Balance                                    Cognition Arousal/Alertness: Awake/alert Behavior During Therapy: WFL  for tasks assessed/performed Overall Cognitive Status: Within Functional Limits for tasks assessed                      Exercises Total Joint Exercises Ankle Circles/Pumps: AROM;Both;10 reps;Supine Quad Sets: AROM;Both;10 reps;Supine Heel Slides: AAROM;Right;10 reps;Supine Hip ABduction/ADduction: AAROM;Right;10 reps;Supine Straight Leg Raises: AAROM;Right;10 reps;Supine Goniometric ROM: 10-45 degrees    General Comments        Pertinent Vitals/Pain Pain Assessment: Faces Faces Pain Scale: Hurts even more Pain Location: R knee Pain Descriptors / Indicators: Aching Pain Intervention(s): Monitored during session;Ice applied;Repositioned    Home Living                      Prior Function            PT Goals (current goals can now be found in the care plan section) Progress towards PT goals: Progressing toward goals    Frequency  7X/week    PT Plan Current plan remains appropriate    Co-evaluation             End of Session Equipment Utilized During Treatment: Gait belt;Right knee immobilizer Activity Tolerance: Patient limited by fatigue;Patient limited by pain Patient left: in chair;with call bell/phone within reach     Time: 1040-1058 PT Time Calculation (min) (ACUTE ONLY): 18 min  Charges:  $Gait Training: 8-22 mins  G Codes:      Weston Anna, MPT Pager: [phone]

## 2014-10-09 LAB — CBC
HCT: 32.6 % — ABNORMAL LOW (ref 36.0–46.0)
Hemoglobin: 10 g/dL — ABNORMAL LOW (ref 12.0–15.0)
MCH: 26.4 pg (ref 26.0–34.0)
MCHC: 30.7 g/dL (ref 30.0–36.0)
MCV: 86 fL (ref 78.0–100.0)
Platelets: 195 10*3/uL (ref 150–400)
RBC: 3.79 MIL/uL — ABNORMAL LOW (ref 3.87–5.11)
RDW: 14.9 % (ref 11.5–15.5)
WBC: 6.6 10*3/uL (ref 4.0–10.5)

## 2014-10-09 LAB — GLUCOSE, CAPILLARY
GLUCOSE-CAPILLARY: 166 mg/dL — AB (ref 70–99)
GLUCOSE-CAPILLARY: 321 mg/dL — AB (ref 70–99)

## 2014-10-09 MED ORDER — METOCLOPRAMIDE HCL 5 MG PO TABS: 5.0000 mg | ORAL_TABLET | Freq: Three times a day (TID) | ORAL | Status: DC | PRN

## 2014-10-09 MED ORDER — TRAMADOL HCL 50 MG PO TABS: 50.0000 mg | ORAL_TABLET | Freq: Four times a day (QID) | ORAL | Status: DC | PRN

## 2014-10-09 MED ORDER — METHOCARBAMOL 500 MG PO TABS: 500.0000 mg | ORAL_TABLET | Freq: Four times a day (QID) | ORAL | Status: DC | PRN

## 2014-10-09 MED ORDER — DOCUSATE SODIUM 100 MG PO CAPS: 100.0000 mg | ORAL_CAPSULE | Freq: Two times a day (BID) | ORAL | Status: AC

## 2014-10-09 MED ORDER — BISACODYL 10 MG RE SUPP: 10.0000 mg | Freq: Every day | RECTAL | Status: AC | PRN

## 2014-10-09 MED ORDER — OXYCODONE HCL 5 MG PO TABS: 5.0000 mg | ORAL_TABLET | ORAL | Status: DC | PRN

## 2014-10-09 MED ORDER — ONDANSETRON HCL 4 MG PO TABS: 4.0000 mg | ORAL_TABLET | Freq: Four times a day (QID) | ORAL | Status: AC | PRN

## 2014-10-09 MED ORDER — RIVAROXABAN 10 MG PO TABS: 10.0000 mg | ORAL_TABLET | Freq: Every day | ORAL | Status: DC

## 2014-10-09 NOTE — Progress Notes (Signed)
ST Rehab bed is available at [facility] if pt is ready for d/c today. CSW will assist with d/c planning to SNF.  Werner Lean LCSW [phone]

## 2014-10-09 NOTE — Discharge Summary (Signed)
Physician Discharge Summary   Patient ID: Stephanie Fletcher MRN: [id number] DOB/AGE: 1928-09-16 81 y.o.  Admit date: 10/06/2014 Discharge date: 10-09-2014  Primary Diagnosis:  Osteoarthritis Right knee(s)  Admission Diagnoses:  Past Medical History  Diagnosis Date  . Hypercholesterolemia   . Hypertension   . Diverticulitis   . Gastric ulcer   . Arthritis   . Anemia   . Back injury 1998    T-12 fracture (brace)  . Nodule of left lung   . GERD (gastroesophageal reflux disease)   . History of radiation therapy 12/31/2012-01/10/2013    60 gray to left lower lung  . Peripheral vascular disease 1957/1997    DVT bilateral following fall  . Diabetes mellitus without complication     type 2  . Hx of cancer of lung 2014    tx with radiation only  . Numbness     left lower leg since left total knee nerve damage  . History of kidney stones     x1 attack many yrs ago  . Difficulty sleeping   . Cancer 2014    adenocarcinoma/  lung   Discharge Diagnoses:   Active Problems:   OA (osteoarthritis) of knee  Estimated body mass index is 23.98 kg/(m^2) as calculated from the following:   Height as of this encounter: '5\' 6"'  (1.676 m).   Weight as of this encounter: 67.359 kg (148 lb 8 oz).  Procedure:  Procedure(s) (LRB): RIGHT TOTAL KNEE ARTHROPLASTY (Right)   Consults: None  HPI: Stephanie Fletcher is a 89 y.o. year old female with end stage OA of her right knee with progressively worsening pain and dysfunction. She has constant pain, with activity and at rest and significant functional deficits with difficulties even with ADLs. She has had extensive non-op management including analgesics, injections of cortisone and viscosupplements, and home exercise program, but remains in significant pain with significant dysfunction.Radiographs show bone on bone arthritis lateral and patellofemoral with valgus deformity. She presents now for right Total Knee Arthroplasty.   Laboratory  Data: Admission on 10/06/2014  Component Date Value Ref Range Status  . Glucose-Capillary 10/06/2014 140* 70 - 99 mg/dL Final  . Comment 1 10/06/2014 Notify RN   Final  . Glucose-Capillary 10/06/2014 107* 70 - 99 mg/dL Final  . Glucose-Capillary 10/06/2014 178* 70 - 99 mg/dL Final  . Glucose-Capillary 10/06/2014 195* 70 - 99 mg/dL Final  . WBC 10/07/2014 4.9  4.0 - 10.5 K/uL Final  . RBC 10/07/2014 2.69* 3.87 - 5.11 MIL/uL Final  . Hemoglobin 10/07/2014 7.0* 12.0 - 15.0 g/dL Final  . HCT 10/07/2014 23.5* 36.0 - 46.0 % Final  . MCV 10/07/2014 87.4  78.0 - 100.0 fL Final  . MCH 10/07/2014 26.0  26.0 - 34.0 pg Final  . MCHC 10/07/2014 29.8* 30.0 - 36.0 g/dL Final  . RDW 10/07/2014 15.0  11.5 - 15.5 % Final  . Platelets 10/07/2014 219  150 - 400 K/uL Final  . Sodium 10/07/2014 137  135 - 145 mmol/L Final  . Potassium 10/07/2014 4.1  3.5 - 5.1 mmol/L Final  . Chloride 10/07/2014 105  96 - 112 mmol/L Final  . CO2 10/07/2014 26  19 - 32 mmol/L Final  . Glucose, Bld 10/07/2014 177* 70 - 99 mg/dL Final  . BUN 10/07/2014 20  6 - 23 mg/dL Final  . Creatinine, Ser 10/07/2014 0.90  0.50 - 1.10 mg/dL Final  . Calcium 10/07/2014 8.3* 8.4 - 10.5 mg/dL Final  . GFR calc non Af Wyvonnia Lora  10/07/2014 58* >90 mL/min Final  . GFR calc Af Amer 10/07/2014 67* >90 mL/min Final   Comment: (NOTE) The eGFR has been calculated using the CKD EPI equation. This calculation has not been validated in all clinical situations. eGFR's persistently <90 mL/min signify possible Chronic Kidney Disease.   . Anion gap 10/07/2014 6  5 - 15 Final  . Glucose-Capillary 10/06/2014 194* 70 - 99 mg/dL Final  . Comment 1 10/06/2014 Documented in Chart   Final  . Comment 2 10/06/2014 Notify RN   Final  . Glucose-Capillary 10/07/2014 145* 70 - 99 mg/dL Final  . Order Confirmation 10/07/2014 ORDER PROCESSED BY BLOOD BANK   Final  . Glucose-Capillary 10/07/2014 113* 70 - 99 mg/dL Final  . WBC 10/08/2014 7.6  4.0 - 10.5 K/uL Final  .  RBC 10/08/2014 3.92  3.87 - 5.11 MIL/uL Final  . Hemoglobin 10/08/2014 10.3* 12.0 - 15.0 g/dL Final   Comment: DELTA CHECK NOTED POST TRANSFUSION SPECIMEN   . HCT 10/08/2014 33.5* 36.0 - 46.0 % Final  . MCV 10/08/2014 85.5  78.0 - 100.0 fL Final  . MCH 10/08/2014 26.3  26.0 - 34.0 pg Final  . MCHC 10/08/2014 30.7  30.0 - 36.0 g/dL Final  . RDW 10/08/2014 14.9  11.5 - 15.5 % Final  . Platelets 10/08/2014 196  150 - 400 K/uL Final  . Sodium 10/08/2014 133* 135 - 145 mmol/L Final  . Potassium 10/08/2014 4.2  3.5 - 5.1 mmol/L Final  . Chloride 10/08/2014 102  96 - 112 mmol/L Final  . CO2 10/08/2014 22  19 - 32 mmol/L Final  . Glucose, Bld 10/08/2014 243* 70 - 99 mg/dL Final  . BUN 10/08/2014 23  6 - 23 mg/dL Final  . Creatinine, Ser 10/08/2014 0.99  0.50 - 1.10 mg/dL Final  . Calcium 10/08/2014 8.5  8.4 - 10.5 mg/dL Final  . GFR calc non Af Amer 10/08/2014 51* >90 mL/min Final  . GFR calc Af Amer 10/08/2014 59* >90 mL/min Final   Comment: (NOTE) The eGFR has been calculated using the CKD EPI equation. This calculation has not been validated in all clinical situations. eGFR's persistently <90 mL/min signify possible Chronic Kidney Disease.   . Anion gap 10/08/2014 9  5 - 15 Final  . Glucose-Capillary 10/07/2014 272* 70 - 99 mg/dL Final  . Glucose-Capillary 10/07/2014 106* 70 - 99 mg/dL Final  . Glucose-Capillary 10/08/2014 221* 70 - 99 mg/dL Final  . Glucose-Capillary 10/08/2014 337* 70 - 99 mg/dL Final  . Glucose-Capillary 10/08/2014 151* 70 - 99 mg/dL Final  . WBC 10/09/2014 6.6  4.0 - 10.5 K/uL Final  . RBC 10/09/2014 3.79* 3.87 - 5.11 MIL/uL Final  . Hemoglobin 10/09/2014 10.0* 12.0 - 15.0 g/dL Final  . HCT 10/09/2014 32.6* 36.0 - 46.0 % Final  . MCV 10/09/2014 86.0  78.0 - 100.0 fL Final  . MCH 10/09/2014 26.4  26.0 - 34.0 pg Final  . MCHC 10/09/2014 30.7  30.0 - 36.0 g/dL Final  . RDW 10/09/2014 14.9  11.5 - 15.5 % Final  . Platelets 10/09/2014 195  150 - 400 K/uL Final  .  Glucose-Capillary 10/08/2014 147* 70 - 99 mg/dL Final  . Glucose-Capillary 10/09/2014 166* 70 - 99 mg/dL Final  Hospital Outpatient Visit on 09/29/2014  Component Date Value Ref Range Status  . MRSA, PCR 09/29/2014 NEGATIVE  NEGATIVE Final  . Staphylococcus aureus 09/29/2014 NEGATIVE  NEGATIVE Final   Comment:        The Xpert SA  Assay (FDA approved for NASAL specimens in patients over 40 years of age), is one component of a comprehensive surveillance program.  Test performance has been validated by [facility] for patients greater than or equal to 58 year old. It is not intended to diagnose infection nor to guide or monitor treatment.   [name] aPTT 09/29/2014 31  24 - 37 seconds Final  . WBC 09/29/2014 4.6  4.0 - 10.5 K/uL Final  . RBC 09/29/2014 3.37* 3.87 - 5.11 MIL/uL Final  . Hemoglobin 09/29/2014 8.6* 12.0 - 15.0 g/dL Final  . HCT 09/29/2014 29.4* 36.0 - 46.0 % Final  . MCV 09/29/2014 87.2  78.0 - 100.0 fL Final  . MCH 09/29/2014 25.5* 26.0 - 34.0 pg Final  . MCHC 09/29/2014 29.3* 30.0 - 36.0 g/dL Final  . RDW 09/29/2014 15.0  11.5 - 15.5 % Final  . Platelets 09/29/2014 248  150 - 400 K/uL Final  . Sodium 09/29/2014 134* 135 - 145 mmol/L Final   Please note change in reference range.  . Potassium 09/29/2014 4.6  3.5 - 5.1 mmol/L Final   Please note change in reference range.  . Chloride 09/29/2014 104  96 - 112 mEq/L Final  . CO2 09/29/2014 22  19 - 32 mmol/L Final  . Glucose, Bld 09/29/2014 217* 70 - 99 mg/dL Final  . BUN 09/29/2014 27* 6 - 23 mg/dL Final  . Creatinine, Ser 09/29/2014 0.90  0.50 - 1.10 mg/dL Final  . Calcium 09/29/2014 8.6  8.4 - 10.5 mg/dL Final  . Total Protein 09/29/2014 6.2  6.0 - 8.3 g/dL Final  . Albumin 09/29/2014 3.7  3.5 - 5.2 g/dL Final  . AST 09/29/2014 18  0 - 37 U/L Final  . ALT 09/29/2014 11  0 - 35 U/L Final  . Alkaline Phosphatase 09/29/2014 51  39 - 117 U/L Final  . Total Bilirubin 09/29/2014 0.3  0.3 - 1.2 mg/dL Final  . GFR calc non  Af Amer 09/29/2014 58* >90 mL/min Final  . GFR calc Af Amer 09/29/2014 67* >90 mL/min Final   Comment: (NOTE) The eGFR has been calculated using the CKD EPI equation. This calculation has not been validated in all clinical situations. eGFR's persistently <90 mL/min signify possible Chronic Kidney Disease.   . Anion gap 09/29/2014 8  5 - 15 Final  . Prothrombin Time 09/29/2014 13.5  11.6 - 15.2 seconds Final  . INR 09/29/2014 1.02  0.00 - 1.49 Final  . Color, Urine 09/29/2014 YELLOW  YELLOW Final  . APPearance 09/29/2014 CLEAR  CLEAR Final  . Specific Gravity, Urine 09/29/2014 1.019  1.005 - 1.030 Final  . pH 09/29/2014 5.0  5.0 - 8.0 Final  . Glucose, UA 09/29/2014 NEGATIVE  NEGATIVE mg/dL Final  . Hgb urine dipstick 09/29/2014 NEGATIVE  NEGATIVE Final  . Bilirubin Urine 09/29/2014 NEGATIVE  NEGATIVE Final  . Ketones, ur 09/29/2014 NEGATIVE  NEGATIVE mg/dL Final  . Protein, ur 09/29/2014 NEGATIVE  NEGATIVE mg/dL Final  . Urobilinogen, UA 09/29/2014 0.2  0.0 - 1.0 mg/dL Final  . Nitrite 09/29/2014 NEGATIVE  NEGATIVE Final  . Leukocytes, UA 09/29/2014 NEGATIVE  NEGATIVE Final   MICROSCOPIC NOT DONE ON URINES WITH NEGATIVE PROTEIN, BLOOD, LEUKOCYTES, NITRITE, OR GLUCOSE <1000 mg/dL.  . ABO/RH(D) 09/29/2014 O POS   Final  . Antibody Screen 09/29/2014 NEG   Final  . Sample Expiration 09/29/2014 10/09/2014   Final  . Unit Number 09/29/2014 [id number]   Final  . Blood Component Type 09/29/2014 RED CELLS,LR  Final  . Unit division 09/29/2014 00   Final  . Status of Unit 09/29/2014 ISSUED,FINAL   Final  . Transfusion Status 09/29/2014 OK TO TRANSFUSE   Final  . Crossmatch Result 09/29/2014 Compatible   Final  . Unit Number 09/29/2014 [id number]   Final  . Blood Component Type 09/29/2014 RED CELLS,LR   Final  . Unit division 09/29/2014 00   Final  . Status of Unit 09/29/2014 ISSUED,FINAL   Final  . Transfusion Status 09/29/2014 OK TO TRANSFUSE   Final  . Crossmatch Result  09/29/2014 Compatible   Final     X-Rays:No results found.  EKG: Orders placed or performed during the hospital encounter of 07/15/13  . EKG 12-Lead  . EKG 12-Lead     Hospital Course: Stephanie Fletcher is a 27 y.o. who was admitted to [facility]. They were brought to the operating room on 10/06/2014 and underwent Procedure(s): RIGHT TOTAL KNEE ARTHROPLASTY.  Patient tolerated the procedure well and was later transferred to the recovery room and then to the orthopaedic floor for postoperative care.  They were given PO and IV analgesics for pain control following their surgery.  They were given 24 hours of postoperative antibiotics of  Anti-infectives    Start     Dose/Rate Route Frequency Ordered Stop   10/06/14 1400  ceFAZolin (ANCEF) IVPB 2 g/50 mL premix     2 g100 mL/hr over 30 Minutes Intravenous Every 6 hours 10/06/14 1014 10/06/14 2031   10/06/14 0536  ceFAZolin (ANCEF) IVPB 2 g/50 mL premix     2 g100 mL/hr over 30 Minutes Intravenous On call to O.R. 10/06/14 0536 10/06/14 0700     and started on DVT prophylaxis in the form of Xarelto.   PT and OT were ordered for total joint protocol.  Discharge planning consulted to help with postop disposition and equipment needs.  Patient had a tough night on the evening of surgery due to moderate pain that night of surgery.  HGB was down to 7.0 (preop started anemic at 8.6).  Given two units of blood on day one. They started to get up OOB with therapy on day one. Hemovac drain was pulled without difficulty.  Continued to work with therapy into day two. HGB was better on day two  following transfusion - 10.3. Dressing was changed on day two and the incision was healing well.  By day three, the patient had progressed with therapy and meeting their goals.  Incision was healing well.  Patient was seen in rounds and was ready to go to [facility] facility.  Discharge to SNF - [facility] Diet - Cardiac diet and Diabetic diet Follow up - in 2  weeks Activity - WBAT Disposition - Home Condition Upon Discharge - Good D/C Meds - See DC Summary DVT Prophylaxis - Xarelto      Discharge Instructions    Call MD / Call 911    Complete by:  As directed   If you experience chest pain or shortness of breath, CALL 911 and be transported to the hospital emergency room.  If you develope a fever above 101 F, pus (white drainage) or increased drainage or redness at the wound, or calf pain, call your surgeon's office.     Change dressing    Complete by:  As directed   Change dressing daily with sterile 4 x 4 inch gauze dressing and apply TED hose. Do not submerge the incision under water.  Constipation Prevention    Complete by:  As directed   Drink plenty of fluids.  Prune juice may be helpful.  You may use a stool softener, such as Colace (over the counter) 100 mg twice a day.  Use MiraLax (over the counter) for constipation as needed.     Diet - low sodium heart healthy    Complete by:  As directed      Diet Carb Modified    Complete by:  As directed      Discharge instructions    Complete by:  As directed   Pick up stool softner and laxative for home use following surgery while on pain medications. Do not submerge incision under water. Please use good hand washing techniques while changing dressing each day. May shower starting three days after surgery. Please use a clean towel to pat the incision dry following showers. Continue to use ice for pain and swelling after surgery. Do not use any lotions or creams on the incision until instructed by your surgeon.  Take Xarelto for two and a half more weeks, then discontinue Xarelto. Once the patient has completed the Xarelto, they may resume the 81 mg Aspirin.  Postoperative Constipation Protocol  Constipation - defined medically as fewer than three stools per week and severe constipation as less than one stool per week.  One of the most common issues patients have following surgery  is constipation.  Even if you have a regular bowel pattern at home, your normal regimen is likely to be disrupted due to multiple reasons following surgery.  Combination of anesthesia, postoperative narcotics, change in appetite and fluid intake all can affect your bowels.  In order to avoid complications following surgery, here are some recommendations in order to help you during your recovery period.  Colace (docusate) - Pick up an over-the-counter form of Colace or another stool softener and take twice a day as long as you are requiring postoperative pain medications.  Take with a full glass of water daily.  If you experience loose stools or diarrhea, hold the colace until you stool forms back up.  If your symptoms do not get better within 1 week or if they get worse, check with your doctor.  Dulcolax (bisacodyl) - Pick up over-the-counter and take as directed by the product packaging as needed to assist with the movement of your bowels.  Take with a full glass of water.  Use this product as needed if not relieved by Colace only.   MiraLax (polyethylene glycol) - Pick up over-the-counter to have on hand.  MiraLax is a solution that will increase the amount of water in your bowels to assist with bowel movements.  Take as directed and can mix with a glass of water, juice, soda, coffee, or tea.  Take if you go more than two days without a movement. Do not use MiraLax more than once per day. Call your doctor if you are still constipated or irregular after using this medication for 7 days in a row.  If you continue to have problems with postoperative constipation, please contact the office for further assistance and recommendations.  If you experience "the worst abdominal pain ever" or develop nausea or vomiting, please contact the office immediatly for further recommendations for treatment.  When discharged from the skilled rehab facility, please have the facility set up the patient's [place] prior to being released.   Also provide the patient with their medications at time of release from  the facility to include their pain medication, the muscle relaxants, and their blood thinner medication.  If the patient is still at the rehab facility at time of follow up appointment, please also assist the patient in arranging follow up appointment in our office and any transportation needs. ICE to the affected knee or hip every three hours for 30 minutes at a time and then as needed for pain and swelling.     Do not put a pillow under the knee. Place it under the heel.    Complete by:  As directed      Do not sit on low chairs, stoools or toilet seats, as it may be difficult to get up from low surfaces    Complete by:  As directed      Driving restrictions    Complete by:  As directed   No driving until released by the physician.     Increase activity slowly as tolerated    Complete by:  As directed      Lifting restrictions    Complete by:  As directed   No lifting until released by the physician.     Patient may shower    Complete by:  As directed   You may shower without a dressing once there is no drainage.  Do not wash over the wound.  If drainage remains, do not shower until drainage stops.     TED hose    Complete by:  As directed   Use stockings (TED hose) for 3 weeks on both leg(s).  You may remove them at night for sleeping.     Weight bearing as tolerated    Complete by:  As directed             Medication List    STOP taking these medications        aspirin 81 MG tablet     Calcium Carb-Cholecalciferol 600-200 MG-UNIT Tabs     CYANOCOBALAMIN IJ     FISH OIL PO     multivitamin with minerals Tabs tablet     PRESERVISION AREDS 2 PO      TAKE these medications        acetaminophen 500 MG tablet  Commonly known as:  TYLENOL  Take 1,000 mg by mouth at bedtime as needed for mild pain.     bisacodyl 10 MG suppository  Commonly known as:  DULCOLAX    Place 1 suppository (10 mg total) rectally daily as needed for moderate constipation.     dicyclomine 20 MG tablet  Commonly known as:  BENTYL  Take 20 mg by mouth every 6 (six) hours as needed (for stomach).     DIOVAN HCT 160-25 MG per tablet  Generic drug:  valsartan-hydrochlorothiazide  Take 1 tablet by mouth every morning.     docusate sodium 100 MG capsule  Commonly known as:  COLACE  Take 1 capsule (100 mg total) by mouth 2 (two) times daily.     eszopiclone 1 MG Tabs tablet  Commonly known as:  LUNESTA  Take 1 mg by mouth at bedtime as needed for sleep. Take immediately before bedtime     glimepiride 2 MG tablet  Commonly known as:  AMARYL  Take 2 mg by mouth daily with breakfast.     menthol-cetylpyridinium 3 MG lozenge  Commonly known as:  CEPACOL  Take 1 lozenge by mouth as needed for sore throat.     metFORMIN 500 MG tablet  Commonly known  as:  GLUCOPHAGE  Take 500-1,000 mg by mouth 3 (three) times daily. Take 2 tablets (1000 mg) every morning, 1 tablet (500 mg) at noon, 1 tablet (500 mg) every evening     methocarbamol 500 MG tablet  Commonly known as:  ROBAXIN  Take 1 tablet (500 mg total) by mouth every 6 (six) hours as needed for muscle spasms.     metoCLOPramide 5 MG tablet  Commonly known as:  REGLAN  Take 1 tablet (5 mg total) by mouth every 8 (eight) hours as needed for nausea (if ondansetron (ZOFRAN) ineffective.).     ondansetron 4 MG tablet  Commonly known as:  ZOFRAN  Take 1 tablet (4 mg total) by mouth every 6 (six) hours as needed for nausea.     oxyCODONE 5 MG immediate release tablet  Commonly known as:  Oxy IR/ROXICODONE  Take 1-2 tablets (5-10 mg total) by mouth every 3 (three) hours as needed for moderate pain, severe pain or breakthrough pain.     pantoprazole 40 MG tablet  Commonly known as:  PROTONIX  Take 40 mg by mouth daily.     pioglitazone 15 MG tablet  Commonly known as:  ACTOS  Take 15 mg by mouth daily with breakfast.      polyethylene glycol packet  Commonly known as:  MIRALAX / GLYCOLAX  Take 17 g by mouth daily as needed (constipation).     pravastatin 20 MG tablet  Commonly known as:  PRAVACHOL  Take 20 mg by mouth at bedtime.     rivaroxaban 10 MG Tabs tablet  Commonly known as:  XARELTO  - Take 1 tablet (10 mg total) by mouth daily with breakfast. Take Xarelto for two and a half more weeks, then discontinue Xarelto.  - Once the patient has completed the Xarelto, they may resume the 81 mg Aspirin.     sitaGLIPtin 100 MG tablet  Commonly known as:  JANUVIA  Take 100 mg by mouth daily with breakfast.     traMADol 50 MG tablet  Commonly known as:  ULTRAM  Take 1-2 tablets (50-100 mg total) by mouth every 6 (six) hours as needed (mild pain).       Follow-up Information    Follow up with Gearlean Alf, MD. Schedule an appointment as soon as possible for a visit on 10/21/2014.   Specialty:  Orthopedic Surgery   Why:  Call [phone] tomorrow to make the appointment   Contact information:   [address] [place] [address] [phone]       Signed: Arlee Muslim, PA-C Orthopaedic Surgery 10/09/2014, 8:58 AM

## 2014-10-09 NOTE — Progress Notes (Signed)
Clinical Social Work Department CLINICAL SOCIAL WORK PLACEMENT NOTE 10/09/2014  Patient:  Stephanie Fletcher, Stephanie Fletcher  Account Number:  [id number] Admit date:  10/06/2014  Clinical Social Worker:  Werner Lean, LCSW  Date/time:  10/06/2014 01:54 PM  Clinical Social Work is seeking post-discharge placement for this patient at the following level of care:   SKILLED NURSING   (*CSW will update this form in Epic as items are completed)     Patient/family provided with [facility] Department of Clinical Social Work's list of facilities offering this level of care within the geographic area requested by the patient (or if unable, by the patient's family).  10/06/2014  Patient/family informed of their freedom to choose among providers that offer the needed level of care, that participate in Medicare, Medicaid or managed care program needed by the patient, have an available bed and are willing to accept the patient.    Patient/family informed of [facility]' ownership interest in [facility], as well as of the fact that they are under no obligation to receive care at this facility.  PASARR submitted to EDS on 10/06/2014 PASARR number received on 10/06/2014  FL2 transmitted to all facilities in geographic area requested by pt/family on  10/06/2014 FL2 transmitted to all facilities within larger geographic area on   Patient informed that his/her managed care company has contracts with or will negotiate with  certain facilities, including the following:     Patient/family informed of bed offers received:  10/06/2014 Patient chooses bed at [facility] Physician recommends and patient chooses bed at    Patient to be transferred to [facility] on  10/09/2014 Patient to be transferred to facility by [facility] Patient and family notified of transfer on 10/09/2014 Name of family member notified:  NIECE  The following physician request were entered in Epic:   Additional  Comments: Pt / family are in agreement with d/c to SNF today. [facility] transport required. Pt is aware out of pocket costs may be associated with [facility] transport. NSG reviewed d/c summary, scripts, avs. Scripts included in d/c packet.  Werner Lean LCSW [phone]

## 2014-10-09 NOTE — Progress Notes (Signed)
Physical Therapy Treatment Patient Details Name: Stephanie Fletcher MRN: [id number] DOB: October 15, 1928 Today's Date: 10/09/2014    History of Present Illness 81 yo female s/p R TKA 10/06/14. Hx of L TKA 2014, L peroneal nerve decompression 2014, DM, neuropathy. Pt is from [place] with mobility. Plan is for SNF today.  Follow Up Recommendations  SNF;Supervision/Assistance - 24 hour     Equipment Recommendations  None recommended by PT    Recommendations for Other Services OT consult     Precautions / Restrictions Precautions Precautions: Knee;Fall Required Braces or Orthoses: Knee Immobilizer - Right Knee Immobilizer - Right: Discontinue once straight leg raise with < 10 degree lag Restrictions Weight Bearing Restrictions: No RLE Weight Bearing: Weight bearing as tolerated    Mobility  Bed Mobility               General bed mobility comments: oob in recliner  Transfers Overall transfer level: Needs assistance Equipment used: Rolling walker (2 wheeled) Transfers: Sit to/from Stand Sit to Stand: Min assist         General transfer comment: assist to power up and stabilize.  VCS for UE/LE placement  Ambulation/Gait Ambulation/Gait assistance: Min assist Ambulation Distance (Feet): 45 Feet Assistive device: Rolling walker (2 wheeled) Gait Pattern/deviations: Step-to pattern;Trunk flexed;Decreased dorsiflexion - left;Decreased dorsiflexion - right;Decreased stance time - right     General Gait Details: Multimodal cues for safety, posture, step length, distance from walker. Assist to stabilize pt. distance limited by pain, fatigue. Recliner brought up behind pt to sit.    Stairs            Wheelchair Mobility    Modified Rankin (Stroke Patients Only)       Balance                                    Cognition Arousal/Alertness: Awake/alert Behavior During Therapy: WFL for tasks  assessed/performed Overall Cognitive Status: Within Functional Limits for tasks assessed                      Exercises Total Joint Exercises Ankle Circles/Pumps: AROM;Both;10 reps;Supine Quad Sets: AROM;Both;10 reps;Supine Heel Slides: AAROM;Right;10 reps;Supine Hip ABduction/ADduction: AAROM;Right;10 reps;Supine Straight Leg Raises: AAROM;Right;10 reps;Supine Goniometric ROM: 10-45 degrees    General Comments        Pertinent Vitals/Pain Pain Assessment: Faces Faces Pain Scale: Hurts even more Pain Location: R knee when mobilizing Pain Descriptors / Indicators: Aching;Sore Pain Intervention(s): Monitored during session;Ice applied    Home Living                      Prior Function            PT Goals (current goals can now be found in the care plan section) Progress towards PT goals: Progressing toward goals    Frequency  7X/week    PT Plan Current plan remains appropriate    Co-evaluation             End of Session Equipment Utilized During Treatment: Gait belt;Right knee immobilizer Activity Tolerance: Patient limited by fatigue;Patient limited by pain Patient left: in chair;with call bell/phone within reach     Time: 6945-0388 PT Time Calculation (min) (ACUTE ONLY): 21 min  Charges:  $Gait Training: 8-22 mins  G Codes:      Weston Anna, MPT Pager: [phone]

## 2014-10-09 NOTE — Progress Notes (Signed)
   Subjective: 3 Days Post-Op Procedure(s) (LRB): RIGHT TOTAL KNEE ARTHROPLASTY (Right) Patient reports pain as mild.   Patient seen in rounds by Dr. Wynelle Link. Patient is well, and has had no acute complaints or problems Patient is ready to go to the [facility].  Objective: Vital signs in last 24 hours: Temp:  [97.7 F (36.5 C)-99.1 F (37.3 C)] 98.4 F (36.9 C) (01/28 0525) Pulse Rate:  [78-83] 83 (01/28 0525) Resp:  [18] 18 (01/28 0525) BP: (115-139)/(57-61) 129/57 mmHg (01/28 0525) SpO2:  [93 %-99 %] 93 % (01/28 0525)  Intake/Output from previous day:  Intake/Output Summary (Last 24 hours) at 10/09/14 0835 Last data filed at 10/08/14 1831  Gross per 24 hour  Intake    440 ml  Output    300 ml  Net    140 ml   Labs:  Recent Labs  10/07/14 0530 10/08/14 0520 10/09/14 0519  HGB 7.0* 10.3* 10.0*    Recent Labs  10/08/14 0520 10/09/14 0519  WBC 7.6 6.6  RBC 3.92 3.79*  HCT 33.5* 32.6*  PLT 196 195    Recent Labs  10/07/14 0530 10/08/14 0520  NA 137 133*  K 4.1 4.2  CL 105 102  CO2 26 22  BUN 20 23  CREATININE 0.90 0.99  GLUCOSE 177* 243*  CALCIUM 8.3* 8.5   No results for input(s): LABPT, INR in the last 72 hours.  EXAM: General - Patient is Alert, Appropriate and Oriented Extremity - Neurovascular intact Sensation intact distally Dorsiflexion/Plantar flexion intact Incision - clean, dry, no drainage, healing Motor Function - intact, moving foot and toes well on exam.   Assessment/Plan: 3 Days Post-Op Procedure(s) (LRB): RIGHT TOTAL KNEE ARTHROPLASTY (Right) Procedure(s) (LRB): RIGHT TOTAL KNEE ARTHROPLASTY (Right) Past Medical History  Diagnosis Date  . Hypercholesterolemia   . Hypertension   . Diverticulitis   . Gastric ulcer   . Arthritis   . Anemia   . Back injury 1998    T-12 fracture (brace)  . Nodule of left lung   . GERD (gastroesophageal reflux disease)   . History of radiation therapy 12/31/2012-01/10/2013    62  gray to left lower lung  . Peripheral vascular disease 1957/1997    DVT bilateral following fall  . Diabetes mellitus without complication     type 2  . Hx of cancer of lung 2014    tx with radiation only  . Numbness     left lower leg since left total knee nerve damage  . History of kidney stones     x1 attack many yrs ago  . Difficulty sleeping   . Cancer 2014    adenocarcinoma/  lung   Active Problems:   OA (osteoarthritis) of knee  Estimated body mass index is 23.98 kg/(m^2) as calculated from the following:   Height as of this encounter: 5\' 6"  (1.676 m).   Weight as of this encounter: 67.359 kg (148 lb 8 oz). Up with therapy Discharge to SNF - [facility] Diet - Cardiac diet and Diabetic diet Follow up - in 2  weeks Activity - WBAT Disposition - Home Condition Upon Discharge - Good D/C Meds - See DC Summary DVT Prophylaxis - Xarelto  Arlee Muslim, PA-C Orthopaedic Surgery 10/09/2014, 8:35 AM

## 2014-10-10 DIAGNOSIS — E785 Hyperlipidemia, unspecified: Secondary | ICD-10-CM

## 2014-10-10 DIAGNOSIS — I10 Essential (primary) hypertension: Secondary | ICD-10-CM

## 2014-10-10 DIAGNOSIS — E119 Type 2 diabetes mellitus without complications: Secondary | ICD-10-CM

## 2014-10-10 DIAGNOSIS — K219 Gastro-esophageal reflux disease without esophagitis: Secondary | ICD-10-CM

## 2014-10-10 DIAGNOSIS — M1712 Unilateral primary osteoarthritis, left knee: Secondary | ICD-10-CM

## 2014-10-10 DIAGNOSIS — C3490 Malignant neoplasm of unspecified part of unspecified bronchus or lung: Secondary | ICD-10-CM

## 2014-10-21 DIAGNOSIS — I10 Essential (primary) hypertension: Secondary | ICD-10-CM

## 2014-10-21 DIAGNOSIS — K219 Gastro-esophageal reflux disease without esophagitis: Secondary | ICD-10-CM

## 2014-10-21 DIAGNOSIS — M1711 Unilateral primary osteoarthritis, right knee: Secondary | ICD-10-CM

## 2014-10-27 ENCOUNTER — Telehealth: Payer: Self-pay | Admitting: *Deleted

## 2014-10-27 NOTE — Telephone Encounter (Signed)
Called patient to inform of lab, CT and fu, spoke with patient and she is aware of all these appts.

## 2014-11-14 ENCOUNTER — Encounter (HOSPITAL_COMMUNITY): Payer: Self-pay

## 2014-11-14 ENCOUNTER — Ambulatory Visit
Admission: RE | Admit: 2014-11-14 | Discharge: 2014-11-14 | Disposition: A | Discharge status: Home / Self Care | Payer: Medicare Other | Source: Ambulatory Visit | Attending: Radiation Oncology | Admitting: Radiation Oncology

## 2014-11-14 ENCOUNTER — Ambulatory Visit (HOSPITAL_COMMUNITY)
Admission: RE | Admit: 2014-11-14 | Discharge: 2014-11-14 | Disposition: A | Discharge status: Home / Self Care | Payer: Medicare Other | Source: Ambulatory Visit | Attending: Radiation Oncology | Admitting: Radiation Oncology

## 2014-11-14 DIAGNOSIS — Z85118 Personal history of other malignant neoplasm of bronchus and lung: Secondary | ICD-10-CM | POA: Insufficient documentation

## 2014-11-14 DIAGNOSIS — C3432 Malignant neoplasm of lower lobe, left bronchus or lung: Secondary | ICD-10-CM | POA: Diagnosis present

## 2014-11-14 DIAGNOSIS — Z923 Personal history of irradiation: Secondary | ICD-10-CM | POA: Diagnosis not present

## 2014-11-14 LAB — BUN AND CREATININE (CC13)
BUN: 43 mg/dL — ABNORMAL HIGH (ref 7.0–26.0)
Creatinine: 0.9 mg/dL (ref 0.6–1.1)
EGFR: 58 mL/min/{1.73_m2} — ABNORMAL LOW (ref >90)

## 2014-11-14 MED ORDER — IOHEXOL 300 MG/ML  SOLN
80.0000 mL | Freq: Once | INTRAMUSCULAR | Status: AC | PRN
  Administered 2014-11-14: 80 mL via INTRAVENOUS

## 2014-11-20 ENCOUNTER — Ambulatory Visit
Admission: RE | Admit: 2014-11-20 | Discharge: 2014-11-20 | Disposition: A | Discharge status: Home / Self Care | Payer: Medicare Other | Source: Ambulatory Visit | Attending: Radiation Oncology | Admitting: Radiation Oncology

## 2014-11-20 ENCOUNTER — Encounter: Payer: Self-pay | Admitting: Radiation Oncology

## 2014-11-20 VITALS — BP 162/40 | HR 77 | Temp 97.7°F | Resp 20 | Ht 63.0 in | Wt 142.2 lb

## 2014-11-20 DIAGNOSIS — C3432 Malignant neoplasm of lower lobe, left bronchus or lung: Secondary | ICD-10-CM

## 2014-11-20 NOTE — Progress Notes (Signed)
Radiation Oncology         [phone] ________________________________  Name: Stephanie Fletcher MRN: [id number]  Date: 11/20/2014  DOB: [birth year]  Follow-Up Visit Note  CC: Glendon Axe, MD  Melrose Nakayama, *  Diagnosis:   History of non-small cell lung cancer status post stereotactic body radiation treatment to the left lung  Interval Since Last Radiation:  22 months   Narrative:  The patient returns today for routine follow-up.  She states that she is doing well at this time. She notes no difficulties in terms of shortness of breath. No chest discomfort or pain. The patient had knee replacement surgery since she was last seen. She states that this went very well.                              ALLERGIES:  is allergic to actos and fosamax.  Meds: Current Outpatient Prescriptions  Medication Sig Dispense Refill  . dicyclomine (BENTYL) 20 MG tablet Take 20 mg by mouth every 6 (six) hours as needed (for stomach).    . docusate sodium (COLACE) 100 MG capsule Take 1 capsule (100 mg total) by mouth 2 (two) times daily. 10 capsule 0  . glimepiride (AMARYL) 2 MG tablet Take 2 mg by mouth daily with breakfast.    . menthol-cetylpyridinium (CEPACOL) 3 MG lozenge Take 1 lozenge by mouth as needed for sore throat.    . metFORMIN (GLUCOPHAGE) 500 MG tablet Take 500-1,000 mg by mouth 3 (three) times daily. Take 2 tablets (1000 mg) every morning, 1 tablet (500 mg) at noon, 1 tablet (500 mg) every evening    . methocarbamol (ROBAXIN) 500 MG tablet Take 1 tablet (500 mg total) by mouth every 6 (six) hours as needed for muscle spasms. 80 tablet 0  . metoCLOPramide (REGLAN) 5 MG tablet Take 1 tablet (5 mg total) by mouth every 8 (eight) hours as needed for nausea (if ondansetron (ZOFRAN) ineffective.). 40 tablet 0  . pantoprazole (PROTONIX) 40 MG tablet Take 40 mg by mouth daily.    . pioglitazone (ACTOS) 15 MG tablet Take 15 mg by mouth daily with breakfast.     . polyethylene glycol (MIRALAX  / GLYCOLAX) packet Take 17 g by mouth daily as needed (constipation).    . pravastatin (PRAVACHOL) 20 MG tablet Take 20 mg by mouth at bedtime.    . sitaGLIPtin (JANUVIA) 100 MG tablet Take 100 mg by mouth daily with breakfast.     . traMADol (ULTRAM) 50 MG tablet Take 1-2 tablets (50-100 mg total) by mouth every 6 (six) hours as needed (mild pain). 80 tablet 1  . valsartan-hydrochlorothiazide (DIOVAN HCT) 160-25 MG per tablet Take 1 tablet by mouth every morning.    [name] acetaminophen (TYLENOL) 500 MG tablet Take 1,000 mg by mouth at bedtime as needed for mild pain.    . bisacodyl (DULCOLAX) 10 MG suppository Place 1 suppository (10 mg total) rectally daily as needed for moderate constipation. (Patient not taking: Reported on 11/20/2014) 12 suppository 0  . eszopiclone (LUNESTA) 1 MG TABS tablet Take 1 mg by mouth at bedtime as needed for sleep. Take immediately before bedtime    . ondansetron (ZOFRAN) 4 MG tablet Take 1 tablet (4 mg total) by mouth every 6 (six) hours as needed for nausea. (Patient not taking: Reported on 11/20/2014) 40 tablet 0  . oxyCODONE (OXY IR/ROXICODONE) 5 MG immediate release tablet Take 1-2 tablets (5-10 mg total) by mouth  every 3 (three) hours as needed for moderate pain, severe pain or breakthrough pain. (Patient not taking: Reported on 11/20/2014) 90 tablet 0  . rivaroxaban (XARELTO) 10 MG TABS tablet Take 1 tablet (10 mg total) by mouth daily with breakfast. Take Xarelto for two and a half more weeks, then discontinue Xarelto. Once the patient has completed the Xarelto, they may resume the 81 mg Aspirin. (Patient not taking: Reported on 11/20/2014) 18 tablet 0   No current facility-administered medications for this encounter.    Physical Findings: The patient is in no acute distress. Patient is alert and oriented.  height is 5\' 3"  (1.6 m) and weight is 142 lb 3.2 oz (64.501 kg). Her oral temperature is 97.7 F (36.5 C). Her blood pressure is 162/40 and her pulse is 77. Her  respiration is 20 and oxygen saturation is 100%. .   Clear to auscultation bilaterally  Lab Findings: Lab Results  Component Value Date   WBC 6.6 10/09/2014   HGB 10.0* 10/09/2014   HCT 32.6* 10/09/2014   MCV 86.0 10/09/2014   PLT 195 10/09/2014     Radiographic Findings: Ct Chest W Contrast  11/14/2014   CLINICAL DATA:  81 year old female with history of lung cancer diagnosed in February 2014 status post radiation therapy completed in 2014. Followup examination.  EXAM: CT CHEST WITH CONTRAST  TECHNIQUE: Multidetector CT imaging of the chest was performed during intravenous contrast administration.  CONTRAST:  37mL OMNIPAQUE IOHEXOL 300 MG/ML  SOLN  COMPARISON:  Chest CT 03/31/2014.  FINDINGS: Mediastinum/Lymph Nodes: Slight interval enlargement of a 1 cm short axis subcarinal lymph node. No definite hilar lymphadenopathy. Heart size is normal. There is no significant pericardial fluid, thickening or pericardial calcification. There is atherosclerosis of the thoracic aorta, the great vessels of the mediastinum and the coronary arteries, including calcified atherosclerotic plaque in the left main, left anterior descending and left circumflex coronary arteries. Moderate to large hiatal hernia. No axillary lymphadenopathy.  Lungs/Pleura: In the area of the treated mass in the left lower lobe there is again some architectural distortion. Importantly, however, when comparing image 37 of series 2 of today's examination with image 37 of series 2 of the prior study, there is increasing mass-like soft tissue in this region which has a more rounded border, and currently measures 3.3 x 2.7 cm, concerning for locally recurrent disease. Some associated chronic pleural thickening at the base of the left hemithorax is similar to prior examinations. In addition, there are now innumerable small pulmonary nodules scattered throughout the lungs bilaterally. The distribution of these nodules appears to be centrilobular,  rather than completely random in distribution, suggesting that the majority of these nodules are related to areas of mucoid impaction within terminal bronchioles, however, these are nonspecific. The largest new nodules include a 6 x 4 mm left upper lobe nodule (image 31 of series 4), and an ill-defined 6 mm ground-glass attenuation nodule (image 31 of series 4). No acute consolidative airspace disease.  Upper Abdomen: Small bilateral adrenal nodules unchanged over numerous prior examinations, previously characterized as adenomas. Atherosclerosis. Low-attenuation lesions in the left kidney are unchanged, compatible with simple cysts, with the largest of these measuring 2.6 cm in the posterior aspect of the left kidney. 3 mm nonobstructive calculus in the interpolar collecting system of the left kidney. Status post cholecystectomy.  Musculoskeletal/Soft Tissues: Old compression fracture of L1 with approximately 50% loss of anterior vertebral body height and an associated acute kyphotic deformity at the thoracolumbar junction, similar to  the prior examination. There are no aggressive appearing lytic or blastic lesions noted in the visualized portions of the skeleton.  IMPRESSION: 1. Interval enlargement of a mass-like area of soft tissue in the inferior aspect of the left lower lobe which currently measures 2.7 x 3.3 cm, concerning for potential locally recurrent neoplasm. In addition, there has been some enlargement of a 1 cm short axis subcarinal lymph node, which is nonspecific. Repeat evaluation with PET-CT is suggested at this time to exclude the possibility of recurrent disease. 2. In addition, there are now innumerable new small pulmonary nodules scattered throughout the lungs bilaterally. These appear to be predominantly centrilobular in distribution, and are therefore favored to be benign areas of mucoid impaction, however, close attention on future follow-up imaging is recommended to ensure the stability or  resolution of these findings. 3. Atherosclerosis, including left main and 2 vessel coronary artery disease. 4. Additional incidental findings, as above, similar to prior examinations. These results will be called to the ordering clinician or representative by the Radiologist Assistant, and communication documented in the PACS or zVision Dashboard.   Electronically Signed   By: Vinnie Langton M.D.   On: 11/14/2014 13:42    Impression:    The patient's CT scan showed some enlargement of the left lower lobe mass in addition to a lymph node, both of which may be indicative of recurrent/progressive disease. A PET scan has been recommended for further workup. I discussed this with the patient in some detail. We discussed that radiation effect can continue for quite a long time after treatment as well. It is not clear at this point that there is recurrent disease but certainly further workup is recommended and the patient does wish to proceed with this.  Plan:  The patient will get a PET scan as soon as possible and then return to clinic to review the results. She did want to discuss this in person after her upcoming scan.  I spent 15 minutes with the patient today, the majority of which was spent counseling the patient on the diagnosis of cancer and coordinating care.   Jodelle Gross, M.D., Ph.D.

## 2014-11-20 NOTE — Progress Notes (Signed)
Follow up s/p lung ca rad txs 12/31/12-01/10/13, had Ct Chest 11/14/14 results in, no c/o sob, 100% room air, no  Nausea, no coughing, did have right knee replacement January 2016, using walker, appetite good, no fatigue 3:57 PM

## 2014-11-21 ENCOUNTER — Telehealth: Payer: Self-pay | Admitting: *Deleted

## 2014-11-21 NOTE — Telephone Encounter (Signed)
CALLED PATIENT TO INFORM OF PET SCAN FOR 11-26-14, AND HER FU VISIT WITH DR. MOODY ON 11-27-14, SPOKE WITH PATIENT AND SHE IS AWARE OF THESE APPTS.

## 2014-11-26 ENCOUNTER — Ambulatory Visit (HOSPITAL_COMMUNITY)
Admission: RE | Admit: 2014-11-26 | Discharge: 2014-11-26 | Disposition: A | Discharge status: Home / Self Care | Payer: Medicare Other | Source: Ambulatory Visit | Attending: Radiation Oncology | Admitting: Radiation Oncology

## 2014-11-26 DIAGNOSIS — R918 Other nonspecific abnormal finding of lung field: Secondary | ICD-10-CM | POA: Diagnosis not present

## 2014-11-26 DIAGNOSIS — I517 Cardiomegaly: Secondary | ICD-10-CM | POA: Diagnosis not present

## 2014-11-26 DIAGNOSIS — I700 Atherosclerosis of aorta: Secondary | ICD-10-CM | POA: Diagnosis not present

## 2014-11-26 DIAGNOSIS — D3502 Benign neoplasm of left adrenal gland: Secondary | ICD-10-CM | POA: Insufficient documentation

## 2014-11-26 DIAGNOSIS — C3432 Malignant neoplasm of lower lobe, left bronchus or lung: Secondary | ICD-10-CM | POA: Diagnosis present

## 2014-11-26 DIAGNOSIS — D3501 Benign neoplasm of right adrenal gland: Secondary | ICD-10-CM | POA: Insufficient documentation

## 2014-11-26 DIAGNOSIS — I2510 Atherosclerotic heart disease of native coronary artery without angina pectoris: Secondary | ICD-10-CM | POA: Insufficient documentation

## 2014-11-26 LAB — GLUCOSE, CAPILLARY: Glucose-Capillary: 100 mg/dL — ABNORMAL HIGH (ref 70–99)

## 2014-11-26 MED ORDER — FLUDEOXYGLUCOSE F - 18 (FDG) INJECTION
7.7400 | Freq: Once | INTRAVENOUS | Status: AC | PRN
  Administered 2014-11-26: 7.74 via INTRAVENOUS

## 2014-11-27 ENCOUNTER — Encounter: Payer: Self-pay | Admitting: Radiation Oncology

## 2014-11-27 ENCOUNTER — Ambulatory Visit
Admission: RE | Admit: 2014-11-27 | Discharge: 2014-11-27 | Disposition: A | Discharge status: Home / Self Care | Payer: Medicare Other | Source: Ambulatory Visit | Attending: Radiation Oncology | Admitting: Radiation Oncology

## 2014-11-27 VITALS — BP 170/46 | HR 79 | Temp 98.0°F | Resp 20 | Ht 63.0 in | Wt 143.2 lb

## 2014-11-27 DIAGNOSIS — C3432 Malignant neoplasm of lower lobe, left bronchus or lung: Secondary | ICD-10-CM

## 2014-11-27 NOTE — Progress Notes (Signed)
Radiation Oncology         [phone] ________________________________  Name: Stephanie Fletcher MRN: [id number]  Date: 11/27/2014  DOB: 1929-05-18  Follow-Up Visit Note  CC: Singh,Jasmine, MD  Melrose Nakayama, *  Diagnosis:   History of non-small cell lung cancer status post stereotactic body radiation treatment to the left lung  Interval Since Last Radiation:  The patient completed radiation treatment in May 2014   Narrative:  The patient returns today for follow-up after undergoing a PET scan for further information regarding the patient's left lung tumor.  The patient completed this on 11/26/2014. She relates no new changes clinically. Radiation changes were seen within the left lower lobe with mild residual hypermetabolic activity. This appeared consistent with radiation effect although active tumor could not be completely excluded. However this area looked fairly good on the PET scan. However, bilateral supraclavicular and mediastinal nodes were noted with some hypermetabolic activity. None of these represented bulky lymph nodes. This appeared potentially consistent with regional metastasis.  ALLERGIES:  is allergic to actos and fosamax.  Meds: Current Outpatient Prescriptions  Medication Sig Dispense Refill  . acetaminophen (TYLENOL) 500 MG tablet Take 1,000 mg by mouth at bedtime as needed for mild pain.    [name] dicyclomine (BENTYL) 20 MG tablet Take 20 mg by mouth every 6 (six) hours as needed (for stomach).    . docusate sodium (COLACE) 100 MG capsule Take 1 capsule (100 mg total) by mouth 2 (two) times daily. 10 capsule 0  . eszopiclone (LUNESTA) 1 MG TABS tablet Take 1 mg by mouth at bedtime as needed for sleep. Take immediately before bedtime    . glimepiride (AMARYL) 2 MG tablet Take 2 mg by mouth daily with breakfast.    . menthol-cetylpyridinium (CEPACOL) 3 MG lozenge Take 1 lozenge by mouth as needed for sore throat.    . metFORMIN (GLUCOPHAGE) 500 MG tablet Take  500-1,000 mg by mouth 3 (three) times daily. Take 2 tablets (1000 mg) every morning, 1 tablet (500 mg) at noon, 1 tablet (500 mg) every evening    . methocarbamol (ROBAXIN) 500 MG tablet Take 1 tablet (500 mg total) by mouth every 6 (six) hours as needed for muscle spasms. 80 tablet 0  . metoCLOPramide (REGLAN) 5 MG tablet Take 1 tablet (5 mg total) by mouth every 8 (eight) hours as needed for nausea (if ondansetron (ZOFRAN) ineffective.). 40 tablet 0  . ondansetron (ZOFRAN) 4 MG tablet Take 1 tablet (4 mg total) by mouth every 6 (six) hours as needed for nausea. 40 tablet 0  . oxyCODONE (OXY IR/ROXICODONE) 5 MG immediate release tablet Take 1-2 tablets (5-10 mg total) by mouth every 3 (three) hours as needed for moderate pain, severe pain or breakthrough pain. 90 tablet 0  . pantoprazole (PROTONIX) 40 MG tablet Take 40 mg by mouth daily.    . pioglitazone (ACTOS) 15 MG tablet Take 15 mg by mouth daily with breakfast.     . polyethylene glycol (MIRALAX / GLYCOLAX) packet Take 17 g by mouth daily as needed (constipation).    . pravastatin (PRAVACHOL) 20 MG tablet Take 20 mg by mouth at bedtime.    . rivaroxaban (XARELTO) 10 MG TABS tablet Take 1 tablet (10 mg total) by mouth daily with breakfast. Take Xarelto for two and a half more weeks, then discontinue Xarelto. Once the patient has completed the Xarelto, they may resume the 81 mg Aspirin. 18 tablet 0  . sitaGLIPtin (JANUVIA) 100 MG tablet Take  100 mg by mouth daily with breakfast.     . traMADol (ULTRAM) 50 MG tablet Take 1-2 tablets (50-100 mg total) by mouth every 6 (six) hours as needed (mild pain). 80 tablet 1  . valsartan-hydrochlorothiazide (DIOVAN HCT) 160-25 MG per tablet Take 1 tablet by mouth every morning.    . bisacodyl (DULCOLAX) 10 MG suppository Place 1 suppository (10 mg total) rectally daily as needed for moderate constipation. (Patient not taking: Reported on 11/27/2014) 12 suppository 0   No current facility-administered  medications for this encounter.    Physical Findings: The patient is in no acute distress. Patient is alert and oriented.  height is 5\' 3"  (1.6 m) and weight is 143 lb 3.2 oz (64.955 kg). Her oral temperature is 98 F (36.7 C). Her blood pressure is 170/46 and her pulse is 79. Her respiration is 20 and oxygen saturation is 100%. .     Lab Findings: Lab Results  Component Value Date   WBC 6.6 10/09/2014   HGB 10.0* 10/09/2014   HCT 32.6* 10/09/2014   MCV 86.0 10/09/2014   PLT 195 10/09/2014     Radiographic Findings: Ct Chest W Contrast  11/14/2014   CLINICAL DATA:  81 year old female with history of lung cancer diagnosed in February 2014 status post radiation therapy completed in 2014. Followup examination.  EXAM: CT CHEST WITH CONTRAST  TECHNIQUE: Multidetector CT imaging of the chest was performed during intravenous contrast administration.  CONTRAST:  48mL OMNIPAQUE IOHEXOL 300 MG/ML  SOLN  COMPARISON:  Chest CT 03/31/2014.  FINDINGS: Mediastinum/Lymph Nodes: Slight interval enlargement of a 1 cm short axis subcarinal lymph node. No definite hilar lymphadenopathy. Heart size is normal. There is no significant pericardial fluid, thickening or pericardial calcification. There is atherosclerosis of the thoracic aorta, the great vessels of the mediastinum and the coronary arteries, including calcified atherosclerotic plaque in the left main, left anterior descending and left circumflex coronary arteries. Moderate to large hiatal hernia. No axillary lymphadenopathy.  Lungs/Pleura: In the area of the treated mass in the left lower lobe there is again some architectural distortion. Importantly, however, when comparing image 37 of series 2 of today's examination with image 37 of series 2 of the prior study, there is increasing mass-like soft tissue in this region which has a more rounded border, and currently measures 3.3 x 2.7 cm, concerning for locally recurrent disease. Some associated chronic  pleural thickening at the base of the left hemithorax is similar to prior examinations. In addition, there are now innumerable small pulmonary nodules scattered throughout the lungs bilaterally. The distribution of these nodules appears to be centrilobular, rather than completely random in distribution, suggesting that the majority of these nodules are related to areas of mucoid impaction within terminal bronchioles, however, these are nonspecific. The largest new nodules include a 6 x 4 mm left upper lobe nodule (image 31 of series 4), and an ill-defined 6 mm ground-glass attenuation nodule (image 31 of series 4). No acute consolidative airspace disease.  Upper Abdomen: Small bilateral adrenal nodules unchanged over numerous prior examinations, previously characterized as adenomas. Atherosclerosis. Low-attenuation lesions in the left kidney are unchanged, compatible with simple cysts, with the largest of these measuring 2.6 cm in the posterior aspect of the left kidney. 3 mm nonobstructive calculus in the interpolar collecting system of the left kidney. Status post cholecystectomy.  Musculoskeletal/Soft Tissues: Old compression fracture of L1 with approximately 50% loss of anterior vertebral body height and an associated acute kyphotic deformity at the thoracolumbar  junction, similar to the prior examination. There are no aggressive appearing lytic or blastic lesions noted in the visualized portions of the skeleton.  IMPRESSION: 1. Interval enlargement of a mass-like area of soft tissue in the inferior aspect of the left lower lobe which currently measures 2.7 x 3.3 cm, concerning for potential locally recurrent neoplasm. In addition, there has been some enlargement of a 1 cm short axis subcarinal lymph node, which is nonspecific. Repeat evaluation with PET-CT is suggested at this time to exclude the possibility of recurrent disease. 2. In addition, there are now innumerable new small pulmonary nodules scattered  throughout the lungs bilaterally. These appear to be predominantly centrilobular in distribution, and are therefore favored to be benign areas of mucoid impaction, however, close attention on future follow-up imaging is recommended to ensure the stability or resolution of these findings. 3. Atherosclerosis, including left main and 2 vessel coronary artery disease. 4. Additional incidental findings, as above, similar to prior examinations. These results will be called to the ordering clinician or representative by the Radiologist Assistant, and communication documented in the PACS or zVision Dashboard.   Electronically Signed   By: Vinnie Langton M.D.   On: 11/14/2014 13:42   Nm Pet Image Restag (ps) Skull Base To Thigh  11/26/2014   CLINICAL DATA:  Subsequent treatment strategy for left lower lobe lung cancer. Diagnosed February 2014, status post SBRT.  EXAM: NUCLEAR MEDICINE PET SKULL BASE TO THIGH  TECHNIQUE: 7.0 mCi F-18 FDG was injected intravenously. Full-ring PET imaging was performed from the skull base to thigh after the radiotracer. CT data was obtained and used for attenuation correction and anatomic localization.  FASTING BLOOD GLUCOSE:  Value: 100 mg/dl  COMPARISON:  CT chest dated 11/14/2014.  PET-CT dated 11/13/2012.  FINDINGS: NECK  Hypermetabolism at the left thoracic inlet likely corresponds to a 6 mm short axis left supraclavicular node (series 4/ image 51), max SUV 4.4. 7 mm short axis right supraclavicular node (series 4/ image 52), max SUV 2.1.  CHEST  Scattered small bilateral pulmonary nodules, including a 6 mm nodule in the left upper lobe (series 6/ image 33), better visualized on recent dedicated CT chest. These are non FDG avid but beneath the size threshold for PET sensitivity. However, this appearance is not suggestive of metastatic disease.  Radiation changes in the left lower lobe with underlying opacity (series 4/image 84). Mild residual hypermetabolism, max SUV 3.2. This  appearance is improved from the pre treatment PET in 2014, max SUV 4.9.  No pleural effusion or pneumothorax.  9 mm short axis right paratracheal node (series 4/ image 64), max SUV 2.5. 10 mm short axis subcarinal node (series 4/image 70), max SUV 3.5.  Cardiomegaly. Coronary atherosclerosis. Atherosclerotic calcifications of the aortic arch.  ABDOMEN/PELVIS  No abnormal hypermetabolic activity within the liver, pancreas, or spleen.  16 mm right adrenal adenoma.  18 mm left adrenal adenoma.  Status post cholecystectomy. Atherosclerotic calcifications of the abdominal aorta and branch vessels.  No hypermetabolic lymph nodes in the abdomen or pelvis.  SKELETON  No focal hypermetabolic activity to suggest skeletal metastasis.  IMPRESSION: Radiation changes in the left lower lobe with mild residual hypermetabolism, viable tumor not excluded.  Bilateral supraclavicular and mediastinal nodal metastases.  Scattered small bilateral pulmonary nodules, measuring up to 6 mm in the left upper lobe, better evaluated on recent dedicated CT chest. This appearance is nonspecific but not overly suggestive of metastatic disease.  Bilateral adrenal adenomas, benign.   Electronically Signed  By: Julian Hy M.D.   On: 11/26/2014 15:51    Impression:    the patient's PET scan showed findings consistent with possible regional metastasis to the supraclavicular and mediastinal lymph nodes. The treated tumor did not show markedly elevated hypermetabolic activity. I discussed these findings with the patient and answered her questions.   Plan:  I would like to present the patient's case at thoracic tumor board to help determine how easily and how best appropriately we could biopsy the lymph nodes. None of these are bulky and the patient is doing well clinically. The patient did ask about further monitoring which may also be a consideration depending on how easily a biopsy can be obtained and what potential treatment options are  available for her. Her previous CT scan did show some new small pulmonary nodules scattered throughout the lungs bilaterally. These appeared to represent benign areas of mucoid impaction. This finding whether benign or malignant may also be a significant in terms of interpreting the PET scan results.   The patient will return to clinic next week and we can discuss what the consensus recommendations from tumor board were at that time and proceed accordingly.   I spent 10 minutes with the patient today, the majority of which was spent counseling the patient on the diagnosis of cancer and coordinating care.   Jodelle Gross, M.D., Ph.D.

## 2014-11-27 NOTE — Progress Notes (Signed)
Follow up  Lung cancer had Pet scan 11/26/14 here fo results, dry cough, no c/o pain, nausea, uses walker, 4:15 PM

## 2014-12-04 ENCOUNTER — Encounter: Payer: Self-pay | Admitting: Radiation Oncology

## 2014-12-04 ENCOUNTER — Ambulatory Visit
Admission: RE | Admit: 2014-12-04 | Discharge: 2014-12-04 | Disposition: A | Discharge status: Home / Self Care | Payer: Medicare Other | Source: Ambulatory Visit | Attending: Radiation Oncology | Admitting: Radiation Oncology

## 2014-12-04 VITALS — Wt 147.6 lb

## 2014-12-04 DIAGNOSIS — C3432 Malignant neoplasm of lower lobe, left bronchus or lung: Secondary | ICD-10-CM

## 2014-12-04 NOTE — Progress Notes (Signed)
Radiation Oncology         [phone]) [phone] ________________________________  Name: Stephanie Fletcher MRN: [id number]  Date: 12/04/2014  DOB: [birth year]  Follow-Up Visit Note  CC: Singh,Jasmine, MD  Melrose Nakayama, *  Diagnosis:    Non-small cell lung cancer of the left lung status post stereotactic body radiation treatment  Narrative:  The patient returns today for routine follow-up.  The patient was seen recently and we discussed the results of her PET scan. The patient has been presented to multidisciplinary thoracic clinic regarding this study which was reviewed. It was agreed that the hypermetabolic lymph node activity is highly worrisome for regional metastasis with regards to her lung cancer. However, the lymph nodes remain quite small, are not symptomatic, and would be difficult to biopsy at this time. I discussed all of this with the patient today. She states that she is doing well clinically with no symptoms currently. No change since she was last seen.                              ALLERGIES:  is allergic to actos and fosamax.  Meds: Current Outpatient Prescriptions  Medication Sig Dispense Refill  . acetaminophen (TYLENOL) 500 MG tablet Take 1,000 mg by mouth at bedtime as needed for mild pain.    . bisacodyl (DULCOLAX) 10 MG suppository Place 1 suppository (10 mg total) rectally daily as needed for moderate constipation. (Patient not taking: Reported on 11/27/2014) 12 suppository 0  . dicyclomine (BENTYL) 20 MG tablet Take 20 mg by mouth every 6 (six) hours as needed (for stomach).    . docusate sodium (COLACE) 100 MG capsule Take 1 capsule (100 mg total) by mouth 2 (two) times daily. 10 capsule 0  . eszopiclone (LUNESTA) 1 MG TABS tablet Take 1 mg by mouth at bedtime as needed for sleep. Take immediately before bedtime    . glimepiride (AMARYL) 2 MG tablet Take 2 mg by mouth daily with breakfast.    . menthol-cetylpyridinium (CEPACOL) 3 MG lozenge Take 1 lozenge by mouth as  needed for sore throat.    . metFORMIN (GLUCOPHAGE) 500 MG tablet Take 500-1,000 mg by mouth 3 (three) times daily. Take 2 tablets (1000 mg) every morning, 1 tablet (500 mg) at noon, 1 tablet (500 mg) every evening    . methocarbamol (ROBAXIN) 500 MG tablet Take 1 tablet (500 mg total) by mouth every 6 (six) hours as needed for muscle spasms. 80 tablet 0  . metoCLOPramide (REGLAN) 5 MG tablet Take 1 tablet (5 mg total) by mouth every 8 (eight) hours as needed for nausea (if ondansetron (ZOFRAN) ineffective.). 40 tablet 0  . ondansetron (ZOFRAN) 4 MG tablet Take 1 tablet (4 mg total) by mouth every 6 (six) hours as needed for nausea. 40 tablet 0  . oxyCODONE (OXY IR/ROXICODONE) 5 MG immediate release tablet Take 1-2 tablets (5-10 mg total) by mouth every 3 (three) hours as needed for moderate pain, severe pain or breakthrough pain. 90 tablet 0  . pantoprazole (PROTONIX) 40 MG tablet Take 40 mg by mouth daily.    . pioglitazone (ACTOS) 15 MG tablet Take 15 mg by mouth daily with breakfast.     . polyethylene glycol (MIRALAX / GLYCOLAX) packet Take 17 g by mouth daily as needed (constipation).    . pravastatin (PRAVACHOL) 20 MG tablet Take 20 mg by mouth at bedtime.    . rivaroxaban (XARELTO) 10 MG TABS  tablet Take 1 tablet (10 mg total) by mouth daily with breakfast. Take Xarelto for two and a half more weeks, then discontinue Xarelto. Once the patient has completed the Xarelto, they may resume the 81 mg Aspirin. 18 tablet 0  . sitaGLIPtin (JANUVIA) 100 MG tablet Take 100 mg by mouth daily with breakfast.     . traMADol (ULTRAM) 50 MG tablet Take 1-2 tablets (50-100 mg total) by mouth every 6 (six) hours as needed (mild pain). 80 tablet 1  . valsartan-hydrochlorothiazide (DIOVAN HCT) 160-25 MG per tablet Take 1 tablet by mouth every morning.     No current facility-administered medications for this encounter.    Physical Findings: The patient is in no acute distress. Patient is alert and  oriented.  weight is 147 lb 9.6 oz (66.951 kg). .     Lab Findings: Lab Results  Component Value Date   WBC 6.6 10/09/2014   HGB 10.0* 10/09/2014   HCT 32.6* 10/09/2014   MCV 86.0 10/09/2014   PLT 195 10/09/2014     Radiographic Findings: Ct Chest W Contrast  11/14/2014   CLINICAL DATA:  81 year old female with history of lung cancer diagnosed in February 2014 status post radiation therapy completed in 2014. Followup examination.  EXAM: CT CHEST WITH CONTRAST  TECHNIQUE: Multidetector CT imaging of the chest was performed during intravenous contrast administration.  CONTRAST:  59mL OMNIPAQUE IOHEXOL 300 MG/ML  SOLN  COMPARISON:  Chest CT 03/31/2014.  FINDINGS: Mediastinum/Lymph Nodes: Slight interval enlargement of a 1 cm short axis subcarinal lymph node. No definite hilar lymphadenopathy. Heart size is normal. There is no significant pericardial fluid, thickening or pericardial calcification. There is atherosclerosis of the thoracic aorta, the great vessels of the mediastinum and the coronary arteries, including calcified atherosclerotic plaque in the left main, left anterior descending and left circumflex coronary arteries. Moderate to large hiatal hernia. No axillary lymphadenopathy.  Lungs/Pleura: In the area of the treated mass in the left lower lobe there is again some architectural distortion. Importantly, however, when comparing image 37 of series 2 of today's examination with image 37 of series 2 of the prior study, there is increasing mass-like soft tissue in this region which has a more rounded border, and currently measures 3.3 x 2.7 cm, concerning for locally recurrent disease. Some associated chronic pleural thickening at the base of the left hemithorax is similar to prior examinations. In addition, there are now innumerable small pulmonary nodules scattered throughout the lungs bilaterally. The distribution of these nodules appears to be centrilobular, rather than completely random in  distribution, suggesting that the majority of these nodules are related to areas of mucoid impaction within terminal bronchioles, however, these are nonspecific. The largest new nodules include a 6 x 4 mm left upper lobe nodule (image 31 of series 4), and an ill-defined 6 mm ground-glass attenuation nodule (image 31 of series 4). No acute consolidative airspace disease.  Upper Abdomen: Small bilateral adrenal nodules unchanged over numerous prior examinations, previously characterized as adenomas. Atherosclerosis. Low-attenuation lesions in the left kidney are unchanged, compatible with simple cysts, with the largest of these measuring 2.6 cm in the posterior aspect of the left kidney. 3 mm nonobstructive calculus in the interpolar collecting system of the left kidney. Status post cholecystectomy.  Musculoskeletal/Soft Tissues: Old compression fracture of L1 with approximately 50% loss of anterior vertebral body height and an associated acute kyphotic deformity at the thoracolumbar junction, similar to the prior examination. There are no aggressive appearing lytic or blastic  lesions noted in the visualized portions of the skeleton.  IMPRESSION: 1. Interval enlargement of a mass-like area of soft tissue in the inferior aspect of the left lower lobe which currently measures 2.7 x 3.3 cm, concerning for potential locally recurrent neoplasm. In addition, there has been some enlargement of a 1 cm short axis subcarinal lymph node, which is nonspecific. Repeat evaluation with PET-CT is suggested at this time to exclude the possibility of recurrent disease. 2. In addition, there are now innumerable new small pulmonary nodules scattered throughout the lungs bilaterally. These appear to be predominantly centrilobular in distribution, and are therefore favored to be benign areas of mucoid impaction, however, close attention on future follow-up imaging is recommended to ensure the stability or resolution of these findings. 3.  Atherosclerosis, including left main and 2 vessel coronary artery disease. 4. Additional incidental findings, as above, similar to prior examinations. These results will be called to the ordering clinician or representative by the Radiologist Assistant, and communication documented in the PACS or zVision Dashboard.   Electronically Signed   By: Vinnie Langton M.D.   On: 11/14/2014 13:42   Nm Pet Image Restag (ps) Skull Base To Thigh  11/26/2014   CLINICAL DATA:  Subsequent treatment strategy for left lower lobe lung cancer. Diagnosed February 2014, status post SBRT.  EXAM: NUCLEAR MEDICINE PET SKULL BASE TO THIGH  TECHNIQUE: 7.0 mCi F-18 FDG was injected intravenously. Full-ring PET imaging was performed from the skull base to thigh after the radiotracer. CT data was obtained and used for attenuation correction and anatomic localization.  FASTING BLOOD GLUCOSE:  Value: 100 mg/dl  COMPARISON:  CT chest dated 11/14/2014.  PET-CT dated 11/13/2012.  FINDINGS: NECK  Hypermetabolism at the left thoracic inlet likely corresponds to a 6 mm short axis left supraclavicular node (series 4/ image 51), max SUV 4.4. 7 mm short axis right supraclavicular node (series 4/ image 52), max SUV 2.1.  CHEST  Scattered small bilateral pulmonary nodules, including a 6 mm nodule in the left upper lobe (series 6/ image 33), better visualized on recent dedicated CT chest. These are non FDG avid but beneath the size threshold for PET sensitivity. However, this appearance is not suggestive of metastatic disease.  Radiation changes in the left lower lobe with underlying opacity (series 4/image 84). Mild residual hypermetabolism, max SUV 3.2. This appearance is improved from the pre treatment PET in 2014, max SUV 4.9.  No pleural effusion or pneumothorax.  9 mm short axis right paratracheal node (series 4/ image 64), max SUV 2.5. 10 mm short axis subcarinal node (series 4/image 70), max SUV 3.5.  Cardiomegaly. Coronary atherosclerosis.  Atherosclerotic calcifications of the aortic arch.  ABDOMEN/PELVIS  No abnormal hypermetabolic activity within the liver, pancreas, or spleen.  16 mm right adrenal adenoma.  18 mm left adrenal adenoma.  Status post cholecystectomy. Atherosclerotic calcifications of the abdominal aorta and branch vessels.  No hypermetabolic lymph nodes in the abdomen or pelvis.  SKELETON  No focal hypermetabolic activity to suggest skeletal metastasis.  IMPRESSION: Radiation changes in the left lower lobe with mild residual hypermetabolism, viable tumor not excluded.  Bilateral supraclavicular and mediastinal nodal metastases.  Scattered small bilateral pulmonary nodules, measuring up to 6 mm in the left upper lobe, better evaluated on recent dedicated CT chest. This appearance is nonspecific but not overly suggestive of metastatic disease.  Bilateral adrenal adenomas, benign.   Electronically Signed   By: Julian Hy M.D.   On: 11/26/2014 15:51  Impression:    The patient clinically is stable and as above we discussed the results of the PET scan further including options. The consensus recommendation from thoracic clinic was to proceed with a repeat CT scan of the chest in 6-8 weeks. I reviewed this with the patient and answered her questions. She was pleased and fine with this recommendation.  Plan:  We will obtain a CT scan of the chest in 8 weeks and then have the patient come back in for follow-up.  I spent 15 minutes with the patient today, the majority of which was spent counseling the patient on the diagnosis of cancer and coordinating care.   Jodelle Gross, M.D., Ph.D.

## 2014-12-04 NOTE — Progress Notes (Signed)
Patient here to discuss further from last week follow up appt  11/27/14, no vitals necessary per Dr.Moody 4:21 PM

## 2014-12-05 ENCOUNTER — Telehealth: Payer: Self-pay | Admitting: *Deleted

## 2014-12-05 NOTE — Telephone Encounter (Signed)
CALLED PATIENT TO INFORM OF LAB, SCAN AND FU VISIT, LVM FOR A RETURN CALL

## 2015-01-03 NOTE — H&P (Signed)
PATIENT NAME:  Stephanie Fletcher, Stephanie Fletcher MR#:  [id number] DATE OF BIRTH:  04/21/1929  DATE OF ADMISSION:  07/04/2014  CHIEF COMPLAINT: Chest pain.   HISTORY OF PRESENT ILLNESS: Stephanie Fletcher is an 81 year old with a history of diabetes type 2, hypertension, hyperlipidemia, lung cancer and GERD who presents with complaints of chest pain starting last evening. She felt symptoms on and off through the night and then again this morning. She has not had any chest discomfort since around lunchtime. The pain was mainly in the epigastric, just under the breast, region. She did not have any shortness of breath, nausea or radiating pain to her jaw or her arm. The pain was not aggravated or brought on by exertion. She denied any indigestion, belching or dyspepsia symptoms. She has no fevers, chills, cough or back pain. She is a resident at [facility] and was seen by a nurse who noticed her heartbeat was irregular. She was encouraged to followup at the clinic for further evaluation. Her EKG in the clinic showed a regular sinus rhythm with unchanged right bundle branch block, however, lead III did show some ST changes. The patient currently does not have any chest pain.   REVIEW OF SYSTEMS: As per HPI. She denies any headache, sore throat, head congestion. She denies any abdominal pain, nausea, diarrhea, constipation. She denies any leg pain or lower extremity edema. She denies any rash. Otherwise, review of systems as per HPI.   PAST MEDICAL HISTORY: 1. Diabetes type 2.  2. Hypertension.  3. Hyperlipidemia.  4. Lung cancer; adenocarcinoma, 5 cm lung lesion treated with radiation.  5. GERD.  6. Diverticulosis.  7. History of stomach ulcers.  8. History of T8 vertebral fracture.  9. History of humerus fracture.  10. Diabetic neuropathy.  11. Left ventricular hypertrophy.  12. Anemia.  13. Degenerative joint disease.   PAST SURGICAL HISTORY: 1. Cholecystectomy.  2. Breast biopsies that were negative.   3. Hysterectomy with BSO.  4. Left carpal tunnel surgery.  5. Hernia repair, right inguinal.  6. Cataract extraction, bilateral.  7. Appendectomy.  8. Knee replacement.   SOCIAL HISTORY: Widowed. No children. A resident of [place]. A lifelong nonsmoker. No alcohol use.   FAMILY HISTORY: Mother with hypertension, stroke and arthritis. Father with heart failure and arthritis. Sister with arthritis and brother with prostate cancer and arthritis.   ALLERGIES:  1. ACTOS, SWELLING AT HIGHER DOSES.  2. VYTORIN CAUSES MUSCLE PAIN.  3. ALENDRONATE CAUSES RASH.   CURRENT MEDICATIONS: 1. Aspirin 81 mg daily.  2. Calcium with vitamin D 400 units 2 tablets daily.  3. Vitamin B12 injection once a month.  4. Bentyl 20 mg b.i.d. as needed.  5. Lunesta 1 mg at bedtime p.r.n.  6. Amaryl 2 mg daily in a.m.  7. Januvia 100 mg daily.  8. Metformin 500 mg daily.  9. Multivitamin daily.  10. Actos 15 mg daily.  11. MiraLax 17 grams daily p.r.n.  12. Pravachol 20 mg at bedtime.  13. Tramadol 50 mg every 6 hours p.r.n.  14. Diovan/hydrochlorothiazide 160/12.5 mg daily.  15. PreserVision AREDS 2 tablets daily.   PHYSICAL EXAMINATION: VITAL SIGNS: Blood pressure 120/52, pulse 80, temperature is 98.5.  GENERAL: Well-appearing female in no acute distress.  HEENT: Sclerae and conjunctivae are clear. EOMs are intact. Ears are clear with normal landmarks. Oropharynx with uvula midline. No erythema. Normal dentition. No lesions.  NECK: Supple with no thyromegaly or lymphadenopathy.  LUNGS: Clear to auscultation bilaterally.  HEART: Regular  rate and rhythm with occasional premature beat.  ABDOMEN: Soft, nontender, normal active bowel sounds.  EXTREMITIES: 2+ pulses, symmetric and no edema.  SKIN: Warm and dry, normal color.  NEUROLOGIC: Alert and oriented x 3, nonfocal.   ASSESSMENT AND PLAN: 1. Chest pain, atypical, possible EKG changes in lead III with ST depression. Admit for further evaluation  with cardiac enzymes every 4 hours x 3. This will be an observation patient. Continue aspirin. She was given Lovenox 40 mg subcutaneous daily prophylactic. Consider gastroesophageal reflux disease as etiology. We will add Protonix 40 mg daily.  2. Diabetes type 2, controlled; last A1c 7.5. Continue her current medications. Accu-Cheks before meals and at bedtime. Sliding scale coverage with harpers low dose SS. 3. Hypertension. Continue Diovan/ hydrochlorothiazide.  4. Check CBC, MET-C.      ____________________________ Marily Lente. Stephanie Wulff, PA-C rjt:TT D: 07/04/2014 18:00:46 ET T: 07/04/2014 18:46:08 ET JOB#: [id number]  cc: Marily Lente. Lolita Lenz, <Dictator> Marily Lente Souleymane Saiki PA ELECTRONICALLY SIGNED 07/07/2014 9:00

## 2015-02-03 ENCOUNTER — Ambulatory Visit (HOSPITAL_COMMUNITY)
Admission: RE | Admit: 2015-02-03 | Discharge: 2015-02-03 | Disposition: A | Discharge status: Home / Self Care | Payer: Medicare Other | Source: Ambulatory Visit | Attending: Radiation Oncology | Admitting: Radiation Oncology

## 2015-02-03 ENCOUNTER — Ambulatory Visit
Admission: RE | Admit: 2015-02-03 | Discharge: 2015-02-03 | Disposition: A | Discharge status: Home / Self Care | Payer: Medicare Other | Source: Ambulatory Visit | Attending: Radiation Oncology | Admitting: Radiation Oncology

## 2015-02-03 DIAGNOSIS — C3432 Malignant neoplasm of lower lobe, left bronchus or lung: Secondary | ICD-10-CM | POA: Diagnosis present

## 2015-02-03 LAB — BUN AND CREATININE (CC13)
BUN: 33.6 mg/dL — AB (ref 7.0–26.0)
Creatinine: 0.9 mg/dL (ref 0.6–1.1)
EGFR: 58 mL/min/{1.73_m2} — ABNORMAL LOW (ref >90)

## 2015-02-03 MED ORDER — IOHEXOL 300 MG/ML  SOLN
100.0000 mL | Freq: Once | INTRAMUSCULAR | Status: AC | PRN
  Administered 2015-02-03: 80 mL via INTRAVENOUS

## 2015-02-05 ENCOUNTER — Ambulatory Visit: Admission: RE | Admit: 2015-02-05 | Payer: Medicare Other | Source: Ambulatory Visit | Admitting: Radiation Oncology

## 2015-02-06 ENCOUNTER — Ambulatory Visit
Admission: RE | Admit: 2015-02-06 | Discharge: 2015-02-06 | Disposition: A | Discharge status: Home / Self Care | Payer: Medicare Other | Source: Ambulatory Visit | Attending: Radiation Oncology | Admitting: Radiation Oncology

## 2015-02-06 ENCOUNTER — Encounter: Payer: Self-pay | Admitting: Radiation Oncology

## 2015-02-06 VITALS — BP 143/53 | HR 69 | Temp 97.7°F | Resp 20 | Wt 147.2 lb

## 2015-02-06 DIAGNOSIS — C3432 Malignant neoplasm of lower lobe, left bronchus or lung: Secondary | ICD-10-CM

## 2015-02-06 NOTE — Progress Notes (Signed)
Follow up  S/p SBRT 12/31/12-01/10/13, LLLl BP 143/53 mmHg  Pulse 69  Temp(Src) 97.7 F (36.5 C) (Oral)  Resp 20  Wt 147 lb 3.2 oz (66.769 kg)  SpO2 100%  Wt Readings from Last 3 Encounters:  10/06/14 148 lb 8 oz (67.359 kg)  07/19/13 143 lb (64.864 kg)  07/15/13 143 lb (64.864 kg)  ung, Ct Chest results from 02/03/15 here, no c/o pain, no coughing,sob, no nausea, appetite gogod, energy good, "I don't jave any symptoms",  11:05 AM

## 2015-02-06 NOTE — Progress Notes (Signed)
Radiation Oncology         [phone] ________________________________  Name: Stephanie Fletcher MRN: [id number]  Date: 02/06/2015  DOB: Mar 03, 1929  Follow-Up Visit Note  CC: Singh,Jasmine, MD  Melrose Nakayama, *  Diagnosis:    Non-small cell lung cancer of the left lung status post stereotactic body radiation treatment  Narrative:  The patient returns today for follow-up.  The patient states that she is doing very well. She notes no significant changes in breathing. No pain in the chest region or distantly.  The patient had some worrisome changes on her previous imaging including a PET scan. This was reviewed in thoracic conference and a short-term follow-up scan was recommended. She comes in today to discuss her recent CT scan of the chest.                           ALLERGIES:  is allergic to actos and fosamax.  Meds: Current Outpatient Prescriptions  Medication Sig Dispense Refill  . acetaminophen (TYLENOL) 500 MG tablet Take 1,000 mg by mouth at bedtime as needed for mild pain.    [name] dicyclomine (BENTYL) 20 MG tablet Take 20 mg by mouth every 6 (six) hours as needed (for stomach).    . eszopiclone (LUNESTA) 1 MG TABS tablet Take 1 mg by mouth at bedtime as needed for sleep. Take immediately before bedtime    . glimepiride (AMARYL) 2 MG tablet Take 2 mg by mouth daily with breakfast.    . metFORMIN (GLUCOPHAGE) 500 MG tablet Take 500-1,000 mg by mouth 3 (three) times daily. Take 2 tablets (1000 mg) every morning, 1 tablet (500 mg) at noon, 1 tablet (500 mg) every evening    . methocarbamol (ROBAXIN) 500 MG tablet Take 1 tablet (500 mg total) by mouth every 6 (six) hours as needed for muscle spasms. 80 tablet 0  . pantoprazole (PROTONIX) 40 MG tablet Take 40 mg by mouth daily.    . pioglitazone (ACTOS) 15 MG tablet Take 15 mg by mouth daily with breakfast.     . polyethylene glycol (MIRALAX / GLYCOLAX) packet Take 17 g by mouth daily as needed (constipation).    . sitaGLIPtin  (JANUVIA) 100 MG tablet Take 100 mg by mouth daily with breakfast.     . traMADol (ULTRAM) 50 MG tablet Take 1-2 tablets (50-100 mg total) by mouth every 6 (six) hours as needed (mild pain). 80 tablet 1  . bisacodyl (DULCOLAX) 10 MG suppository Place 1 suppository (10 mg total) rectally daily as needed for moderate constipation. (Patient not taking: Reported on 11/27/2014) 12 suppository 0  . docusate sodium (COLACE) 100 MG capsule Take 1 capsule (100 mg total) by mouth 2 (two) times daily. (Patient not taking: Reported on 02/06/2015) 10 capsule 0  . menthol-cetylpyridinium (CEPACOL) 3 MG lozenge Take 1 lozenge by mouth as needed for sore throat.    . metoCLOPramide (REGLAN) 5 MG tablet Take 1 tablet (5 mg total) by mouth every 8 (eight) hours as needed for nausea (if ondansetron (ZOFRAN) ineffective.). (Patient not taking: Reported on 02/06/2015) 40 tablet 0  . ondansetron (ZOFRAN) 4 MG tablet Take 1 tablet (4 mg total) by mouth every 6 (six) hours as needed for nausea. (Patient not taking: Reported on 02/06/2015) 40 tablet 0  . oxyCODONE (OXY IR/ROXICODONE) 5 MG immediate release tablet Take 1-2 tablets (5-10 mg total) by mouth every 3 (three) hours as needed for moderate pain, severe pain or breakthrough pain. (  Patient not taking: Reported on 02/06/2015) 90 tablet 0  . pravastatin (PRAVACHOL) 20 MG tablet Take 20 mg by mouth at bedtime.    . rivaroxaban (XARELTO) 10 MG TABS tablet Take 1 tablet (10 mg total) by mouth daily with breakfast. Take Xarelto for two and a half more weeks, then discontinue Xarelto. Once the patient has completed the Xarelto, they may resume the 81 mg Aspirin. (Patient not taking: Reported on 02/06/2015) 18 tablet 0  . valsartan-hydrochlorothiazide (DIOVAN HCT) 160-25 MG per tablet Take 1 tablet by mouth every morning.     No current facility-administered medications for this encounter.    Physical Findings: The patient is in no acute distress. Patient is alert and oriented.   weight is 147 lb 3.2 oz (66.769 kg). Her oral temperature is 97.7 F (36.5 C). Her blood pressure is 143/53 and her pulse is 69. Her respiration is 20 and oxygen saturation is 100%. .   Lungs are clear to auscultation bilaterally. Regular rate and rhythm Abdomen is soft, nontender, nondistended No lymphedema present   Lab Findings: Lab Results  Component Value Date   WBC 6.6 10/09/2014   HGB 10.0* 10/09/2014   HCT 32.6* 10/09/2014   MCV 86.0 10/09/2014   PLT 195 10/09/2014     Radiographic Findings: Ct Chest W Contrast  02/03/2015   CLINICAL DATA:  Left lower lobe lung cancer status post SBRT  EXAM: CT CHEST WITH CONTRAST  TECHNIQUE: Multidetector CT imaging of the chest was performed during intravenous contrast administration.  CONTRAST:  54m OMNIPAQUE IOHEXOL 300 MG/ML  SOLN  COMPARISON:  PET-CT dated 11/26/2014.  CT chest dated 11/14/2014.  FINDINGS: Mediastinum/Nodes: Cardiomegaly.  No pericardial effusion.  Coronary atherosclerosis.  Atherosclerotic calcifications of the aortic arch.  Mediastinal lymphadenopathy, stable versus mildly increased, including:  --8 mm short axis right supraclavicular node (series 2/image 12), previously 7 mm  --10 mm short axis AP window node (series 2/ image 18), unchanged  --10 mm short axis right paratracheal node (series 2/image 20), unchanged  --11 mm short axis subcarinal node (series 2/ image 25), previously 10 mm  Visualized thyroid is unremarkable.  Lungs/Pleura: 4.9 x 4.5 cm left lower lobe mass (series 2/ image 33), previously 2.7 x 3.3 cm, increased.  Innumerable pulmonary nodules throughout the lungs bilaterally, measuring up to 6 mm, increased in number and many which have slightly increased in size from prior CT. This appearance suggests pulmonary metastases.  Small left pleural effusion, increased.  No pneumothorax.  Upper abdomen: Moderate hiatal hernia.  Bilateral adrenal nodules, measuring 1.5 x 1.7 cm on the right and 1.0 x 1.4 cm on the  left, these have been previously characterized as benign adrenal adenomas.  Vascular calcifications.  Colonic diverticulosis.  Musculoskeletal: Degenerative changes of the visualized thoracolumbar spine.  Moderate superior endplate compression fracture deformity at L1.  IMPRESSION: 4.9 x 4.5 cm left lower lobe mass, increased, reflecting viable tumor/recurrence.  Innumerable pulmonary nodules measuring up to 6 mm in the lungs bilaterally, increased in size and number, compatible with metastases.  Mild mediastinal/right supraclavicular lymphadenopathy, stable versus mildly increased.   Electronically Signed   By: SJulian HyM.D.   On: 02/03/2015 14:12    Impression:    The patient unfortunately has some evidence of progressive/metastatic disease at this time within the chest. The left lower lobe tumor has increased and multiple small pulmonary nodules have increased or are new. I discussed this with the patient including the possible implications of this.  Plan:  I believe the patient needs to have a discussion with medical oncology at this point. I have not ordered any further imaging or a biopsy at this time given the significant change in her recent CT scan. I would defer therefore to medical oncology for any further workup which they feel they need. No role for radiation treatment at this time. I will have the patient come back to follow-up in our clinic in 3 months.  The patient was seen today for 25 minutes, with the majority of the time spent counseling the patient on his diagnosis of cancer and coordinating his care.   I spent 15 minutes with the patient today, the majority of which was spent counseling the patient on the diagnosis of cancer and coordinating care.   Jodelle Gross, M.D., Ph.D.

## 2015-02-11 ENCOUNTER — Telehealth: Payer: Self-pay | Admitting: Internal Medicine

## 2015-02-11 NOTE — Telephone Encounter (Signed)
NEW PATIENT APPT- LEFT MESSAGE FOR PATIENT TO RETURN CALL.

## 2015-02-12 ENCOUNTER — Telehealth: Payer: Self-pay | Admitting: Internal Medicine

## 2015-02-12 NOTE — Telephone Encounter (Signed)
new patient appt-s/w patient and gave np appt for 06/20 @ 1:45 w/Dr. Julien Nordmann Referring Dr. Lisbeth Renshaw

## 2015-03-02 ENCOUNTER — Telehealth: Payer: Self-pay | Admitting: Internal Medicine

## 2015-03-02 ENCOUNTER — Ambulatory Visit: Payer: Medicare Other

## 2015-03-02 ENCOUNTER — Encounter: Payer: Self-pay | Admitting: Internal Medicine

## 2015-03-02 ENCOUNTER — Other Ambulatory Visit (HOSPITAL_BASED_OUTPATIENT_CLINIC_OR_DEPARTMENT_OTHER): Payer: Medicare Other

## 2015-03-02 ENCOUNTER — Ambulatory Visit (HOSPITAL_BASED_OUTPATIENT_CLINIC_OR_DEPARTMENT_OTHER): Payer: Medicare Other | Admitting: Internal Medicine

## 2015-03-02 VITALS — BP 172/44 | HR 65 | Temp 98.3°F | Resp 18 | Ht 63.0 in | Wt 151.5 lb

## 2015-03-02 DIAGNOSIS — C3430 Malignant neoplasm of lower lobe, unspecified bronchus or lung: Secondary | ICD-10-CM

## 2015-03-02 DIAGNOSIS — E119 Type 2 diabetes mellitus without complications: Secondary | ICD-10-CM

## 2015-03-02 DIAGNOSIS — I10 Essential (primary) hypertension: Secondary | ICD-10-CM

## 2015-03-02 DIAGNOSIS — C3432 Malignant neoplasm of lower lobe, left bronchus or lung: Secondary | ICD-10-CM | POA: Diagnosis not present

## 2015-03-02 LAB — CBC WITH DIFFERENTIAL/PLATELET
BASO%: 0.7 % (ref 0.0–2.0)
Basophils Absolute: 0 10*3/uL (ref 0.0–0.1)
EOS ABS: 0.2 10*3/uL (ref 0.0–0.5)
EOS%: 3.4 % (ref 0.0–7.0)
HCT: 32.8 % — ABNORMAL LOW (ref 34.8–46.6)
HGB: 10.6 g/dL — ABNORMAL LOW (ref 11.6–15.9)
LYMPH%: 14.5 % (ref 14.0–49.7)
MCH: 29.6 pg (ref 25.1–34.0)
MCHC: 32.2 g/dL (ref 31.5–36.0)
MCV: 91.9 fL (ref 79.5–101.0)
MONO#: 0.3 10*3/uL (ref 0.1–0.9)
MONO%: 6.9 % (ref 0.0–14.0)
NEUT%: 74.5 % (ref 38.4–76.8)
NEUTROS ABS: 3.8 10*3/uL (ref 1.5–6.5)
PLATELETS: 205 10*3/uL (ref 145–400)
RBC: 3.57 10*6/uL — ABNORMAL LOW (ref 3.70–5.45)
RDW: 13.7 % (ref 11.2–14.5)
WBC: 5 10*3/uL (ref 3.9–10.3)
lymph#: 0.7 10*3/uL — ABNORMAL LOW (ref 0.9–3.3)

## 2015-03-02 LAB — COMPREHENSIVE METABOLIC PANEL (CC13)
ALBUMIN: 3.3 g/dL — AB (ref 3.5–5.0)
ALT: 14 U/L (ref 0–55)
ANION GAP: 8 meq/L (ref 3–11)
AST: 15 U/L (ref 5–34)
Alkaline Phosphatase: 55 U/L (ref 40–150)
BUN: 32.1 mg/dL — AB (ref 7.0–26.0)
CALCIUM: 9.5 mg/dL (ref 8.4–10.4)
CHLORIDE: 109 meq/L (ref 98–109)
CO2: 24 meq/L (ref 22–29)
Creatinine: 1 mg/dL (ref 0.6–1.1)
EGFR: 54 mL/min/{1.73_m2} — ABNORMAL LOW (ref >90)
Glucose: 113 mg/dl (ref 70–140)
POTASSIUM: 4.8 meq/L (ref 3.5–5.1)
Sodium: 141 mEq/L (ref 136–145)
Total Bilirubin: 0.22 mg/dL (ref 0.20–1.20)
Total Protein: 6 g/dL — ABNORMAL LOW (ref 6.4–8.3)

## 2015-03-02 NOTE — Telephone Encounter (Signed)
Gave avs & calendar for July °

## 2015-03-02 NOTE — Progress Notes (Signed)
Checked in new pt with no financial concerns prior to seeing the dr.  Pt has my card for any billing questions, concerns or if financial assistance is needed.  ° °

## 2015-03-02 NOTE — Progress Notes (Signed)
[facility] [phone]   [phone]  CONSULT NOTE  REFERRING PHYSICIAN: Dr. Kyung Rudd  REASON FOR CONSULTATION:  81 years old white female with progressive lung cancer  HPI Stephanie Fletcher is a 81 y.o. female a never smoker with past medical history significant for a osteoarthritis, diabetes mellitus, hypertension and dyslipidemia as well as stage IB non-small cell lung cancer, adenocarcinoma diagnosed in February 2014 when she was found on preoperative chest x-ray to have left lower lobe lung mass. Imaging study at that time including CT scan of the chest and PET scan confirmed the finding of a stage IB non-small cell lung cancer and CT-guided core biopsy of the left lower lobe was consistent with adenocarcinoma and molecular assays at that time by clarient showed negative EGFR mutation and negative for gene translocation status post stereotactic radiotherapy under the care of Dr. Lisbeth Renshaw completed 01/10/2013.  After her radiotherapy the patient has been followed by Dr. Lisbeth Renshaw with CT scan of the chest at regular basis. Repeat CT scan of the chest on 11/14/2014 showed interval enlargement of a masslike area of soft tissue in the inferior aspect of the left lower lobe and at that time it measured 2.7 x 3.3 cm concerning for potential locally recurrent neoplasm. There was also enlargement of a 1.0 cm short axis subcarinal lymph node. In addition there were innumerable new small pulmonary nodules scattered throughout the lungs bilaterally. This was evaluated with a PET scan on 11/26/2014 and it showed radiation changes in the left lower lobe with mild residual hypermetabolism. There was bilateral supraclavicular and mediastinal nodal metastasis as well as a scattered small bilateral pulmonary nodules measuring up to 6 mm in the left upper lobe. The patient was followed by observation and repeat CT scan of the chest was performed on 02/03/2015 and it showed 4.9 x 4.5  cm left lower lobe mass increased reflecting viable tumor/recurrence. There was also innumerable pulmonary nodules measuring up to 6 mm in the lungs bilaterally, increased in size and number compatible with metastasis. There was also mild mediastinal and right supraclavicular lymphadenopathy mildly increased. Dr. Lisbeth Renshaw kindly referred the patient to me today for evaluation and recommendation regarding treatment of her progressive lung cancer. When seen today she is feeling fine except for mild fatigue. She denied having any significant chest pain, shortness breath, cough or hemoptysis. She has no nausea or vomiting, no fever or chills. She denied having any significant weight loss or night sweats. She denied having any headaches but she has visual changes secondary to macular degeneration. Family history significant for mother with a stroke, father with congestive heart failure. She had 2 brother with diabetes mellitus. The patient is a widow and had one son who died from brain tumor at age 33. She was accompanied today by her friend Carolynn Serve. She used to work as an Automotive engineer. She has no history of smoking, alcohol or drug abuse.   HPI  Past Medical History  Diagnosis Date  . Hypercholesterolemia   . Hypertension   . Diverticulitis   . Gastric ulcer   . Arthritis   . Anemia   . Back injury 1998    T-12 fracture (brace)  . Nodule of left lung   . GERD (gastroesophageal reflux disease)   . History of radiation therapy 12/31/2012-01/10/2013    60 gray to left lower lung  . Peripheral vascular disease 1957/1997    DVT bilateral following fall  . Hx of  cancer of lung 2014    tx with radiation only  . Numbness     left lower leg since left total knee nerve damage  . History of kidney stones     x1 attack many yrs ago  . Difficulty sleeping   . Cancer 2014    adenocarcinoma/  lung  . Diabetes mellitus without complication     type 2-metformin    Past Surgical History    Procedure Laterality Date  . Abdominal hysterectomy  1979  . Hernia repair Right 1972  . Eye surgery Bilateral 2007, 2008    cataracts  . Appendectomy  1949  . Breast biopsy Bilateral [id number]  . Carpal tunnel release Bilateral 1997  . Skin graft  1955    right groin  . Cholecystectomy    . Video bronchoscopy with endobronchial navigation N/A 11/22/2012    Procedure: VIDEO BRONCHOSCOPY WITH ENDOBRONCHIAL NAVIGATION;  Surgeon: Melrose Nakayama, MD;  Location: [facility];  Service: Thoracic;  Laterality: N/A;  . Total knee arthroplasty Left 04/22/2013    Procedure: LEFT TOTAL KNEE ARTHROPLASTY;  Surgeon: Gearlean Alf, MD;  Location: [facility];  Service: Orthopedics;  Laterality: Left;  . Superficial peroneal nerve release Left 07/19/2013    Procedure: LEFT PERONEAL NERVE DECOMPRESSION ;  Surgeon: Gearlean Alf, MD;  Location: [facility];  Service: Orthopedics;  Laterality: Left;  . Total knee arthroplasty Right 10/06/2014    Procedure: RIGHT TOTAL KNEE ARTHROPLASTY;  Surgeon: Gearlean Alf, MD;  Location: [facility];  Service: Orthopedics;  Laterality: Right;    Family History  Problem Relation Age of Onset  . Hypertension Mother   . Stroke Mother   . Hypertension Father   . Heart disease Father   . Hypertension Brother     Social History History  Substance Use Topics  . Smoking status: Never Smoker   . Smokeless tobacco: Never Used  . Alcohol Use: No    Allergies  Allergen Reactions  . Actos [Pioglitazone] Other (See Comments)    Rash with 30 mg tablet- tolerating 46m  . Fosamax [Alendronate] Rash    Current Outpatient Prescriptions  Medication Sig Dispense Refill  . aspirin EC 81 MG tablet Take 81 mg by mouth.    . bisacodyl (DULCOLAX) 10 MG suppository Place 1 suppository (10 mg total) rectally daily as needed for moderate constipation. 12 suppository 0  . cyanocobalamin (,VITAMIN B-12,) 1000 MCG/ML injection 1,000 mcg.    . dicyclomine (BENTYL) 20 MG tablet Take 20 mg  by mouth every 6 (six) hours as needed (for stomach).    . docusate sodium (COLACE) 100 MG capsule Take 1 capsule (100 mg total) by mouth 2 (two) times daily. 10 capsule 0  . eszopiclone (LUNESTA) 1 MG TABS tablet Take 1 mg by mouth at bedtime as needed for sleep. Take immediately before bedtime    . glimepiride (AMARYL) 2 MG tablet Take 2 mg by mouth daily with breakfast.    . glucose blood (ONE TOUCH ULTRA TEST) test strip Use 2 (two) times daily. Use as instructed.    . menthol-cetylpyridinium (CEPACOL) 3 MG lozenge Take 1 lozenge by mouth as needed for sore throat.    . metFORMIN (GLUCOPHAGE) 500 MG tablet Take 500-1,000 mg by mouth 3 (three) times daily. Take 2 tablets (1000 mg) every morning, 1 tablet (500 mg) at noon, 1 tablet (500 mg) every evening    . Multiple Vitamins-Minerals (PRESERVISION AREDS) TABS Take 1 each by mouth.    .[name]  pantoprazole (PROTONIX) 40 MG tablet Take 40 mg by mouth daily.    . pioglitazone (ACTOS) 15 MG tablet Take 15 mg by mouth daily with breakfast.     . polyethylene glycol (MIRALAX / GLYCOLAX) packet Take 17 g by mouth daily as needed (constipation).    . pravastatin (PRAVACHOL) 20 MG tablet Take 20 mg by mouth at bedtime.    . sitaGLIPtin (JANUVIA) 100 MG tablet Take 100 mg by mouth daily with breakfast.     . tiZANidine (ZANAFLEX) 2 MG tablet Take by mouth.    . traMADol (ULTRAM) 50 MG tablet Take 1-2 tablets (50-100 mg total) by mouth every 6 (six) hours as needed (mild pain). 80 tablet 1  . valsartan-hydrochlorothiazide (DIOVAN HCT) 160-25 MG per tablet Take 1 tablet by mouth every morning.    [name] acetaminophen (TYLENOL) 500 MG tablet Take 1,000 mg by mouth at bedtime as needed for mild pain.    [name] ondansetron (ZOFRAN) 4 MG tablet Take 1 tablet (4 mg total) by mouth every 6 (six) hours as needed for nausea. (Patient not taking: Reported on 02/06/2015) 40 tablet 0  . rivaroxaban (XARELTO) 10 MG TABS tablet Take 1 tablet (10 mg total) by mouth daily with breakfast.  Take Xarelto for two and a half more weeks, then discontinue Xarelto. Once the patient has completed the Xarelto, they may resume the 81 mg Aspirin. (Patient not taking: Reported on 02/06/2015) 18 tablet 0   No current facility-administered medications for this visit.    Review of Systems  Constitutional: negative Eyes: negative Ears, nose, mouth, throat, and face: negative Respiratory: negative Cardiovascular: negative Gastrointestinal: negative Genitourinary:negative Integument/breast: negative Hematologic/lymphatic: negative Musculoskeletal:negative Neurological: negative Behavioral/Psych: negative Endocrine: negative Allergic/Immunologic: negative  Physical Exam  UXL:KGMWN, healthy, no distress, well nourished and well developed SKIN: skin color, texture, turgor are normal, no rashes or significant lesions HEAD: Normocephalic, No masses, lesions, tenderness or abnormalities EYES: normal, PERRLA EARS: External ears normal, Canals clear OROPHARYNX:no exudate, no erythema and lips, buccal mucosa, and tongue normal  NECK: supple, no adenopathy, no JVD LYMPH:  no palpable lymphadenopathy, no hepatosplenomegaly BREAST:not examined LUNGS: clear to auscultation , and palpation HEART: regular rate & rhythm, no murmurs and no gallops ABDOMEN:abdomen soft, non-tender, normal bowel sounds and no masses or organomegaly BACK: Back symmetric, no curvature., No CVA tenderness EXTREMITIES:no joint deformities, effusion, or inflammation, no edema, no skin discoloration  NEURO: alert & oriented x 3 with fluent speech, no focal motor/sensory deficits  PERFORMANCE STATUS: ECOG 1  LABORATORY DATA: Lab Results  Component Value Date   WBC 5.0 03/02/2015   HGB 10.6* 03/02/2015   HCT 32.8* 03/02/2015   MCV 91.9 03/02/2015   PLT 205 03/02/2015      Chemistry      Component Value Date/Time   NA 141 03/02/2015 1403   NA 133* 10/08/2014 0520   NA 141 07/04/2014 1816   K 4.8 03/02/2015  1403   K 4.2 10/08/2014 0520   K 4.6 07/04/2014 1816   CL 102 10/08/2014 0520   CL 108* 07/04/2014 1816   CO2 24 03/02/2015 1403   CO2 22 10/08/2014 0520   CO2 28 07/04/2014 1816   BUN 32.1* 03/02/2015 1403   BUN 23 10/08/2014 0520   BUN 35* 07/04/2014 1816   CREATININE 1.0 03/02/2015 1403   CREATININE 0.99 10/08/2014 0520   CREATININE 1.01 07/04/2014 1816      Component Value Date/Time   CALCIUM 9.5 03/02/2015 1403   CALCIUM 8.5 10/08/2014  0520   CALCIUM 9.1 07/04/2014 1816   ALKPHOS 55 03/02/2015 1403   ALKPHOS 51 09/29/2014 1410   ALKPHOS 52 07/04/2014 1816   AST 15 03/02/2015 1403   AST 18 09/29/2014 1410   AST 15 07/04/2014 1816   ALT 14 03/02/2015 1403   ALT 11 09/29/2014 1410   ALT 18 07/04/2014 1816   BILITOT 0.22 03/02/2015 1403   BILITOT 0.3 09/29/2014 1410       RADIOGRAPHIC STUDIES: Ct Chest W Contrast  02/03/2015   CLINICAL DATA:  Left lower lobe lung cancer status post SBRT  EXAM: CT CHEST WITH CONTRAST  TECHNIQUE: Multidetector CT imaging of the chest was performed during intravenous contrast administration.  CONTRAST:  19m OMNIPAQUE IOHEXOL 300 MG/ML  SOLN  COMPARISON:  PET-CT dated 11/26/2014.  CT chest dated 11/14/2014.  FINDINGS: Mediastinum/Nodes: Cardiomegaly.  No pericardial effusion.  Coronary atherosclerosis.  Atherosclerotic calcifications of the aortic arch.  Mediastinal lymphadenopathy, stable versus mildly increased, including:  --8 mm short axis right supraclavicular node (series 2/image 12), previously 7 mm  --10 mm short axis AP window node (series 2/ image 18), unchanged  --10 mm short axis right paratracheal node (series 2/image 20), unchanged  --11 mm short axis subcarinal node (series 2/ image 25), previously 10 mm  Visualized thyroid is unremarkable.  Lungs/Pleura: 4.9 x 4.5 cm left lower lobe mass (series 2/ image 33), previously 2.7 x 3.3 cm, increased.  Innumerable pulmonary nodules throughout the lungs bilaterally, measuring up to 6 mm,  increased in number and many which have slightly increased in size from prior CT. This appearance suggests pulmonary metastases.  Small left pleural effusion, increased.  No pneumothorax.  Upper abdomen: Moderate hiatal hernia.  Bilateral adrenal nodules, measuring 1.5 x 1.7 cm on the right and 1.0 x 1.4 cm on the left, these have been previously characterized as benign adrenal adenomas.  Vascular calcifications.  Colonic diverticulosis.  Musculoskeletal: Degenerative changes of the visualized thoracolumbar spine.  Moderate superior endplate compression fracture deformity at L1.  IMPRESSION: 4.9 x 4.5 cm left lower lobe mass, increased, reflecting viable tumor/recurrence.  Innumerable pulmonary nodules measuring up to 6 mm in the lungs bilaterally, increased in size and number, compatible with metastases.  Mild mediastinal/right supraclavicular lymphadenopathy, stable versus mildly increased.   Electronically Signed   By: SJulian HyM.D.   On: 02/03/2015 14:12    ASSESSMENT: This is a very pleasant 81years old white female with highly suspicious metastatic non-small cell lung cancer, adenocarcinoma in a never smoker patient. This was initially diagnosed as a stage IB and the patient underwent stereotactic radiotherapy.   PLAN: I had a lengthy discussion with the patient and her friend today about her current and treatment options. I recommended for the patient to have [facility] if [facility] molecular biomarker testing to look for other actionable target mutation. If there is insufficient tissue, I will send a blood sample to [facility] molecular studies. If the patient has no actionable mutation, she would be consider for treatment with palliative systemic chemotherapy like carboplatin and Alimta for palliative care and hospice referral. I will see the patient back for follow-up visit in 3 weeks for reevaluation and more detailed discussion of her treatment options  based on the molecular studies. She was advised to call immediately if she has any concerning symptoms in the interval.  The patient voices understanding of current disease status and treatment options and is in agreement with the current  care plan.  All questions were answered. The patient knows to call the clinic with any problems, questions or concerns. We can certainly see the patient much sooner if necessary.  Thank you so much for allowing me to participate in the care of De Blanch. I will continue to follow up the patient with you and assist in her care.  I spent 40 minutes counseling the patient face to face. The total time spent in the appointment was 60 minutes.  Disclaimer: This note was dictated with voice recognition software. Similar sounding words can inadvertently be transcribed and may not be corrected upon review.   Keniyah Gelinas K. March 02, 2015, 3:02 PM

## 2015-03-03 ENCOUNTER — Encounter: Payer: Self-pay | Admitting: *Deleted

## 2015-03-03 ENCOUNTER — Other Ambulatory Visit (HOSPITAL_COMMUNITY)
Admission: RE | Admit: 2015-03-03 | Discharge: 2015-03-03 | Disposition: A | Discharge status: Home / Self Care | Payer: Medicare Other | Source: Ambulatory Visit | Attending: Internal Medicine | Admitting: Internal Medicine

## 2015-03-03 DIAGNOSIS — C3430 Malignant neoplasm of lower lobe, unspecified bronchus or lung: Secondary | ICD-10-CM | POA: Diagnosis present

## 2015-03-03 NOTE — Progress Notes (Signed)
Oncology Nurse Navigator Documentation  Oncology Nurse Navigator Flowsheets 03/03/2015  Navigator Encounter Type Other/Per Dr. Worthy Flank request.  I called pathology to see if tissue from 2014 was sent to [facility].  I spoke with Drue Dun and she state it has been sent to [facility].   Time Spent with Patient 15

## 2015-03-13 ENCOUNTER — Encounter (HOSPITAL_COMMUNITY): Payer: Self-pay

## 2015-03-26 ENCOUNTER — Ambulatory Visit: Payer: Medicare Other | Admitting: Hematology

## 2015-03-30 ENCOUNTER — Encounter: Payer: Self-pay | Admitting: Internal Medicine

## 2015-03-30 ENCOUNTER — Ambulatory Visit (HOSPITAL_BASED_OUTPATIENT_CLINIC_OR_DEPARTMENT_OTHER): Payer: Medicare Other

## 2015-03-30 ENCOUNTER — Ambulatory Visit (HOSPITAL_BASED_OUTPATIENT_CLINIC_OR_DEPARTMENT_OTHER): Payer: Medicare Other | Admitting: Internal Medicine

## 2015-03-30 ENCOUNTER — Telehealth: Payer: Self-pay | Admitting: Internal Medicine

## 2015-03-30 VITALS — BP 180/47 | HR 76 | Temp 98.1°F | Resp 17 | Ht 63.0 in | Wt 151.3 lb

## 2015-03-30 DIAGNOSIS — C3432 Malignant neoplasm of lower lobe, left bronchus or lung: Secondary | ICD-10-CM

## 2015-03-30 DIAGNOSIS — C3430 Malignant neoplasm of lower lobe, unspecified bronchus or lung: Secondary | ICD-10-CM

## 2015-03-30 MED ORDER — TEMAZEPAM 30 MG PO CAPS: 30.0000 mg | ORAL_CAPSULE | Freq: Every evening | ORAL | Status: DC | PRN

## 2015-03-30 MED ORDER — DEXAMETHASONE 4 MG PO TABS: ORAL_TABLET | ORAL | Status: DC

## 2015-03-30 MED ORDER — PROCHLORPERAZINE MALEATE 10 MG PO TABS: 10.0000 mg | ORAL_TABLET | Freq: Four times a day (QID) | ORAL | Status: DC | PRN

## 2015-03-30 MED ORDER — FOLIC ACID 1 MG PO TABS
1.0000 mg | ORAL_TABLET | Freq: Every day | ORAL | Status: DC
Start: 2015-03-30 — End: 2015-08-21

## 2015-03-30 MED ORDER — CYANOCOBALAMIN 1000 MCG/ML IJ SOLN
1000.0000 ug | Freq: Once | INTRAMUSCULAR | Status: AC
  Administered 2015-03-30: 1000 ug via INTRAMUSCULAR

## 2015-03-30 NOTE — Telephone Encounter (Signed)
Gave and printed appt sched and avs for pt for July and AUG...the patient wanted appts on thursdays

## 2015-03-30 NOTE — Progress Notes (Signed)
[place] [phone]   Fax:(336) [place], MD [place] [address]  DIAGNOSIS: Metastatic non-small cell lung cancer, adenocarcinoma with negative EGFR mutation and negative gene translocation initially diagnosed as stage IB in February 2014 with recurrence in March 2016.   PRIOR THERAPY: Status post stereotactic radiotherapy under the care of Dr. Lisbeth Renshaw completed 01/10/2013.  CURRENT THERAPY: Systemic chemotherapy with carboplatin for AUC of 5 and Alimta 500 MG/M2 every 3 weeks. First dose 04/06/2015.  INTERVAL HISTORY: Stephanie Fletcher 81 y.o. female returns to the clinic today for follow-up visit accompanied by her niece, [name]. The patient is doing fine today with no specific complaints except for mild shortness of breath. She was recently diagnosed with metastatic non-small cell lung cancer, adenocarcinoma and the molecular studies sent to [facility] one showed no evidence for EGFR mutation or ALK gene translocation. The patient is here today for evaluation and discussion of her treatment options. She has no significant fatigue or weakness. She denied having any significant fever or chills. The patient denied having any chest pain, cough or hemoptysis. She denied having any nausea or vomiting. She is here today for evaluation and discussion of her treatment options.  MEDICAL HISTORY: Past Medical History  Diagnosis Date  . Hypercholesterolemia   . Hypertension   . Diverticulitis   . Gastric ulcer   . Arthritis   . Anemia   . Back injury 1998    T-12 fracture (brace)  . Nodule of left lung   . GERD (gastroesophageal reflux disease)   . History of radiation therapy 12/31/2012-01/10/2013    60 gray to left lower lung  . Peripheral vascular disease 1957/1997    DVT bilateral following fall  . Hx of cancer of lung 2014    tx with radiation only  . Numbness     left lower leg since left  total knee nerve damage  . History of kidney stones     x1 attack many yrs ago  . Difficulty sleeping   . Cancer 2014    adenocarcinoma/  lung  . Diabetes mellitus without complication     type 2-metformin    ALLERGIES:  is allergic to actos and fosamax.  MEDICATIONS:  Current Outpatient Prescriptions  Medication Sig Dispense Refill  . acetaminophen (TYLENOL) 500 MG tablet Take 1,000 mg by mouth at bedtime as needed for mild pain.    [name] aspirin EC 81 MG tablet Take 81 mg by mouth.    . cyanocobalamin (,VITAMIN B-12,) 1000 MCG/ML injection 1,000 mcg.    . eszopiclone (LUNESTA) 1 MG TABS tablet Take 1 mg by mouth at bedtime as needed for sleep. Take immediately before bedtime    . glucose blood (ONE TOUCH ULTRA TEST) test strip Use 2 (two) times daily. Use as instructed.    . metFORMIN (GLUCOPHAGE) 500 MG tablet Take 500-1,000 mg by mouth 3 (three) times daily. Take 2 tablets (1000 mg) every morning, 1 tablet (500 mg) at noon, 1 tablet (500 mg) every evening    . Multiple Vitamins-Minerals (PRESERVISION AREDS) TABS Take 1 each by mouth.    . pantoprazole (PROTONIX) 40 MG tablet Take 40 mg by mouth daily.    . pioglitazone (ACTOS) 15 MG tablet Take 15 mg by mouth daily with breakfast.     . pravastatin (PRAVACHOL) 20 MG tablet Take 20 mg by mouth at bedtime.    . sitaGLIPtin (JANUVIA) 100 MG  tablet Take 100 mg by mouth daily with breakfast.     . valsartan-hydrochlorothiazide (DIOVAN HCT) 160-25 MG per tablet Take 1 tablet by mouth every morning.    . bisacodyl (DULCOLAX) 10 MG suppository Place 1 suppository (10 mg total) rectally daily as needed for moderate constipation. (Patient not taking: Reported on 03/30/2015) 12 suppository 0  . dicyclomine (BENTYL) 20 MG tablet Take 20 mg by mouth every 6 (six) hours as needed (for stomach).    . docusate sodium (COLACE) 100 MG capsule Take 1 capsule (100 mg total) by mouth 2 (two) times daily. (Patient not taking: Reported on 03/30/2015) 10 capsule  0  . glimepiride (AMARYL) 2 MG tablet Take 2 mg by mouth daily with breakfast.    . menthol-cetylpyridinium (CEPACOL) 3 MG lozenge Take 1 lozenge by mouth as needed for sore throat.    . ondansetron (ZOFRAN) 4 MG tablet Take 1 tablet (4 mg total) by mouth every 6 (six) hours as needed for nausea. (Patient not taking: Reported on 02/06/2015) 40 tablet 0  . polyethylene glycol (MIRALAX / GLYCOLAX) packet Take 17 g by mouth daily as needed (constipation).     No current facility-administered medications for this visit.    SURGICAL HISTORY:  Past Surgical History  Procedure Laterality Date  . Abdominal hysterectomy  1979  . Hernia repair Right 1972  . Eye surgery Bilateral 2007, 2008    cataracts  . Appendectomy  1949  . Breast biopsy Bilateral [id number]  . Carpal tunnel release Bilateral 1997  . Skin graft  1955    right groin  . Cholecystectomy    . Video bronchoscopy with endobronchial navigation N/A 11/22/2012    Procedure: VIDEO BRONCHOSCOPY WITH ENDOBRONCHIAL NAVIGATION;  Surgeon: Melrose Nakayama, MD;  Location: [facility];  Service: Thoracic;  Laterality: N/A;  . Total knee arthroplasty Left 04/22/2013    Procedure: LEFT TOTAL KNEE ARTHROPLASTY;  Surgeon: Gearlean Alf, MD;  Location: [facility];  Service: Orthopedics;  Laterality: Left;  . Superficial peroneal nerve release Left 07/19/2013    Procedure: LEFT PERONEAL NERVE DECOMPRESSION ;  Surgeon: Gearlean Alf, MD;  Location: [facility];  Service: Orthopedics;  Laterality: Left;  . Total knee arthroplasty Right 10/06/2014    Procedure: RIGHT TOTAL KNEE ARTHROPLASTY;  Surgeon: Gearlean Alf, MD;  Location: [facility];  Service: Orthopedics;  Laterality: Right;    REVIEW OF SYSTEMS:  Constitutional: negative Eyes: negative Ears, nose, mouth, throat, and face: negative Respiratory: positive for dyspnea on exertion Cardiovascular: negative Gastrointestinal: negative Genitourinary:negative Integument/breast:  negative Hematologic/lymphatic: negative Musculoskeletal:negative Neurological: negative Behavioral/Psych: negative Endocrine: negative Allergic/Immunologic: negative   PHYSICAL EXAMINATION: General appearance: alert, cooperative and no distress Head: Normocephalic, without obvious abnormality, atraumatic Neck: no adenopathy, no JVD, supple, symmetrical, trachea midline and thyroid not enlarged, symmetric, no tenderness/mass/nodules Lymph nodes: Cervical, supraclavicular, and axillary nodes normal. Resp: clear to auscultation bilaterally Back: symmetric, no curvature. ROM normal. No CVA tenderness. Cardio: regular rate and rhythm, S1, S2 normal, no murmur, click, rub or gallop GI: soft, non-tender; bowel sounds normal; no masses,  no organomegaly Extremities: extremities normal, atraumatic, no cyanosis or edema Neurologic: Alert and oriented X 3, normal strength and tone. Normal symmetric reflexes. Normal coordination and gait  ECOG PERFORMANCE STATUS: 1 - Symptomatic but completely ambulatory  Blood pressure 180/47, pulse 76, temperature 98.1 F (36.7 C), temperature source Oral, resp. rate 17, height '5\' 3"'  (1.6 m), weight 151 lb 4.8 oz (68.629 kg), SpO2 100 %.  LABORATORY DATA: Lab  Results  Component Value Date   WBC 5.0 03/02/2015   HGB 10.6* 03/02/2015   HCT 32.8* 03/02/2015   MCV 91.9 03/02/2015   PLT 205 03/02/2015      Chemistry      Component Value Date/Time   NA 141 03/02/2015 1403   NA 133* 10/08/2014 0520   NA 141 07/04/2014 1816   K 4.8 03/02/2015 1403   K 4.2 10/08/2014 0520   K 4.6 07/04/2014 1816   CL 102 10/08/2014 0520   CL 108* 07/04/2014 1816   CO2 24 03/02/2015 1403   CO2 22 10/08/2014 0520   CO2 28 07/04/2014 1816   BUN 32.1* 03/02/2015 1403   BUN 23 10/08/2014 0520   BUN 35* 07/04/2014 1816   CREATININE 1.0 03/02/2015 1403   CREATININE 0.99 10/08/2014 0520   CREATININE 1.01 07/04/2014 1816      Component Value Date/Time   CALCIUM 9.5  03/02/2015 1403   CALCIUM 8.5 10/08/2014 0520   CALCIUM 9.1 07/04/2014 1816   ALKPHOS 55 03/02/2015 1403   ALKPHOS 51 09/29/2014 1410   ALKPHOS 52 07/04/2014 1816   AST 15 03/02/2015 1403   AST 18 09/29/2014 1410   AST 15 07/04/2014 1816   ALT 14 03/02/2015 1403   ALT 11 09/29/2014 1410   ALT 18 07/04/2014 1816   BILITOT 0.22 03/02/2015 1403   BILITOT 0.3 09/29/2014 1410       RADIOGRAPHIC STUDIES: No results found.  ASSESSMENT AND PLAN: This is a very pleasant 81 years old white female with metastatic non-small cell lung cancer, adenocarcinoma with negative EGFR mutation and negative ALK gene translocation. I had a lengthy discussion with the patient and her niece today about her current disease status and treatment options. I discussed with the patient the option of palliative care and hospice referral versus consideration of systemic chemotherapy with carboplatin for AUC of 5 and Alimta 500 MG/M2 every 3 weeks. The patient is interested in proceeding with chemotherapy. I discussed with her the adverse effect of this treatment including but not limited to alopecia, myelosuppression, nausea and vomiting, peripheral neuropathy, liver or renal dysfunction. She is expected to start the first cycle of this treatment next week. I will arrange for the patient to receive vitamin B 12 injection today. I will also call her pharmacy with prescription for Compazine 10 mg by mouth every 6 hours as needed for nausea, Decadron 4 mg by mouth twice a day the day before, day of and day after the chemotherapy in addition to folic acid 1 mg by mouth daily. I will arrange for the patient to have a chemotherapy education class later this week before starting the first cycle of her treatment. Her last imaging studies was done more than 2 months ago. I will arrange for the patient to have repeat CT scan of the chest, abdomen and pelvis for restaging of her disease before starting the first dose of  chemotherapy. The patient would come back for follow-up visit in 2 weeks for reevaluation and management of any adverse effect of her treatment. For insomnia, I started the patient on Restoril 30 mg by mouth daily at bedtime. The patient was advised to call immediately if she has any concerning symptoms in the interval. The patient voices understanding of current disease status and treatment options and is in agreement with the current care plan.  All questions were answered. The patient knows to call the clinic with any problems, questions or concerns. We can certainly see the  patient much sooner if necessary.  I spent 15 minutes counseling the patient face to face. The total time spent in the appointment was 25 minutes.  Disclaimer: This note was dictated with voice recognition software. Similar sounding words can inadvertently be transcribed and may not be corrected upon review.

## 2015-04-01 ENCOUNTER — Telehealth: Payer: Self-pay | Admitting: *Deleted

## 2015-04-01 NOTE — Telephone Encounter (Signed)
error 

## 2015-04-02 ENCOUNTER — Other Ambulatory Visit: Payer: Medicare Other

## 2015-04-02 ENCOUNTER — Encounter: Payer: Self-pay | Admitting: *Deleted

## 2015-04-03 ENCOUNTER — Ambulatory Visit (HOSPITAL_COMMUNITY)
Admission: RE | Admit: 2015-04-03 | Discharge: 2015-04-03 | Disposition: A | Discharge status: Home / Self Care | Payer: Medicare Other | Source: Ambulatory Visit | Attending: Internal Medicine | Admitting: Internal Medicine

## 2015-04-03 DIAGNOSIS — R911 Solitary pulmonary nodule: Secondary | ICD-10-CM | POA: Insufficient documentation

## 2015-04-03 DIAGNOSIS — R590 Localized enlarged lymph nodes: Secondary | ICD-10-CM | POA: Diagnosis not present

## 2015-04-03 DIAGNOSIS — C3432 Malignant neoplasm of lower lobe, left bronchus or lung: Secondary | ICD-10-CM

## 2015-04-03 DIAGNOSIS — C3491 Malignant neoplasm of unspecified part of right bronchus or lung: Secondary | ICD-10-CM | POA: Diagnosis present

## 2015-04-03 DIAGNOSIS — K449 Diaphragmatic hernia without obstruction or gangrene: Secondary | ICD-10-CM | POA: Insufficient documentation

## 2015-04-03 MED ORDER — IOHEXOL 300 MG/ML  SOLN
100.0000 mL | Freq: Once | INTRAMUSCULAR | Status: AC | PRN
  Administered 2015-04-03: 100 mL via INTRAVENOUS

## 2015-04-06 ENCOUNTER — Encounter: Payer: Self-pay | Admitting: Radiation Oncology

## 2015-04-06 ENCOUNTER — Ambulatory Visit
Admission: RE | Admit: 2015-04-06 | Discharge: 2015-04-06 | Disposition: A | Discharge status: Home / Self Care | Payer: Medicare Other | Source: Ambulatory Visit | Attending: Radiation Oncology | Admitting: Radiation Oncology

## 2015-04-06 VITALS — BP 131/53 | HR 78 | Temp 97.9°F | Wt 149.0 lb

## 2015-04-06 DIAGNOSIS — C3430 Malignant neoplasm of lower lobe, unspecified bronchus or lung: Secondary | ICD-10-CM

## 2015-04-06 NOTE — Progress Notes (Signed)
Radiation Oncology         [phone] ________________________________  Name: Stephanie Fletcher MRN: [id number]  Date: 04/06/2015  DOB: 1929-07-27  Follow-Up Visit Note  CC: Singh,Jasmine, MD  Melrose Nakayama, *  Diagnosis: Non-small cell lung cancer of the left lung status post stereotactic body radiation treatment  Narrative: Patient for routine follow up completion of radiation to left lung in spring 2014 to review ct chest result. Denies pain, shortness of breath or cough.Scheduled for chemotherapy (carboplatin and alimta) on 04/09/15.There is an increases of left lower lung mass and innumerable pulmonary nodules throughout both lungs. The patient states that she is doing well. No worsening shortness of breath. No chest pain. No distant pain.  Interval Since Last Radiation:  The patient completed radiation treatment in May 2014  ALLERGIES:  is allergic to actos and fosamax.  Meds: Current Outpatient Prescriptions  Medication Sig Dispense Refill  . acetaminophen (TYLENOL) 500 MG tablet Take 1,000 mg by mouth at bedtime as needed for mild pain.    [name] aspirin EC 81 MG tablet Take 81 mg by mouth.    . cyanocobalamin (,VITAMIN B-12,) 1000 MCG/ML injection 1,000 mcg.    [name] dexamethasone (DECADRON) 4 MG tablet 4 mg po bid the day before, day of and day after chemo 40 tablet 1  . folic acid (FOLVITE) 1 MG tablet Take 1 tablet (1 mg total) by mouth daily. 30 tablet 4  . glimepiride (AMARYL) 2 MG tablet Take 2 mg by mouth daily with breakfast.    . glucose blood (ONE TOUCH ULTRA TEST) test strip Use 2 (two) times daily. Use as instructed.    . menthol-cetylpyridinium (CEPACOL) 3 MG lozenge Take 1 lozenge by mouth as needed for sore throat.    . metFORMIN (GLUCOPHAGE) 500 MG tablet Take 500-1,000 mg by mouth 3 (three) times daily. Take 2 tablets (1000 mg) every morning, 1 tablet (500 mg) at noon, 1 tablet (500 mg) every evening    . Multiple Vitamins-Minerals (PRESERVISION AREDS) TABS  Take 1 each by mouth.    . pantoprazole (PROTONIX) 40 MG tablet Take 40 mg by mouth daily.    . pioglitazone (ACTOS) 15 MG tablet Take 15 mg by mouth daily with breakfast.     . polyethylene glycol (MIRALAX / GLYCOLAX) packet Take 17 g by mouth daily as needed (constipation).    . pravastatin (PRAVACHOL) 20 MG tablet Take 20 mg by mouth at bedtime.    . prochlorperazine (COMPAZINE) 10 MG tablet Take 1 tablet (10 mg total) by mouth every 6 (six) hours as needed for nausea or vomiting. 30 tablet 0  . sitaGLIPtin (JANUVIA) 100 MG tablet Take 100 mg by mouth daily with breakfast.     . temazepam (RESTORIL) 30 MG capsule Take 1 capsule (30 mg total) by mouth at bedtime as needed for sleep. 30 capsule 0  . valsartan-hydrochlorothiazide (DIOVAN HCT) 160-25 MG per tablet Take 1 tablet by mouth every morning.    . bisacodyl (DULCOLAX) 10 MG suppository Place 1 suppository (10 mg total) rectally daily as needed for moderate constipation. (Patient not taking: Reported on 03/30/2015) 12 suppository 0  . dicyclomine (BENTYL) 20 MG tablet Take 20 mg by mouth every 6 (six) hours as needed (for stomach).    . docusate sodium (COLACE) 100 MG capsule Take 1 capsule (100 mg total) by mouth 2 (two) times daily. (Patient not taking: Reported on 03/30/2015) 10 capsule 0  . ondansetron (ZOFRAN) 4 MG tablet Take 1  tablet (4 mg total) by mouth every 6 (six) hours as needed for nausea. (Patient not taking: Reported on 02/06/2015) 40 tablet 0   No current facility-administered medications for this encounter.    Physical Findings: The patient is in no acute distress. Patient is alert and oriented.  weight is 149 lb (67.586 kg). Her temperature is 97.9 F (36.6 C). Her blood pressure is 131/53 and her pulse is 78. Her oxygen saturation is 100%. [name]   Respiratory: Clear to auscultation bilaterally  Lab Findings: Lab Results  Component Value Date   WBC 5.0 03/02/2015   HGB 10.6* 03/02/2015   HCT 32.8* 03/02/2015   MCV 91.9  03/02/2015   PLT 205 03/02/2015     Radiographic Findings: Ct Chest W Contrast  04/03/2015   CLINICAL DATA:  New diagnosis of right lung cancer.  EXAM: CT CHEST, ABDOMEN, AND PELVIS WITH CONTRAST  TECHNIQUE: Multidetector CT imaging of the chest, abdomen and pelvis was performed following the standard protocol during bolus administration of intravenous contrast.  CONTRAST:  111m OMNIPAQUE IOHEXOL 300 MG/ML  SOLN  COMPARISON:  02/03/2015  FINDINGS: CT CHEST FINDINGS  Mediastinum: Mild cardiac enlargement. There is no pericardial effusion. Aortic atherosclerosis noted. Calcifications within the LAD coronary artery noted. The trachea is patent and midline. Large hiatal hernia is again noted. Right supraclavicular lymph node measures 7 mm, image 11/ series 2. Previously 8 mm. Right peritracheal lymph node measures 1 cm, image 21/ series 2. Previously this measured the same. Sub- carinal lymph node measures 1.2 cm, image 26/series 2. Previously 1.1 cm.  Lungs/Pleura: Small left pleural effusion is similar in volume to the previous exam. The right lower lobe lung mass measures 6.8 by 4.3 by 2.3 cm. On the previous exam this measured 5.8 x 4.5 x 1.8 cm. Numerous tiny nodules are scattered throughout both lungs compatible with a widespread pulmonary metastasis. These are too numerous to count. The index nodule within the lingular portion of the left lung measures 7 mm, image 30/series 4. Previously this measured 6 mm.  Musculoskeletal: No aggressive lytic or sclerotic bone lesions. The bones are diffusely osteopenic and there is a mild scoliosis deformity involving the thoracic spine.  CT ABDOMEN AND PELVIS FINDINGS  Hepatobiliary: No suspicious liver abnormalities. Previous cholecystectomy.  Pancreas: Negative  Spleen: Negative  Adrenals/Urinary Tract: The right adrenal nodule measures 1.6 cm, image 47/series 2. Previously 1.7 cm. Left adrenal nodule is stable measuring 1.7 cm, image 47/series 2. Normal appearance  of the right kidney. Several left renal cysts noted. Urinary bladder is normal.  Stomach/Bowel: Large hiatal hernia. The small bowel loops have a normal course and caliber. No obstruction. Unremarkable appearance of the proximal colon. There are multiple distal colonic diverticula noted without acute inflammation.  Vascular/Lymphatic: Aortic atherosclerosis noted. No upper abdominal adenopathy identified. No inguinal adenopathy.  Reproductive: Previous hysterectomy.  Other: No ascites. No focal fluid collections. No evidence for peritoneal metastasis.  Musculoskeletal: No aggressive lytic or sclerotic bone lesions identified. The bones are diffusely osteopenic and there is multi level degenerative disc disease. Similar appearance of L1 compression fracture.  IMPRESSION: 1. The dominant left lower lobe lung mass demonstrates increased in size from previous exam. 2. Again noted are innumerable pulmonary nodules throughout both lungs. Index nodule in the lingula is slightly increased in size from previous exam. 3. Mediastinal and right supraclavicular adenopathy is not significantly changed in the interval. 4. No change in bilateral adrenal nodules previously characterized as benign adenomas. 5. Aortic atherosclerosis. 6.  Large hiatal hernia.   Electronically Signed   By: Kerby Moors M.D.   On: 04/03/2015 13:41   Ct Abdomen Pelvis W Contrast  04/03/2015   CLINICAL DATA:  New diagnosis of right lung cancer.  EXAM: CT CHEST, ABDOMEN, AND PELVIS WITH CONTRAST  TECHNIQUE: Multidetector CT imaging of the chest, abdomen and pelvis was performed following the standard protocol during bolus administration of intravenous contrast.  CONTRAST:  121m OMNIPAQUE IOHEXOL 300 MG/ML  SOLN  COMPARISON:  02/03/2015  FINDINGS: CT CHEST FINDINGS  Mediastinum: Mild cardiac enlargement. There is no pericardial effusion. Aortic atherosclerosis noted. Calcifications within the LAD coronary artery noted. The trachea is patent and  midline. Large hiatal hernia is again noted. Right supraclavicular lymph node measures 7 mm, image 11/ series 2. Previously 8 mm. Right peritracheal lymph node measures 1 cm, image 21/ series 2. Previously this measured the same. Sub- carinal lymph node measures 1.2 cm, image 26/series 2. Previously 1.1 cm.  Lungs/Pleura: Small left pleural effusion is similar in volume to the previous exam. The right lower lobe lung mass measures 6.8 by 4.3 by 2.3 cm. On the previous exam this measured 5.8 x 4.5 x 1.8 cm. Numerous tiny nodules are scattered throughout both lungs compatible with a widespread pulmonary metastasis. These are too numerous to count. The index nodule within the lingular portion of the left lung measures 7 mm, image 30/series 4. Previously this measured 6 mm.  Musculoskeletal: No aggressive lytic or sclerotic bone lesions. The bones are diffusely osteopenic and there is a mild scoliosis deformity involving the thoracic spine.  CT ABDOMEN AND PELVIS FINDINGS  Hepatobiliary: No suspicious liver abnormalities. Previous cholecystectomy.  Pancreas: Negative  Spleen: Negative  Adrenals/Urinary Tract: The right adrenal nodule measures 1.6 cm, image 47/series 2. Previously 1.7 cm. Left adrenal nodule is stable measuring 1.7 cm, image 47/series 2. Normal appearance of the right kidney. Several left renal cysts noted. Urinary bladder is normal.  Stomach/Bowel: Large hiatal hernia. The small bowel loops have a normal course and caliber. No obstruction. Unremarkable appearance of the proximal colon. There are multiple distal colonic diverticula noted without acute inflammation.  Vascular/Lymphatic: Aortic atherosclerosis noted. No upper abdominal adenopathy identified. No inguinal adenopathy.  Reproductive: Previous hysterectomy.  Other: No ascites. No focal fluid collections. No evidence for peritoneal metastasis.  Musculoskeletal: No aggressive lytic or sclerotic bone lesions identified. The bones are diffusely  osteopenic and there is multi level degenerative disc disease. Similar appearance of L1 compression fracture.  IMPRESSION: 1. The dominant left lower lobe lung mass demonstrates increased in size from previous exam. 2. Again noted are innumerable pulmonary nodules throughout both lungs. Index nodule in the lingula is slightly increased in size from previous exam. 3. Mediastinal and right supraclavicular adenopathy is not significantly changed in the interval. 4. No change in bilateral adrenal nodules previously characterized as benign adenomas. 5. Aortic atherosclerosis. 6. Large hiatal hernia.   Electronically Signed   By: TKerby MoorsM.D.   On: 04/03/2015 13:41    Impression: The patient is clinically is stable.   Plan:  6 month follow up. Patient is continuing to see medical oncology.   The patient was seen today for 15 minutes, with the majority of the time spent counseling the patient on his diagnosis of cancer and coordinating his care.   ------------------------------------------------  JJodelle Gross MD, PhD  This document serves as a record of services personally performed by JKyung Rudd MD. It was created on his behalf by  Derek Mound, a trained medical scribe. The creation of this record is based on the scribe's personal observations and the provider's statements to them. This document has been checked and approved by the attending provider.

## 2015-04-06 NOTE — Progress Notes (Addendum)
Patient for routine follow up completion of radiation to left lung in spring 2014 to review ct chest result.Denies pain, shortness of breath or cough.Scheduled for chemotherapy (carboplatin and alimta) on 04/09/15.There is an increases of lef lower lung mass and innumerable pulmonary nodules throughout both lungs. BP 131/53 mmHg  Pulse 78  Temp(Src) 97.9 F (36.6 C)  Wt 149 lb (67.586 kg)  SpO2 100%

## 2015-04-09 ENCOUNTER — Other Ambulatory Visit: Payer: Self-pay | Admitting: Internal Medicine

## 2015-04-09 ENCOUNTER — Ambulatory Visit (HOSPITAL_BASED_OUTPATIENT_CLINIC_OR_DEPARTMENT_OTHER): Payer: Medicare Other

## 2015-04-09 ENCOUNTER — Other Ambulatory Visit (HOSPITAL_BASED_OUTPATIENT_CLINIC_OR_DEPARTMENT_OTHER): Payer: Medicare Other

## 2015-04-09 VITALS — BP 137/62 | HR 58 | Temp 97.7°F | Resp 18

## 2015-04-09 DIAGNOSIS — Z5111 Encounter for antineoplastic chemotherapy: Secondary | ICD-10-CM

## 2015-04-09 DIAGNOSIS — C3432 Malignant neoplasm of lower lobe, left bronchus or lung: Secondary | ICD-10-CM

## 2015-04-09 LAB — COMPREHENSIVE METABOLIC PANEL (CC13)
ALT: 9 U/L (ref 0–55)
AST: 13 U/L (ref 5–34)
Albumin: 3.6 g/dL (ref 3.5–5.0)
Alkaline Phosphatase: 67 U/L (ref 40–150)
Anion Gap: 9 mEq/L (ref 3–11)
BUN: 41.3 mg/dL — ABNORMAL HIGH (ref 7.0–26.0)
CHLORIDE: 107 meq/L (ref 98–109)
CO2: 21 mEq/L — ABNORMAL LOW (ref 22–29)
CREATININE: 1.2 mg/dL — AB (ref 0.6–1.1)
Calcium: 10 mg/dL (ref 8.4–10.4)
EGFR: 41 mL/min/{1.73_m2} — ABNORMAL LOW (ref >90)
Glucose: 260 mg/dl — ABNORMAL HIGH (ref 70–140)
POTASSIUM: 4.7 meq/L (ref 3.5–5.1)
Sodium: 136 mEq/L (ref 136–145)
Total Bilirubin: 0.27 mg/dL (ref 0.20–1.20)
Total Protein: 6.8 g/dL (ref 6.4–8.3)

## 2015-04-09 LAB — CBC WITH DIFFERENTIAL/PLATELET
BASO%: 0 % (ref 0.0–2.0)
Basophils Absolute: 0 10*3/uL (ref 0.0–0.1)
EOS%: 0.3 % (ref 0.0–7.0)
Eosinophils Absolute: 0 10*3/uL (ref 0.0–0.5)
HCT: 34.2 % — ABNORMAL LOW (ref 34.8–46.6)
HGB: 10.9 g/dL — ABNORMAL LOW (ref 11.6–15.9)
LYMPH%: 12.2 % — ABNORMAL LOW (ref 14.0–49.7)
MCH: 29.3 pg (ref 25.1–34.0)
MCHC: 31.9 g/dL (ref 31.5–36.0)
MCV: 91.9 fL (ref 79.5–101.0)
MONO#: 0.5 10*3/uL (ref 0.1–0.9)
MONO%: 6.8 % (ref 0.0–14.0)
NEUT#: 5.6 10*3/uL (ref 1.5–6.5)
NEUT%: 80.7 % — ABNORMAL HIGH (ref 38.4–76.8)
Platelets: 268 10*3/uL (ref 145–400)
RBC: 3.72 10*6/uL (ref 3.70–5.45)
RDW: 12.6 % (ref 11.2–14.5)
WBC: 6.9 10*3/uL (ref 3.9–10.3)
lymph#: 0.8 10*3/uL — ABNORMAL LOW (ref 0.9–3.3)

## 2015-04-09 MED ORDER — SODIUM CHLORIDE 0.9 % IV SOLN
400.0000 mg/m2 | Freq: Once | INTRAVENOUS | Status: AC
  Administered 2015-04-09: 700 mg via INTRAVENOUS
  Filled 2015-04-09: qty 28

## 2015-04-09 MED ORDER — SODIUM CHLORIDE 0.9 % IV SOLN
Freq: Once | INTRAVENOUS | Status: AC
  Administered 2015-04-09: 11:00:00 via INTRAVENOUS
  Filled 2015-04-09: qty 8

## 2015-04-09 MED ORDER — SODIUM CHLORIDE 0.9 % IV SOLN
314.0000 mg | Freq: Once | INTRAVENOUS | Status: AC
  Administered 2015-04-09: 310 mg via INTRAVENOUS
  Filled 2015-04-09: qty 31

## 2015-04-09 MED ORDER — SODIUM CHLORIDE 0.9 % IV SOLN
Freq: Once | INTRAVENOUS | Status: AC
  Administered 2015-04-09: 10:00:00 via INTRAVENOUS

## 2015-04-09 NOTE — Patient Instructions (Addendum)
[facility] Discharge Instructions for Patients Receiving Chemotherapy  Today you received the following chemotherapy agents: Alimta and Carboplatin. Decrease the steroid (Dexamethasone 4 mg) dose to one tablet the day before and day after chemo.   To help prevent nausea and vomiting after your treatment, we encourage you to take your nausea medication: Compazine. Take one every 6 hours as needed. (This may make you drowsy.)   If you develop nausea and vomiting that is not controlled by your nausea medication, call the clinic.   BELOW ARE SYMPTOMS THAT SHOULD BE REPORTED IMMEDIATELY:  *FEVER GREATER THAN 100.5 F  *CHILLS WITH OR WITHOUT FEVER  NAUSEA AND VOMITING THAT IS NOT CONTROLLED WITH YOUR NAUSEA MEDICATION  *UNUSUAL SHORTNESS OF BREATH  *UNUSUAL BRUISING OR BLEEDING  TENDERNESS IN MOUTH AND THROAT WITH OR WITHOUT PRESENCE OF ULCERS  *URINARY PROBLEMS  *BOWEL PROBLEMS  UNUSUAL RASH Items with * indicate a potential emergency and should be followed up as soon as possible.  Feel free to call the clinic should you have any questions or concerns. The clinic phone number is [phone].  Please show the [facility] at check-in to the Emergency Department and triage nurse.

## 2015-04-09 NOTE — Progress Notes (Signed)
Pt monitors glucose level TID. Has been greater than 200 since beginning Decadron. Reviewed with Dr. Julien Nordmann, pt to decrease Decadron to 4 mg once day before and day after chemo. Pt voiced understanding.

## 2015-04-14 ENCOUNTER — Telehealth: Payer: Self-pay | Admitting: Medical Oncology

## 2015-04-14 NOTE — Telephone Encounter (Signed)
-----   Message from Brien Few, RN sent at 04/10/2015  3:07 PM EDT ----- Regarding: [place] follow up call [facility] Alimta Carbo 7/28. Tolerated well.

## 2015-04-14 NOTE — Telephone Encounter (Signed)
She feels good today , denies nausea-just tired . She went to PCP and he ordered sliding scale insulin for her to take only on days of chemo.

## 2015-04-16 ENCOUNTER — Ambulatory Visit (HOSPITAL_BASED_OUTPATIENT_CLINIC_OR_DEPARTMENT_OTHER): Payer: Medicare Other | Admitting: Nurse Practitioner

## 2015-04-16 ENCOUNTER — Telehealth: Payer: Self-pay | Admitting: *Deleted

## 2015-04-16 ENCOUNTER — Other Ambulatory Visit (HOSPITAL_BASED_OUTPATIENT_CLINIC_OR_DEPARTMENT_OTHER): Payer: Medicare Other

## 2015-04-16 ENCOUNTER — Telehealth: Payer: Self-pay | Admitting: Nurse Practitioner

## 2015-04-16 VITALS — BP 125/48 | HR 83 | Temp 98.5°F | Resp 17 | Ht 63.0 in | Wt 146.8 lb

## 2015-04-16 DIAGNOSIS — C3432 Malignant neoplasm of lower lobe, left bronchus or lung: Secondary | ICD-10-CM | POA: Diagnosis not present

## 2015-04-16 DIAGNOSIS — E119 Type 2 diabetes mellitus without complications: Secondary | ICD-10-CM | POA: Diagnosis not present

## 2015-04-16 DIAGNOSIS — D696 Thrombocytopenia, unspecified: Secondary | ICD-10-CM | POA: Diagnosis not present

## 2015-04-16 DIAGNOSIS — D709 Neutropenia, unspecified: Secondary | ICD-10-CM | POA: Diagnosis not present

## 2015-04-16 DIAGNOSIS — C3430 Malignant neoplasm of lower lobe, unspecified bronchus or lung: Secondary | ICD-10-CM

## 2015-04-16 LAB — CBC WITH DIFFERENTIAL/PLATELET
BASO%: 0 % (ref 0.0–2.0)
Basophils Absolute: 0 10*3/uL (ref 0.0–0.1)
EOS%: 9.5 % — ABNORMAL HIGH (ref 0.0–7.0)
Eosinophils Absolute: 0.2 10*3/uL (ref 0.0–0.5)
HCT: 29.8 % — ABNORMAL LOW (ref 34.8–46.6)
HEMOGLOBIN: 9.8 g/dL — AB (ref 11.6–15.9)
LYMPH#: 0.3 10*3/uL — AB (ref 0.9–3.3)
LYMPH%: 19 % (ref 14.0–49.7)
MCH: 29.4 pg (ref 25.1–34.0)
MCHC: 32.9 g/dL (ref 31.5–36.0)
MCV: 89.5 fL (ref 79.5–101.0)
MONO#: 0.1 10*3/uL (ref 0.1–0.9)
MONO%: 7.6 % (ref 0.0–14.0)
NEUT%: 63.9 % (ref 38.4–76.8)
NEUTROS ABS: 1 10*3/uL — AB (ref 1.5–6.5)
Platelets: 132 10*3/uL — ABNORMAL LOW (ref 145–400)
RBC: 3.33 10*6/uL — AB (ref 3.70–5.45)
RDW: 12.2 % (ref 11.2–14.5)
WBC: 1.6 10*3/uL — ABNORMAL LOW (ref 3.9–10.3)

## 2015-04-16 LAB — COMPREHENSIVE METABOLIC PANEL (CC13)
ALK PHOS: 50 U/L (ref 40–150)
ALT: 13 U/L (ref 0–55)
ANION GAP: 7 meq/L (ref 3–11)
AST: 18 U/L (ref 5–34)
Albumin: 2.9 g/dL — ABNORMAL LOW (ref 3.5–5.0)
BUN: 28.2 mg/dL — AB (ref 7.0–26.0)
CO2: 21 mEq/L — ABNORMAL LOW (ref 22–29)
Calcium: 8.7 mg/dL (ref 8.4–10.4)
Chloride: 100 mEq/L (ref 98–109)
Creatinine: 1.1 mg/dL (ref 0.6–1.1)
EGFR: 46 mL/min/{1.73_m2} — AB (ref >90)
Glucose: 212 mg/dl — ABNORMAL HIGH (ref 70–140)
Potassium: 4.6 mEq/L (ref 3.5–5.1)
SODIUM: 129 meq/L — AB (ref 136–145)
TOTAL PROTEIN: 6 g/dL — AB (ref 6.4–8.3)
Total Bilirubin: 0.32 mg/dL (ref 0.20–1.20)

## 2015-04-16 NOTE — Telephone Encounter (Signed)
Per staff message and POF I have scheduled appts. Advised scheduler of appts. JMW  

## 2015-04-16 NOTE — Telephone Encounter (Signed)
per pof to sch pt appt-set MW emailto move appt on 8/18 to coornidate w/MD appt-adv to sch pt trmt as well-adv pt will call once reply

## 2015-04-16 NOTE — Progress Notes (Signed)
  [place] OFFICE PROGRESS NOTE   DIAGNOSIS: Metastatic non-small cell lung cancer, adenocarcinoma with negative EGFR mutation and negative gene translocation initially diagnosed as stage IB in February 2014 with recurrence in March 2016.   PRIOR THERAPY: Status post stereotactic radiotherapy under the care of Dr. Lisbeth Renshaw completed 01/10/2013.  CURRENT THERAPY: Systemic chemotherapy with carboplatin for AUC of 5 and Alimta 500 MG/M2 every 3 weeks. First dose 04/09/2015.     INTERVAL HISTORY:   Stephanie Fletcher returns as scheduled. She completed cycle 1 carboplatin/Alimta 04/09/2015. She denies nausea/vomiting. No mouth sores. She has had a few loose stools over the past couple days. No significant diarrhea. No skin rash. She denies shortness breath. No cough or fever. No bleeding. She notes improvement in sleep since beginning Restoril.  Objective:  Vital signs in last 24 hours:  Blood pressure 125/48, pulse 83, temperature 98.5 F (36.9 C), temperature source Oral, resp. rate 17, height $RemoveBe'5\' 3"'LOYbxcciM$  (1.6 m), weight 146 lb 12.8 oz (66.588 kg), SpO2 98 %.    HEENT: No thrush or ulcers. Lymphatics: No palpable cervical or supra clavicular lymph nodes. Resp: Lungs clear bilaterally. Cardio: Regular rate and rhythm.  GI: Abdomen soft and nontender. No hepatomegaly. Vascular: No leg edema. Calves soft and nontender. Neuro: Alert and oriented.  Skin: No rash.    Lab Results:  Lab Results  Component Value Date   WBC 1.6* 04/16/2015   HGB 9.8* 04/16/2015   HCT 29.8* 04/16/2015   MCV 89.5 04/16/2015   PLT 132* 04/16/2015   NEUTROABS 1.0* 04/16/2015    Imaging:  No results found.  Medications: I have reviewed the patient's current medications.  Assessment/Plan: 1. Metastatic non-small cell lung cancer, adenocarcinoma with negative EGFR mutation and negative ALK gene translocation. Treatment initiated with carboplatin/Alimta every 3 weeks beginning 04/09/2015. 2. Diabetes  mellitus. On metformin, Amaryl, Actos, Januvia. Sliding scale insulin as needed.   Disposition: [name] appears stable. She has completed 1 cycle of carboplatin/Alimta. She seems to have tolerated it well. We discussed the mild neutropenia and thrombocytopenia on labs today. Precautions were reviewed. She understands to contact the office with fever, chills, other signs of infection, bleeding. She will return for a follow-up CBC in one week. She will return for a follow-up visit and cycle 2 carboplatin/Alimta in 2 weeks.  Plan reviewed with Dr. Julien Nordmann.    Ned Card ANP/GNP-BC   04/16/2015  10:01 AM

## 2015-04-16 NOTE — Telephone Encounter (Signed)
per reply from MW to call pt with updated appts for 8/18-cld & left message for pt to get updated sch 04/16/15

## 2015-04-23 ENCOUNTER — Other Ambulatory Visit (HOSPITAL_BASED_OUTPATIENT_CLINIC_OR_DEPARTMENT_OTHER): Payer: Medicare Other

## 2015-04-23 DIAGNOSIS — C3432 Malignant neoplasm of lower lobe, left bronchus or lung: Secondary | ICD-10-CM

## 2015-04-23 LAB — COMPREHENSIVE METABOLIC PANEL (CC13)
ALBUMIN: 3 g/dL — AB (ref 3.5–5.0)
ALK PHOS: 53 U/L (ref 40–150)
ALT: 20 U/L (ref 0–55)
AST: 19 U/L (ref 5–34)
Anion Gap: 6 mEq/L (ref 3–11)
BUN: 20.2 mg/dL (ref 7.0–26.0)
CALCIUM: 9.2 mg/dL (ref 8.4–10.4)
CHLORIDE: 105 meq/L (ref 98–109)
CO2: 23 mEq/L (ref 22–29)
Creatinine: 0.9 mg/dL (ref 0.6–1.1)
EGFR: 58 mL/min/{1.73_m2} — AB (ref >90)
GLUCOSE: 186 mg/dL — AB (ref 70–140)
POTASSIUM: 4.5 meq/L (ref 3.5–5.1)
Sodium: 134 mEq/L — ABNORMAL LOW (ref 136–145)
TOTAL PROTEIN: 5.9 g/dL — AB (ref 6.4–8.3)
Total Bilirubin: <0.2 mg/dL (ref 0.20–1.20)

## 2015-04-23 LAB — CBC WITH DIFFERENTIAL/PLATELET
BASO%: 0.2 % (ref 0.0–2.0)
BASOS ABS: 0 10*3/uL (ref 0.0–0.1)
EOS%: 9.9 % — ABNORMAL HIGH (ref 0.0–7.0)
Eosinophils Absolute: 0.3 10*3/uL (ref 0.0–0.5)
HCT: 28.5 % — ABNORMAL LOW (ref 34.8–46.6)
HEMOGLOBIN: 9.4 g/dL — AB (ref 11.6–15.9)
LYMPH%: 16 % (ref 14.0–49.7)
MCH: 29.4 pg (ref 25.1–34.0)
MCHC: 32.9 g/dL (ref 31.5–36.0)
MCV: 89.4 fL (ref 79.5–101.0)
MONO#: 0.2 10*3/uL (ref 0.1–0.9)
MONO%: 8.2 % (ref 0.0–14.0)
NEUT#: 1.9 10*3/uL (ref 1.5–6.5)
NEUT%: 65.7 % (ref 38.4–76.8)
Platelets: 102 10*3/uL — ABNORMAL LOW (ref 145–400)
RBC: 3.18 10*6/uL — ABNORMAL LOW (ref 3.70–5.45)
RDW: 12.7 % (ref 11.2–14.5)
WBC: 2.9 10*3/uL — ABNORMAL LOW (ref 3.9–10.3)
lymph#: 0.5 10*3/uL — ABNORMAL LOW (ref 0.9–3.3)

## 2015-04-29 ENCOUNTER — Other Ambulatory Visit: Payer: Self-pay | Admitting: Medical Oncology

## 2015-04-30 ENCOUNTER — Ambulatory Visit (HOSPITAL_BASED_OUTPATIENT_CLINIC_OR_DEPARTMENT_OTHER): Payer: Medicare Other | Admitting: Nurse Practitioner

## 2015-04-30 ENCOUNTER — Ambulatory Visit (HOSPITAL_BASED_OUTPATIENT_CLINIC_OR_DEPARTMENT_OTHER): Payer: Medicare Other

## 2015-04-30 ENCOUNTER — Other Ambulatory Visit: Payer: Medicare Other

## 2015-04-30 ENCOUNTER — Other Ambulatory Visit (HOSPITAL_BASED_OUTPATIENT_CLINIC_OR_DEPARTMENT_OTHER): Payer: Medicare Other

## 2015-04-30 ENCOUNTER — Other Ambulatory Visit: Payer: Self-pay | Admitting: Medical Oncology

## 2015-04-30 ENCOUNTER — Telehealth: Payer: Self-pay | Admitting: Medical Oncology

## 2015-04-30 VITALS — BP 171/42 | HR 64 | Temp 98.1°F | Resp 18 | Ht 63.0 in | Wt 151.5 lb

## 2015-04-30 DIAGNOSIS — E119 Type 2 diabetes mellitus without complications: Secondary | ICD-10-CM

## 2015-04-30 DIAGNOSIS — G4700 Insomnia, unspecified: Secondary | ICD-10-CM

## 2015-04-30 DIAGNOSIS — C3432 Malignant neoplasm of lower lobe, left bronchus or lung: Secondary | ICD-10-CM

## 2015-04-30 DIAGNOSIS — C3430 Malignant neoplasm of lower lobe, unspecified bronchus or lung: Secondary | ICD-10-CM

## 2015-04-30 DIAGNOSIS — Z5111 Encounter for antineoplastic chemotherapy: Secondary | ICD-10-CM

## 2015-04-30 LAB — CBC WITH DIFFERENTIAL/PLATELET
BASO%: 0.7 % (ref 0.0–2.0)
Basophils Absolute: 0 10*3/uL (ref 0.0–0.1)
EOS ABS: 0.1 10*3/uL (ref 0.0–0.5)
EOS%: 2 % (ref 0.0–7.0)
HCT: 29.5 % — ABNORMAL LOW (ref 34.8–46.6)
HGB: 9.8 g/dL — ABNORMAL LOW (ref 11.6–15.9)
LYMPH%: 11.7 % — AB (ref 14.0–49.7)
MCH: 30.2 pg (ref 25.1–34.0)
MCHC: 33.2 g/dL (ref 31.5–36.0)
MCV: 90.8 fL (ref 79.5–101.0)
MONO#: 0.4 10*3/uL (ref 0.1–0.9)
MONO%: 7.7 % (ref 0.0–14.0)
NEUT%: 77.9 % — ABNORMAL HIGH (ref 38.4–76.8)
NEUTROS ABS: 3.8 10*3/uL (ref 1.5–6.5)
Platelets: 350 10*3/uL (ref 145–400)
RBC: 3.25 10*6/uL — AB (ref 3.70–5.45)
RDW: 12.8 % (ref 11.2–14.5)
WBC: 4.9 10*3/uL (ref 3.9–10.3)
lymph#: 0.6 10*3/uL — ABNORMAL LOW (ref 0.9–3.3)

## 2015-04-30 LAB — COMPREHENSIVE METABOLIC PANEL (CC13)
ALT: 17 U/L (ref 0–55)
AST: 16 U/L (ref 5–34)
Albumin: 3.5 g/dL (ref 3.5–5.0)
Alkaline Phosphatase: 63 U/L (ref 40–150)
Anion Gap: 13 mEq/L — ABNORMAL HIGH (ref 3–11)
BUN: 22.3 mg/dL (ref 7.0–26.0)
CO2: 18 mEq/L — ABNORMAL LOW (ref 22–29)
Calcium: 9.5 mg/dL (ref 8.4–10.4)
Chloride: 102 mEq/L (ref 98–109)
Creatinine: 0.9 mg/dL (ref 0.6–1.1)
EGFR: 58 mL/min/{1.73_m2} — ABNORMAL LOW (ref >90)
GLUCOSE: 108 mg/dL (ref 70–140)
Potassium: 4.8 mEq/L (ref 3.5–5.1)
SODIUM: 133 meq/L — AB (ref 136–145)
Total Protein: 6.6 g/dL (ref 6.4–8.3)

## 2015-04-30 MED ORDER — SODIUM CHLORIDE 0.9 % IV SOLN
Freq: Once | INTRAVENOUS | Status: AC
  Administered 2015-04-30: 13:00:00 via INTRAVENOUS

## 2015-04-30 MED ORDER — TEMAZEPAM 30 MG PO CAPS: 30.0000 mg | ORAL_CAPSULE | Freq: Every evening | ORAL | Status: DC | PRN

## 2015-04-30 MED ORDER — SODIUM CHLORIDE 0.9 % IV SOLN
352.0000 mg | Freq: Once | INTRAVENOUS | Status: AC
  Administered 2015-04-30: 350 mg via INTRAVENOUS
  Filled 2015-04-30: qty 35

## 2015-04-30 MED ORDER — SODIUM CHLORIDE 0.9 % IV SOLN
400.0000 mg/m2 | Freq: Once | INTRAVENOUS | Status: AC
  Administered 2015-04-30: 700 mg via INTRAVENOUS
  Filled 2015-04-30: qty 28

## 2015-04-30 MED ORDER — SODIUM CHLORIDE 0.9 % IV SOLN
Freq: Once | INTRAVENOUS | Status: AC
  Administered 2015-04-30: 13:00:00 via INTRAVENOUS
  Filled 2015-04-30: qty 8

## 2015-04-30 NOTE — Telephone Encounter (Signed)
temazepam rx refill sent to pharmacy and receipt confirmed.

## 2015-04-30 NOTE — Progress Notes (Signed)
  [facility] OFFICE PROGRESS NOTE   DIAGNOSIS: Metastatic non-small cell lung cancer, adenocarcinoma with negative EGFR mutation and negative gene translocation initially diagnosed as stage IB in February 2014 with recurrence in March 2016.   PRIOR THERAPY: Status post stereotactic radiotherapy under the care of Dr. Lisbeth Renshaw completed 01/10/2013.  CURRENT THERAPY: Systemic chemotherapy with carboplatin for AUC of 5 and Alimta 500 MG/M2 every 3 weeks. First dose 04/09/2015.   INTERVAL HISTORY:   Ms. Latouche returns as scheduled. She completed cycle 1 carboplatin/Alimta 04/09/2015. She denies nausea/vomiting. No mouth sores. No diarrhea. She had a single episode of constipation relieved with prunes. She denies shortness breath. She has an occasional cough. No hemoptysis. No shortness of breath.  Objective:  Vital signs in last 24 hours:  Blood pressure 171/42, pulse 64, temperature 98.1 F (36.7 C), temperature source Oral, resp. rate 18, height _0  (1.6 m), weight 151 lb 8 oz (68.72 kg), SpO2 100 %.    HEENT: No thrush or ulcers. Resp: Lungs clear bilaterally. Cardio: Regular rate and rhythm. GI: Abdomen soft and nontender. No hepatomegaly. Vascular: No leg edema. Calves soft and nontender.    Lab Results:  Lab Results  Component Value Date   WBC 4.9 04/30/2015   HGB 9.8* 04/30/2015   HCT 29.5* 04/30/2015   MCV 90.8 04/30/2015   PLT 350 04/30/2015   NEUTROABS 3.8 04/30/2015    Imaging:  No results found.  Medications: I have reviewed the patient's current medications.  Assessment/Plan: 1. Metastatic non-small cell lung cancer, adenocarcinoma with negative EGFR mutation and negative ALK gene translocation. Treatment initiated with carboplatin/Alimta every 3 weeks beginning 04/09/2015. 2. Diabetes mellitus. On metformin, Amaryl, Actos, Januvia. Sliding scale insulin as needed.   Disposition: Ms. Stangler appears stable. She has completed 1 cycle of  carboplatin/Alimta. Plan to proceed with cycle 2 today as scheduled. Continue weekly labs. She will return for a follow-up visit and cycle 3 in 3 weeks. She will contact the office in the interim with any problems.    Ned Card ANP/GNP-BC   04/30/2015  12:31 PM

## 2015-04-30 NOTE — Patient Instructions (Signed)
[facility] Discharge Instructions for Patients Receiving Chemotherapy  Today you received the following chemotherapy agents alimta/carboplatin   To help prevent nausea and vomiting after your treatment, we encourage you to take your nausea medication as directed If you develop nausea and vomiting that is not controlled by your nausea medication, call the clinic.   BELOW ARE SYMPTOMS THAT SHOULD BE REPORTED IMMEDIATELY:  *FEVER GREATER THAN 100.5 F  *CHILLS WITH OR WITHOUT FEVER  NAUSEA AND VOMITING THAT IS NOT CONTROLLED WITH YOUR NAUSEA MEDICATION  *UNUSUAL SHORTNESS OF BREATH  *UNUSUAL BRUISING OR BLEEDING  TENDERNESS IN MOUTH AND THROAT WITH OR WITHOUT PRESENCE OF ULCERS  *URINARY PROBLEMS  *BOWEL PROBLEMS  UNUSUAL RASH Items with * indicate a potential emergency and should be followed up as soon as possible.  Feel free to call the clinic you have any questions or concerns. The clinic phone number is [phone].  

## 2015-05-07 ENCOUNTER — Other Ambulatory Visit (HOSPITAL_BASED_OUTPATIENT_CLINIC_OR_DEPARTMENT_OTHER): Payer: Medicare Other

## 2015-05-07 DIAGNOSIS — C3432 Malignant neoplasm of lower lobe, left bronchus or lung: Secondary | ICD-10-CM | POA: Diagnosis not present

## 2015-05-07 LAB — CBC WITH DIFFERENTIAL/PLATELET
BASO%: 1.2 % (ref 0.0–2.0)
Basophils Absolute: 0 10*3/uL (ref 0.0–0.1)
EOS%: 3.9 % (ref 0.0–7.0)
Eosinophils Absolute: 0 10*3/uL (ref 0.0–0.5)
HCT: 28.1 % — ABNORMAL LOW (ref 34.8–46.6)
HGB: 9.2 g/dL — ABNORMAL LOW (ref 11.6–15.9)
LYMPH%: 28 % (ref 14.0–49.7)
MCH: 29.9 pg (ref 25.1–34.0)
MCHC: 32.7 g/dL (ref 31.5–36.0)
MCV: 91.4 fL (ref 79.5–101.0)
MONO#: 0.1 10*3/uL (ref 0.1–0.9)
MONO%: 8.3 % (ref 0.0–14.0)
NEUT%: 58.6 % (ref 38.4–76.8)
NEUTROS ABS: 0.7 10*3/uL — AB (ref 1.5–6.5)
Platelets: 221 10*3/uL (ref 145–400)
RBC: 3.07 10*6/uL — AB (ref 3.70–5.45)
RDW: 13.5 % (ref 11.2–14.5)
WBC: 1.1 10*3/uL — AB (ref 3.9–10.3)
lymph#: 0.3 10*3/uL — ABNORMAL LOW (ref 0.9–3.3)

## 2015-05-07 LAB — COMPREHENSIVE METABOLIC PANEL (CC13)
ALBUMIN: 3.1 g/dL — AB (ref 3.5–5.0)
ALK PHOS: 58 U/L (ref 40–150)
ALT: 14 U/L (ref 0–55)
AST: 19 U/L (ref 5–34)
Anion Gap: 9 mEq/L (ref 3–11)
BUN: 25.4 mg/dL (ref 7.0–26.0)
CO2: 21 meq/L — AB (ref 22–29)
Calcium: 9.3 mg/dL (ref 8.4–10.4)
Chloride: 105 mEq/L (ref 98–109)
Creatinine: 1.3 mg/dL — ABNORMAL HIGH (ref 0.6–1.1)
EGFR: 39 mL/min/{1.73_m2} — ABNORMAL LOW (ref >90)
GLUCOSE: 199 mg/dL — AB (ref 70–140)
POTASSIUM: 4.6 meq/L (ref 3.5–5.1)
SODIUM: 136 meq/L (ref 136–145)
Total Bilirubin: 0.26 mg/dL (ref 0.20–1.20)
Total Protein: 6.3 g/dL — ABNORMAL LOW (ref 6.4–8.3)

## 2015-05-12 ENCOUNTER — Encounter: Payer: Self-pay | Admitting: *Deleted

## 2015-05-12 ENCOUNTER — Observation Stay
Admission: EM | Admit: 2015-05-12 | Discharge: 2015-05-13 | Disposition: A | Discharge status: Home / Self Care | Payer: Medicare Other | Attending: Internal Medicine | Admitting: Internal Medicine

## 2015-05-12 DIAGNOSIS — Z79899 Other long term (current) drug therapy: Secondary | ICD-10-CM | POA: Insufficient documentation

## 2015-05-12 DIAGNOSIS — Z87442 Personal history of urinary calculi: Secondary | ICD-10-CM | POA: Insufficient documentation

## 2015-05-12 DIAGNOSIS — Z888 Allergy status to other drugs, medicaments and biological substances status: Secondary | ICD-10-CM | POA: Insufficient documentation

## 2015-05-12 DIAGNOSIS — M21372 Foot drop, left foot: Secondary | ICD-10-CM | POA: Diagnosis not present

## 2015-05-12 DIAGNOSIS — D649 Anemia, unspecified: Secondary | ICD-10-CM

## 2015-05-12 DIAGNOSIS — I10 Essential (primary) hypertension: Secondary | ICD-10-CM | POA: Diagnosis not present

## 2015-05-12 DIAGNOSIS — E871 Hypo-osmolality and hyponatremia: Secondary | ICD-10-CM | POA: Insufficient documentation

## 2015-05-12 DIAGNOSIS — Z96653 Presence of artificial knee joint, bilateral: Secondary | ICD-10-CM | POA: Diagnosis not present

## 2015-05-12 DIAGNOSIS — D61818 Other pancytopenia: Secondary | ICD-10-CM | POA: Insufficient documentation

## 2015-05-12 DIAGNOSIS — E785 Hyperlipidemia, unspecified: Secondary | ICD-10-CM | POA: Diagnosis not present

## 2015-05-12 DIAGNOSIS — Z8249 Family history of ischemic heart disease and other diseases of the circulatory system: Secondary | ICD-10-CM | POA: Insufficient documentation

## 2015-05-12 DIAGNOSIS — E780 Pure hypercholesterolemia: Secondary | ICD-10-CM | POA: Insufficient documentation

## 2015-05-12 DIAGNOSIS — I739 Peripheral vascular disease, unspecified: Secondary | ICD-10-CM | POA: Diagnosis not present

## 2015-05-12 DIAGNOSIS — Z9221 Personal history of antineoplastic chemotherapy: Secondary | ICD-10-CM | POA: Diagnosis not present

## 2015-05-12 DIAGNOSIS — D62 Acute posthemorrhagic anemia: Secondary | ICD-10-CM | POA: Diagnosis not present

## 2015-05-12 DIAGNOSIS — E119 Type 2 diabetes mellitus without complications: Secondary | ICD-10-CM | POA: Insufficient documentation

## 2015-05-12 DIAGNOSIS — Z9049 Acquired absence of other specified parts of digestive tract: Secondary | ICD-10-CM | POA: Diagnosis not present

## 2015-05-12 DIAGNOSIS — Z7982 Long term (current) use of aspirin: Secondary | ICD-10-CM | POA: Insufficient documentation

## 2015-05-12 DIAGNOSIS — G4700 Insomnia, unspecified: Secondary | ICD-10-CM | POA: Diagnosis not present

## 2015-05-12 DIAGNOSIS — Z794 Long term (current) use of insulin: Secondary | ICD-10-CM | POA: Diagnosis not present

## 2015-05-12 DIAGNOSIS — Z823 Family history of stroke: Secondary | ICD-10-CM | POA: Diagnosis not present

## 2015-05-12 DIAGNOSIS — Z90710 Acquired absence of both cervix and uterus: Secondary | ICD-10-CM | POA: Insufficient documentation

## 2015-05-12 DIAGNOSIS — K219 Gastro-esophageal reflux disease without esophagitis: Secondary | ICD-10-CM | POA: Diagnosis not present

## 2015-05-12 DIAGNOSIS — H6123 Impacted cerumen, bilateral: Secondary | ICD-10-CM | POA: Diagnosis not present

## 2015-05-12 DIAGNOSIS — Z85118 Personal history of other malignant neoplasm of bronchus and lung: Secondary | ICD-10-CM | POA: Diagnosis not present

## 2015-05-12 DIAGNOSIS — M179 Osteoarthritis of knee, unspecified: Secondary | ICD-10-CM | POA: Insufficient documentation

## 2015-05-12 DIAGNOSIS — R040 Epistaxis: Principal | ICD-10-CM | POA: Diagnosis present

## 2015-05-12 LAB — CBC WITH DIFFERENTIAL/PLATELET
Basophils Absolute: 0 10*3/uL (ref 0–0.1)
EOS ABS: 0 10*3/uL (ref 0–0.7)
Eosinophils Relative: 2 %
HCT: 24.5 % — ABNORMAL LOW (ref 35.0–47.0)
Hemoglobin: 7.9 g/dL — ABNORMAL LOW (ref 12.0–16.0)
Lymphocytes Relative: 14 %
Lymphs Abs: 0.3 10*3/uL — ABNORMAL LOW (ref 1.0–3.6)
MCH: 29.2 pg (ref 26.0–34.0)
MCHC: 32.3 g/dL (ref 32.0–36.0)
MCV: 90.5 fL (ref 80.0–100.0)
Monocytes Absolute: 0.2 10*3/uL (ref 0.2–0.9)
Neutro Abs: 1.3 10*3/uL — ABNORMAL LOW (ref 1.4–6.5)
Neutrophils Relative %: 71 %
PLATELETS: 84 10*3/uL — AB (ref 150–440)
RBC: 2.71 MIL/uL — AB (ref 3.80–5.20)
RDW: 13.3 % (ref 11.5–14.5)
WBC: 1.8 10*3/uL — AB (ref 3.6–11.0)

## 2015-05-12 LAB — APTT: aPTT: 32 seconds (ref 24–36)

## 2015-05-12 LAB — BASIC METABOLIC PANEL
Anion gap: 6 (ref 5–15)
BUN: 35 mg/dL — AB (ref 6–20)
CALCIUM: 8.5 mg/dL — AB (ref 8.9–10.3)
CO2: 24 mmol/L (ref 22–32)
CREATININE: 1.18 mg/dL — AB (ref 0.44–1.00)
Chloride: 105 mmol/L (ref 101–111)
GFR calc Af Amer: 48 mL/min — ABNORMAL LOW (ref >60)
GFR, EST NON AFRICAN AMERICAN: 41 mL/min — AB (ref >60)
Glucose, Bld: 186 mg/dL — ABNORMAL HIGH (ref 65–99)
Potassium: 4.7 mmol/L (ref 3.5–5.1)
SODIUM: 135 mmol/L (ref 135–145)

## 2015-05-12 LAB — PROTIME-INR
INR: 1.08
Prothrombin Time: 14.2 seconds (ref 11.4–15.0)

## 2015-05-12 MED ORDER — CLONIDINE HCL 0.1 MG PO TABS
0.1000 mg | ORAL_TABLET | Freq: Once | ORAL | Status: AC
  Administered 2015-05-12: 0.1 mg via ORAL

## 2015-05-12 MED ORDER — OXYMETAZOLINE HCL 0.05 % NA SOLN
NASAL | Status: AC
  Administered 2015-05-12: 21:00:00
  Filled 2015-05-12: qty 15

## 2015-05-12 MED ORDER — CLONIDINE HCL 0.1 MG PO TABS
ORAL_TABLET | ORAL | Status: AC
  Administered 2015-05-12: 0.1 mg via ORAL
  Filled 2015-05-12: qty 1

## 2015-05-12 NOTE — ED Notes (Addendum)
Pt arrived via EMS from private apartment at [facility] due to a nose bleed lasting 2 hours. Pt reports staff at [facility] pack both nares and bleeding had stopped for approx.15 minutes but then began again. Left nare is actively bleeding upon arrival to ED. Blood pressure is high upon arrival at 230/94. EMS reports pt has hx of HTN.  Pt is currently on chemo therapy for lung cancer.

## 2015-05-12 NOTE — ED Notes (Signed)
Pressure held to nose for 5 minutes. Nose is no longer bleeding at this time.

## 2015-05-12 NOTE — ED Provider Notes (Signed)
[facility] Emergency Department Provider Note  ____________________________________________  Time seen: Approximately 9:16 PM  I have reviewed the triage vital signs and the nursing notes.   HISTORY  Chief Complaint Epistaxis    HPI Stephanie Fletcher is a 81 y.o. female with hypertension, hypercholesterolemia, GERD, lung cancer on chemotherapy who presents for evaluation ofsided epistaxis, sudden onset approximately 2 hours prior to arrival, intermittent. She denies any trauma to her nose. She reports she has had nosebleeds in the past. Her nose was packed at [facility] and that seemed to help the bleeding. On EMS arrival, bleeding appeared to be resolved. Her blood pressure was noted to be elevated with a systolic in the 782N. Current severity of symptoms is mild. No modifying factors. She denies any chest pain, difficulty breathing, headache, vision change, numbness or weakness. She has otherwise been in her usual state of health.   Past Medical History  Diagnosis Date  . Hypercholesterolemia   . Hypertension   . Diverticulitis   . Gastric ulcer   . Arthritis   . Anemia   . Back injury 1998    T-12 fracture (brace)  . Nodule of left lung   . GERD (gastroesophageal reflux disease)   . History of radiation therapy 12/31/2012-01/10/2013    60 gray to left lower lung  . Peripheral vascular disease 1957/1997    DVT bilateral following fall  . Hx of cancer of lung 2014    tx with radiation only  . Numbness     left lower leg since left total knee nerve damage  . History of kidney stones     x1 attack many yrs ago  . Difficulty sleeping   . Cancer 2014    adenocarcinoma/  lung  . Diabetes mellitus without complication     type 2-metformin    Patient Active Problem List   Diagnosis Date Noted  . 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 Insomnia 04/26/2013  . Left foot drop 04/24/2013  . Postoperative anemia due to acute blood loss 04/23/2013  .  Hyponatremia 04/23/2013  . Postop Transfusion 04/23/2013  . OA (osteoarthritis) of knee 04/22/2013  . Lung cancer, lower lobe 12/17/2012  . CERUMEN IMPACTION, BILATERAL 12/20/2007    Past Surgical History  Procedure Laterality Date  . Abdominal hysterectomy  1979  . Hernia repair Right 1972  . Eye surgery Bilateral 2007, 2008    cataracts  . Appendectomy  1949  . Breast biopsy Bilateral [id number]  . Carpal tunnel release Bilateral 1997  . Skin graft  1955    right groin  . Cholecystectomy    . Video bronchoscopy with endobronchial navigation N/A 11/22/2012    Procedure: VIDEO BRONCHOSCOPY WITH ENDOBRONCHIAL NAVIGATION;  Surgeon: Melrose Nakayama, MD;  Location: [facility];  Service: Thoracic;  Laterality: N/A;  . Total knee arthroplasty Left 04/22/2013    Procedure: LEFT TOTAL KNEE ARTHROPLASTY;  Surgeon: Gearlean Alf, MD;  Location: [facility];  Service: Orthopedics;  Laterality: Left;  . Superficial peroneal nerve release Left 07/19/2013    Procedure: LEFT PERONEAL NERVE DECOMPRESSION ;  Surgeon: Gearlean Alf, MD;  Location: [facility];  Service: Orthopedics;  Laterality: Left;  . Total knee arthroplasty Right 10/06/2014    Procedure: RIGHT TOTAL KNEE ARTHROPLASTY;  Surgeon: Gearlean Alf, MD;  Location: [facility];  Service: Orthopedics;  Laterality: Right;    Current Outpatient Rx  Name  Route  Sig  Dispense  Refill  . acetaminophen (TYLENOL) 500  MG tablet   Oral   Take 1,000 mg by mouth at bedtime as needed for mild pain.         [name] aspirin EC 81 MG tablet   Oral   Take 81 mg by mouth.         . bisacodyl (DULCOLAX) 10 MG suppository   Rectal   Place 1 suppository (10 mg total) rectally daily as needed for moderate constipation.   12 suppository   0   . cyanocobalamin (,VITAMIN B-12,) 1000 MCG/ML injection      1,000 mcg.         [name] dexamethasone (DECADRON) 4 MG tablet      4 mg po bid the day before, day of and day after chemo   40 tablet   1   .  DHA-EPA-VITAMIN E PO   Oral   Take by mouth.         . dicyclomine (BENTYL) 20 MG tablet   Oral   Take 20 mg by mouth every 6 (six) hours as needed (for stomach).         . docusate sodium (COLACE) 100 MG capsule   Oral   Take 1 capsule (100 mg total) by mouth 2 (two) times daily.   10 capsule   0   . folic acid (FOLVITE) 1 MG tablet   Oral   Take 1 tablet (1 mg total) by mouth daily.   30 tablet   4   . glimepiride (AMARYL) 2 MG tablet   Oral   Take 2 mg by mouth daily with breakfast.         . glucose blood (ONE TOUCH ULTRA TEST) test strip      Use 2 (two) times daily. Use as instructed.         [name] HUMALOG KWIKPEN 100 UNIT/ML KiwkPen      See admin instructions.      0     Dispense as written.   . menthol-cetylpyridinium (CEPACOL) 3 MG lozenge   Oral   Take 1 lozenge by mouth as needed for sore throat.         . metFORMIN (GLUCOPHAGE) 500 MG tablet   Oral   Take 500-1,000 mg by mouth 3 (three) times daily. Take 2 tablets (1000 mg) every morning, 1 tablet (500 mg) at noon, 1 tablet (500 mg) every evening         . Multiple Vitamins-Minerals (PRESERVISION AREDS) TABS   Oral   Take 1 each by mouth.         . ondansetron (ZOFRAN) 4 MG tablet   Oral   Take 1 tablet (4 mg total) by mouth every 6 (six) hours as needed for nausea.   40 tablet   0   . pantoprazole (PROTONIX) 40 MG tablet   Oral   Take 40 mg by mouth daily.         . pioglitazone (ACTOS) 15 MG tablet   Oral   Take 15 mg by mouth daily with breakfast.          . polyethylene glycol (MIRALAX / GLYCOLAX) packet   Oral   Take 17 g by mouth daily as needed (constipation).         . pravastatin (PRAVACHOL) 20 MG tablet   Oral   Take 20 mg by mouth at bedtime.         . prochlorperazine (COMPAZINE) 10 MG tablet   Oral   Take 1 tablet (10  mg total) by mouth every 6 (six) hours as needed for nausea or vomiting.   30 tablet   0   . sitaGLIPtin (JANUVIA) 100 MG tablet    Oral   Take 100 mg by mouth daily with breakfast.          . temazepam (RESTORIL) 30 MG capsule   Oral   Take 1 capsule (30 mg total) by mouth at bedtime as needed for sleep.   30 capsule   0   . valsartan-hydrochlorothiazide (DIOVAN HCT) 160-25 MG per tablet   Oral   Take 1 tablet by mouth every morning.           Allergies Actos and Fosamax  Family History  Problem Relation Age of Onset  . Hypertension Mother   . Stroke Mother   . Hypertension Father   . Heart disease Father   . Hypertension Brother     Social History Social History  Substance Use Topics  . Smoking status: Never Smoker   . Smokeless tobacco: Never Used  . Alcohol Use: No    Review of Systems Constitutional: No fever/chills Eyes: No visual changes. ENT: No sore throat. Cardiovascular: Denies chest pain. Respiratory: Denies shortness of breath. Gastrointestinal: No abdominal pain.  No nausea, no vomiting.  No diarrhea.  No constipation. Genitourinary: Negative for dysuria. Musculoskeletal: Negative for back pain. Skin: Negative for rash. Neurological: Negative for headaches, focal weakness or numbness.  10-point ROS otherwise negative.  ____________________________________________   PHYSICAL EXAM:  Filed Vitals:   05/12/15 2125 05/12/15 2130 05/12/15 2226  BP: 173/70 157/59   Pulse: 94 90 90  Temp:   98.1 F (36.7 C)  TempSrc:   Oral  Resp:   16  SpO2: 97% 98% 98%    VITAL SIGNS: ED Triage Vitals  Enc Vitals Group     BP --      Pulse --      Resp --      Temp --      Temp src --      SpO2 --      Weight --      Height --      Head Cir --      Peak Flow --      Pain Score --      Pain Loc --      Pain Edu? --      Excl. in [place]? --     Constitutional: Alert and oriented. Well appearing and in no acute distress. Eyes: Conjunctivae are normal. PERRL. EOMI. Head: Atraumatic. Nose: mild amount of bleeding from the left anterior naris. Mouth/Throat: Mucous membranes  are moist.  No blood in the posterior oropharynx. Neck: No stridor.  Cardiovascular: Normal rate, regular rhythm. Grossly normal heart sounds.  Good peripheral circulation. Respiratory: Normal respiratory effort.  No retractions. Lungs CTAB. Gastrointestinal: Soft and nontender. No distention. No abdominal bruits. No CVA tenderness. Genitourinary: deferred Musculoskeletal: No lower extremity tenderness nor edema.   Neurologic:  Normal speech and language. No gross focal neurologic deficits are appreciated. Skin:  Skin is warm, dry and intact. No rash noted. Psychiatric: Mood and affect are normal. Speech and behavior are normal.  ____________________________________________   LABS (all labs ordered are listed, but only abnormal results are displayed)  Labs Reviewed  CBC WITH DIFFERENTIAL/PLATELET - Abnormal; Notable for the following:    WBC 1.8 (*)    RBC 2.71 (*)    Hemoglobin 7.9 (*)    HCT 24.5 (*)  Platelets 84 (*)    Neutro Abs 1.3 (*)    Lymphs Abs 0.3 (*)    All other components within normal limits  BASIC METABOLIC PANEL - Abnormal; Notable for the following:    Glucose, Bld 186 (*)    BUN 35 (*)    Creatinine, Ser 1.18 (*)    Calcium 8.5 (*)    GFR calc non Af Amer 41 (*)    GFR calc Af Amer 48 (*)    All other components within normal limits  APTT  PROTIME-INR  PREPARE RBC (CROSSMATCH)   ____________________________________________  EKG  none ____________________________________________  RADIOLOGY  none ____________________________________________   PROCEDURES  Procedure(s) performed: None  Critical Care performed: No  ____________________________________________   INITIAL IMPRESSION / ASSESSMENT AND PLAN / ED COURSE  Pertinent labs & imaging results that were available during my care of the patient were reviewed by me and considered in my medical decision making (see chart for details).  RMONI KEPLINGER is a 81 y.o. female with  hypertension, hypercholesterolemia, GERD, lung cancer on chemotherapy who presents for evaluation of leftsided epistaxis. She is not chronically anticoagulated. On arrival to the emergency department she had a very small amount of bleeding from the left anterior naris. No blood in the oropharynx. She was also severely hypertensive with a systolic blood pressure in the 220s. She received 0.1 mg of clonidine with improvement in her blood pressure. She has no symptoms which would be concerning for hypertensive emergency/urgency. Bleeding the left naris resolved with pressure and Afrin. Currently she feels well. Suspect resolved left-sided anterior epistaxis. Basic labs pending then anticipate discharge home.  ----------------------------------------- 11:13 PM on 05/12/2015 ----------------------------------------- Blood pressure improved significantly at this time.Labs notable for chronic leukopenia she also has chronic anemia which is slightly worse when compared to prior. Hemoglobin today is 7.9. Baseline hemoglobin appears to be 9 - 10. I suspect this is secondary to nosebleed as she did bleed for 2 hours prior to arrival. Currently no active bleeding however given significant decrease in hemoglobin, I have consented her for blood. We'll transfuse one unit. Case discussed with hospitalist for admission.  ____________________________________________   FINAL CLINICAL IMPRESSION(S) / ED DIAGNOSES  Final diagnoses:  Anterior epistaxis      Joanne Gavel, MD 05/12/15 2328

## 2015-05-13 ENCOUNTER — Telehealth: Payer: Self-pay | Admitting: Medical Oncology

## 2015-05-13 ENCOUNTER — Other Ambulatory Visit (HOSPITAL_COMMUNITY): Payer: Self-pay | Admitting: Internal Medicine

## 2015-05-13 DIAGNOSIS — Z8719 Personal history of other diseases of the digestive system: Secondary | ICD-10-CM

## 2015-05-13 DIAGNOSIS — C3412 Malignant neoplasm of upper lobe, left bronchus or lung: Secondary | ICD-10-CM | POA: Diagnosis not present

## 2015-05-13 DIAGNOSIS — D696 Thrombocytopenia, unspecified: Secondary | ICD-10-CM

## 2015-05-13 DIAGNOSIS — Z87442 Personal history of urinary calculi: Secondary | ICD-10-CM

## 2015-05-13 DIAGNOSIS — Z8711 Personal history of peptic ulcer disease: Secondary | ICD-10-CM

## 2015-05-13 DIAGNOSIS — R040 Epistaxis: Secondary | ICD-10-CM | POA: Diagnosis not present

## 2015-05-13 DIAGNOSIS — D649 Anemia, unspecified: Secondary | ICD-10-CM

## 2015-05-13 DIAGNOSIS — M129 Arthropathy, unspecified: Secondary | ICD-10-CM

## 2015-05-13 DIAGNOSIS — I739 Peripheral vascular disease, unspecified: Secondary | ICD-10-CM

## 2015-05-13 DIAGNOSIS — Z923 Personal history of irradiation: Secondary | ICD-10-CM

## 2015-05-13 DIAGNOSIS — I10 Essential (primary) hypertension: Secondary | ICD-10-CM

## 2015-05-13 DIAGNOSIS — E119 Type 2 diabetes mellitus without complications: Secondary | ICD-10-CM

## 2015-05-13 DIAGNOSIS — K219 Gastro-esophageal reflux disease without esophagitis: Secondary | ICD-10-CM

## 2015-05-13 DIAGNOSIS — D709 Neutropenia, unspecified: Secondary | ICD-10-CM | POA: Diagnosis not present

## 2015-05-13 DIAGNOSIS — R5383 Other fatigue: Secondary | ICD-10-CM

## 2015-05-13 DIAGNOSIS — R531 Weakness: Secondary | ICD-10-CM

## 2015-05-13 DIAGNOSIS — E780 Pure hypercholesterolemia: Secondary | ICD-10-CM

## 2015-05-13 DIAGNOSIS — Z86718 Personal history of other venous thrombosis and embolism: Secondary | ICD-10-CM

## 2015-05-13 DIAGNOSIS — R200 Anesthesia of skin: Secondary | ICD-10-CM

## 2015-05-13 DIAGNOSIS — Z8781 Personal history of (healed) traumatic fracture: Secondary | ICD-10-CM

## 2015-05-13 LAB — GLUCOSE, CAPILLARY
Glucose-Capillary: 154 mg/dL — ABNORMAL HIGH (ref 65–99)
Glucose-Capillary: 128 mg/dL — ABNORMAL HIGH (ref 65–99)
Glucose-Capillary: 122 mg/dL — ABNORMAL HIGH (ref 65–99)

## 2015-05-13 LAB — HEMOGLOBIN A1C: Hgb A1c MFr Bld: 7.6 % — ABNORMAL HIGH (ref 4.0–6.0)

## 2015-05-13 LAB — ABO/RH: ABO/RH(D): O POS

## 2015-05-13 LAB — PREPARE RBC (CROSSMATCH)

## 2015-05-13 MED ORDER — PROCHLORPERAZINE MALEATE 10 MG PO TABS: 10.0000 mg | ORAL_TABLET | Freq: Four times a day (QID) | ORAL | Status: DC | PRN

## 2015-05-13 MED ORDER — FOLIC ACID 1 MG PO TABS
1.0000 mg | ORAL_TABLET | Freq: Every day | ORAL | Status: DC
  Administered 2015-05-13: 09:00:00 1 mg via ORAL
  Filled 2015-05-13: qty 1

## 2015-05-13 MED ORDER — POLYETHYLENE GLYCOL 3350 17 G PO PACK: 17.0000 g | PACK | Freq: Every day | ORAL | Status: DC | PRN

## 2015-05-13 MED ORDER — INSULIN ASPART 100 UNIT/ML ~~LOC~~ SOLN: 0.0000 [IU] | Freq: Every day | SUBCUTANEOUS | Status: DC

## 2015-05-13 MED ORDER — MENTHOL 3 MG MT LOZG: 1.0000 | LOZENGE | OROMUCOSAL | Status: DC | PRN

## 2015-05-13 MED ORDER — PRAVASTATIN SODIUM 20 MG PO TABS: 20.0000 mg | ORAL_TABLET | Freq: Every day | ORAL | Status: DC

## 2015-05-13 MED ORDER — OCUVITE PO TABS
1.0000 | ORAL_TABLET | Freq: Every day | ORAL | Status: DC
  Administered 2015-05-13: 09:00:00 1 via ORAL
  Filled 2015-05-13: qty 1

## 2015-05-13 MED ORDER — DOCUSATE SODIUM 100 MG PO CAPS: 100.0000 mg | ORAL_CAPSULE | Freq: Two times a day (BID) | ORAL | Status: DC

## 2015-05-13 MED ORDER — DICYCLOMINE HCL 20 MG PO TABS
20.0000 mg | ORAL_TABLET | Freq: Four times a day (QID) | ORAL | Status: DC | PRN
Start: 2015-05-13 — End: 2015-05-13
  Filled 2015-05-13: qty 1

## 2015-05-13 MED ORDER — HYDROCHLOROTHIAZIDE 25 MG PO TABS
25.0000 mg | ORAL_TABLET | Freq: Every day | ORAL | Status: DC
  Administered 2015-05-13: 25 mg via ORAL
  Filled 2015-05-13: qty 1

## 2015-05-13 MED ORDER — IRBESARTAN 150 MG PO TABS
150.0000 mg | ORAL_TABLET | Freq: Every day | ORAL | Status: DC
Start: 2015-05-13 — End: 2015-05-13
  Administered 2015-05-13: 150 mg via ORAL
  Filled 2015-05-13: qty 1

## 2015-05-13 MED ORDER — ONDANSETRON HCL 4 MG PO TABS: 4.0000 mg | ORAL_TABLET | Freq: Four times a day (QID) | ORAL | Status: DC | PRN

## 2015-05-13 MED ORDER — LINAGLIPTIN 5 MG PO TABS
5.0000 mg | ORAL_TABLET | Freq: Every day | ORAL | Status: DC
  Administered 2015-05-13: 5 mg via ORAL
  Filled 2015-05-13: qty 1

## 2015-05-13 MED ORDER — VALSARTAN-HYDROCHLOROTHIAZIDE 160-25 MG PO TABS: 1.0000 | ORAL_TABLET | Freq: Every morning | ORAL | Status: DC

## 2015-05-13 MED ORDER — MORPHINE SULFATE (PF) 2 MG/ML IV SOLN: 1.0000 mg | INTRAVENOUS | Status: DC | PRN

## 2015-05-13 MED ORDER — DEXAMETHASONE 4 MG PO TABS: 4.0000 mg | ORAL_TABLET | Freq: Every day | ORAL | Status: DC

## 2015-05-13 MED ORDER — TEMAZEPAM 15 MG PO CAPS: 30.0000 mg | ORAL_CAPSULE | Freq: Every evening | ORAL | Status: DC | PRN

## 2015-05-13 MED ORDER — INSULIN ASPART 100 UNIT/ML ~~LOC~~ SOLN
0.0000 [IU] | Freq: Three times a day (TID) | SUBCUTANEOUS | Status: DC
  Administered 2015-05-13 (×2): 1 [IU] via SUBCUTANEOUS
  Administered 2015-05-13: 09:00:00 2 [IU] via SUBCUTANEOUS
  Filled 2015-05-13 (×2): qty 1
  Filled 2015-05-13: qty 2

## 2015-05-13 MED ORDER — PANTOPRAZOLE SODIUM 40 MG PO TBEC
40.0000 mg | DELAYED_RELEASE_TABLET | Freq: Every day | ORAL | Status: DC
  Administered 2015-05-13: 09:00:00 40 mg via ORAL
  Filled 2015-05-13: qty 1

## 2015-05-13 NOTE — Progress Notes (Signed)
Subjective: No further nose bleeds overnight.  Objective: Vital signs in last 24 hours: Temp:  [97.6 F (36.4 C)-98.1 F (36.7 C)] 97.6 F (36.4 C) (08/31 0553) Pulse Rate:  [51-94] 54 (08/31 0553) Resp:  [15-20] 20 (08/31 0553) BP: (134-173)/(35-70) 152/48 mmHg (08/31 0553) SpO2:  [96 %-98 %] 97 % (08/31 0553) Weight:  [65.363 kg (144 lb 1.6 oz)] 65.363 kg (144 lb 1.6 oz) (08/31 0158) Weight change:  Last BM Date: 05/12/15  Intake/Output from previous day: 08/30 0701 - 08/31 0700 In: 395 [Blood:395] Out: 1300 [Urine:1300] Intake/Output this shift: Total I/O In: -  Out: 750 [Urine:750]  General: alert, oriented x 3, NAD HENT: no JVD, no pallor, no icterus. No active nasal bleeding. Chest: clear to auscultation CVS: RRR, S1S2, no  tachycardia Abdomen: soft, nontender, no hepatosplenomegaly Ext: no lower leg edema.  Neuro: moving all extremities  Lab Results:  Recent Labs  05/12/15 2201  WBC 1.8*  HGB 7.9*  HCT 24.5*  PLT 84*   BMET  Recent Labs  05/12/15 2201  NA 135  K 4.7  CL 105  CO2 24  GLUCOSE 186*  BUN 35*  CREATININE 1.18*  CALCIUM 8.5*    Studies/Results: No results found.  Medications:  Scheduled Meds: . beta carotene w/minerals  1 tablet Oral Daily  . dexamethasone  4 mg Oral Daily  . docusate sodium  100 mg Oral BID  . folic acid  1 mg Oral Daily  . irbesartan  150 mg Oral Daily   And  . hydrochlorothiazide  25 mg Oral Daily  . insulin aspart  0-5 Units Subcutaneous QHS  . insulin aspart  0-9 Units Subcutaneous TID WC  . linagliptin  5 mg Oral Daily  . pantoprazole  40 mg Oral QAC breakfast  . pravastatin  20 mg Oral QHS   Continuous Infusions:  PRN Meds:.dicyclomine, menthol-cetylpyridinium, morphine injection, ondansetron, polyethylene glycol, prochlorperazine, temazepam   Assessment/Plan: 81 year old lady with history of lung cancer on chemotherapy, presented with nosebleed for 2 hours.  Anterior epistaxis was controlled  in the emergency room.  She received 1 unit of packed RBCs for initial hemoglobin of 7.9 on presentation.  Patient was admitted for observation.  No active bleeding overnight.  Await Oncology evaluation.  Possible discharge later today if patient is cleared by Oncology.  Hypertension: continue Irbesartan and hydrochlorothiazide.  Diabetes: continue sliding scale insulin coverage.  Hyperlipidemia: continue pravastatin.  GI prophylaxis with pantoprazole.    Stephanie Fletcher 05/13/2015, 9:26 AM

## 2015-05-13 NOTE — Progress Notes (Signed)
Pt home medications that were in purse, Cyanocobalamin & Nasal decongestant spray sent to pharmacy to be stored until discharged.

## 2015-05-13 NOTE — Discharge Summary (Signed)
Physician Discharge Summary  Patient ID: Stephanie Fletcher MRN: [id number] DOB/AGE: [id number] 79 y.o.  Admit date: 05/12/2015 Discharge date: 05/13/2015  Admission Diagnoses: epistaxis, lung cancer  Discharge Diagnoses:  Active Problems:   Epistaxis Lung cancer on chemotherapy Chronic medical problems: Hypertension, hyperlipidemia, diabetes.  Discharged Condition: stable  Hospital Course: 81 year old lady with history of lung cancer on chemotherapy, presented with nosebleed for 2 hours. Anterior epistaxis was controlled in the emergency room. She received 1 unit of packed RBCs for initial hemoglobin of 7.9 on presentation. Patient was monitored on the medical floor overnight. No active bleeding overnight.   Patient was evaluated by Oncology and cleared for discharge.  Shall discharge patient today.   Advised to resume her usual medications.  She will follow up with her oncologist in [place] next week for chemotherapy.  Consults: hematology/oncology  Discharge Exam: Blood pressure 152/48, pulse 54, temperature 97.6 F (36.4 C), temperature source Oral, resp. rate 20, height '5\' 3"'$  (1.6 m), weight 65.363 kg (144 lb 1.6 oz), SpO2 97 %. General: alert, oriented x 3, NAD HENT: no active nasal bleeding, no JVD, no icterus Chest: clear to auscultation CVS: RRR, S1S2, no tachycardia Abdomen: soft, nontender, no hepatosplenomegaly Ext: no lower leg edema.  Neuro: moving all extremities.  Disposition: 03-Skilled Nursing Facility     Medication List    TAKE these medications        acetaminophen 500 MG tablet  Commonly known as:  TYLENOL  Take 1,000 mg by mouth at bedtime as needed for mild pain.     aspirin EC 81 MG tablet  Take 81 mg by mouth.     bisacodyl 10 MG suppository  Commonly known as:  DULCOLAX  Place 1 suppository (10 mg total) rectally daily as needed for moderate constipation.     cyanocobalamin 1000 MCG/ML injection  Commonly known as:  (VITAMIN B-12)   1,000 mcg.     dexamethasone 4 MG tablet  Commonly known as:  DECADRON  4 mg po bid the day before, day of and day after chemo     DHA-EPA-VITAMIN E PO  Take by mouth.     dicyclomine 20 MG tablet  Commonly known as:  BENTYL  Take 20 mg by mouth every 6 (six) hours as needed (for stomach).     DIOVAN HCT 160-25 MG per tablet  Generic drug:  valsartan-hydrochlorothiazide  Take 1 tablet by mouth every morning.     docusate sodium 100 MG capsule  Commonly known as:  COLACE  Take 1 capsule (100 mg total) by mouth 2 (two) times daily.     folic acid 1 MG tablet  Commonly known as:  FOLVITE  Take 1 tablet (1 mg total) by mouth daily.     glimepiride 2 MG tablet  Commonly known as:  AMARYL  Take 2 mg by mouth daily with breakfast.     HUMALOG KWIKPEN 100 UNIT/ML KiwkPen  Generic drug:  insulin lispro  See admin instructions.     menthol-cetylpyridinium 3 MG lozenge  Commonly known as:  CEPACOL  Take 1 lozenge by mouth as needed for sore throat.     metFORMIN 500 MG tablet  Commonly known as:  GLUCOPHAGE  Take 500-1,000 mg by mouth 3 (three) times daily. Take 2 tablets (1000 mg) every morning, 1 tablet (500 mg) at noon, 1 tablet (500 mg) every evening     ondansetron 4 MG tablet  Commonly known as:  ZOFRAN  Take 1 tablet (4 mg total)  by mouth every 6 (six) hours as needed for nausea.     ONE TOUCH ULTRA TEST test strip  Generic drug:  glucose blood  Use 2 (two) times daily. Use as instructed.     pantoprazole 40 MG tablet  Commonly known as:  PROTONIX  Take 40 mg by mouth daily.     pioglitazone 15 MG tablet  Commonly known as:  ACTOS  Take 15 mg by mouth daily with breakfast.     polyethylene glycol packet  Commonly known as:  MIRALAX / GLYCOLAX  Take 17 g by mouth daily as needed (constipation).     pravastatin 20 MG tablet  Commonly known as:  PRAVACHOL  Take 20 mg by mouth at bedtime.     PRESERVISION AREDS Tabs  Take 1 each by mouth.      prochlorperazine 10 MG tablet  Commonly known as:  COMPAZINE  Take 1 tablet (10 mg total) by mouth every 6 (six) hours as needed for nausea or vomiting.     sitaGLIPtin 100 MG tablet  Commonly known as:  JANUVIA  Take 100 mg by mouth daily with breakfast.     temazepam 30 MG capsule  Commonly known as:  RESTORIL  Take 1 capsule (30 mg total) by mouth at bedtime as needed for sleep.         Signed: [name] 05/13/2015, 2:22 PM

## 2015-05-13 NOTE — Consult Note (Signed)
[place] @ [facility] [phone]  Fax:([phone] [place]: August 15, 1929  MR#: [id number]  JYN#:[id number]  Patient Care Team: Glendon Axe, MD as PCP - General (Internal Medicine) Melrose Nakayama, MD as Attending Physician (Cardiothoracic Surgery) Curt Bears, MD (Hematology and Oncology)  CHIEF COMPLAINT:  Chief Complaint  Patient presents with  . Epistaxis    VISIT DIAGNOSIS:     ICD-9-CM ICD-10-CM   1. Anterior epistaxis 784.7 R04.0   2. Anemia, unspecified anemia type 285.9 D64.9       No history exists.    Oncology Flowsheet 07/19/2013 07/20/2013 10/06/2014 10/07/2014 03/30/2015 04/09/2015 04/30/2015  Day, Cycle - - - - - Day 1, Cycle 1 Day 1, Cycle 2  CARBOplatin (PARAPLATIN) IV - - - - - 310 mg 350 mg  cyanocobalamin ((VITAMIN B-12)) IM - - - - 1,000 mcg - -  dexamethasone (DECADRON) IV 10 mg - - 10 mg - [ 20 mg ] [ 20 mg ]  dexamethasone (DECADRON) PO - - - - - - -  enoxaparin (LOVENOX) [facility] - 40 mg - - - - -  metoCLOPramide (REGLAN) IV - - - - - - -  metoCLOPramide (REGLAN) PO - - - 10 mg - - -  ondansetron (ZOFRAN) IJ - - - - - - -  ondansetron (ZOFRAN) IV - - - 4 mg - [ 16 mg ] [ 16 mg ]  ondansetron (ZOFRAN) PO - - - - - - -  PEMEtrexed (ALIMTA) IV - - - - - 400 mg/m2 400 mg/m2    INTERVAL HISTORY: 81 year old lady who had a history of non-small cell carcinoma of lung.  I did not have detailed history available but patient was diagnosed to have left upper lobe mass underwent SBRT last year.  Recently he has been diagnosed to have metastases and recurrent disease patient was started on carboplatinum and Taxol.  According to patient patient had chemotherapy 2 weeks ago.  Patient was admitted in the hospital with epistaxis.  Most of her care is being provided in [facility].  Patient was found to be neutropenic and thrombocytopenic. Epistaxis is resolved patient received 1 unit of packed cell transfusion I was  asked to evaluate patient patient.  Patient is afebrile Neutrophil count was 1.3  REVIEW OF SYSTEMS:    general status: Patient is feeling weak and tired.  No change in a performance status.  No chills.  No fever. HEENT  No evidence of stomatitis patient had epistaxis which is resolved Lungs: No cough or shortness of breath Cardiac: No chest pain or paroxysmal nocturnal dyspnea GI: No nausea no vomiting no diarrhea no abdominal pain Skin: No rash Lower extremity no swelling Neurological system: No tingling.  No numbness.  No other focal signs Musculoskeletal system no bony pains  Patient is due for chemotherapy in one week.  Being followed very closely with every week blood count.   As per HPI. Otherwise, a complete review of systems is negatve.  PAST MEDICAL HISTORY: Past Medical History  Diagnosis Date  . Hypercholesterolemia   . Hypertension   . Diverticulitis   . Gastric ulcer   . Arthritis   . Anemia   . Back injury 1998    T-12 fracture (brace)  . Nodule of left lung   . GERD (gastroesophageal reflux disease)   . History of radiation therapy 12/31/2012-01/10/2013    60 gray to left lower lung  . Peripheral vascular disease 1957/1997  DVT bilateral following fall  . Hx of cancer of lung 2014    tx with radiation only  . Numbness     left lower leg since left total knee nerve damage  . History of kidney stones     x1 attack many yrs ago  . Difficulty sleeping   . Cancer 2014    adenocarcinoma/  lung  . Diabetes mellitus without complication     type 2-metformin    PAST SURGICAL HISTORY: Past Surgical History  Procedure Laterality Date  . Abdominal hysterectomy  1979  . Hernia repair Right 1972  . Eye surgery Bilateral 2007, 2008    cataracts  . Appendectomy  1949  . Breast biopsy Bilateral [id number]  . Carpal tunnel release Bilateral 1997  . Skin graft  1955    right groin  . Cholecystectomy    . Video bronchoscopy with endobronchial navigation N/A  11/22/2012    Procedure: VIDEO BRONCHOSCOPY WITH ENDOBRONCHIAL NAVIGATION;  Surgeon: Melrose Nakayama, MD;  Location: [facility];  Service: Thoracic;  Laterality: N/A;  . Total knee arthroplasty Left 04/22/2013    Procedure: LEFT TOTAL KNEE ARTHROPLASTY;  Surgeon: Gearlean Alf, MD;  Location: [facility];  Service: Orthopedics;  Laterality: Left;  . Superficial peroneal nerve release Left 07/19/2013    Procedure: LEFT PERONEAL NERVE DECOMPRESSION ;  Surgeon: Gearlean Alf, MD;  Location: [facility];  Service: Orthopedics;  Laterality: Left;  . Total knee arthroplasty Right 10/06/2014    Procedure: RIGHT TOTAL KNEE ARTHROPLASTY;  Surgeon: Gearlean Alf, MD;  Location: [facility];  Service: Orthopedics;  Laterality: Right;    FAMILY HISTORY Family History  Problem Relation Age of Onset  . Hypertension Mother   . Stroke Mother   . Hypertension Father   . Heart disease Father   . Hypertension Brother     GYNECOLOGIC HISTORY:  No LMP recorded. Patient has had a hysterectomy.     ADVANCED DIRECTIVES:    HEALTH MAINTENANCE: Social History  Substance Use Topics  . Smoking status: Never Smoker   . Smokeless tobacco: Never Used  . Alcohol Use: No     Colonoscopy:  PAP:  Bone density:  Lipid panel:  Allergies  Allergen Reactions  . Actos [Pioglitazone] Other (See Comments)    Rash with 30 mg tablet- tolerating 56m  . Fosamax [Alendronate] Rash    Current Facility-Administered Medications  Medication Dose Route Frequency Provider Last Rate Last Dose  . beta carotene w/minerals (OCUVITE) tablet 1 tablet  1 tablet Oral Daily MHarrie Foreman MD   1 tablet at 05/13/15 0831  . dicyclomine (BENTYL) tablet 20 mg  20 mg Oral Q6H PRN MHarrie Foreman MD      . docusate sodium (COLACE) capsule 100 mg  100 mg Oral BID MHarrie Foreman MD   100 mg at 05/13/15 0215  . folic acid (FOLVITE) tablet 1 mg  1 mg Oral Daily MHarrie Foreman MD   1 mg at 05/13/15 0830  . irbesartan (AVAPRO) tablet  150 mg  150 mg Oral Daily MHarrie Foreman MD   150 mg at 05/13/15 0830   And  . hydrochlorothiazide (HYDRODIURIL) tablet 25 mg  25 mg Oral Daily MHarrie Foreman MD   25 mg at 05/13/15 [phone] . insulin aspart (novoLOG) injection 0-5 Units  0-5 Units Subcutaneous QHS MHarrie Foreman MD      . insulin aspart (novoLOG) injection 0-9 Units  0-9 Units Subcutaneous TID WC  Harrie Foreman, MD   1 Units at 05/13/15 1647  . linagliptin (TRADJENTA) tablet 5 mg  5 mg Oral Daily Harrie Foreman, MD   5 mg at 05/13/15 0831  . menthol-cetylpyridinium (CEPACOL) lozenge 3 mg  1 lozenge Oral PRN Harrie Foreman, MD      . morphine 2 MG/ML injection 1 mg  1 mg Intravenous Q4H PRN Harrie Foreman, MD      . ondansetron [facility]) tablet 4 mg  4 mg Oral Q6H PRN Harrie Foreman, MD      . pantoprazole (PROTONIX) EC tablet 40 mg  40 mg Oral QAC breakfast Harrie Foreman, MD   40 mg at 05/13/15 0831  . polyethylene glycol (MIRALAX / GLYCOLAX) packet 17 g  17 g Oral Daily PRN Harrie Foreman, MD      . pravastatin (PRAVACHOL) tablet 20 mg  20 mg Oral QHS Harrie Foreman, MD      . prochlorperazine (COMPAZINE) tablet 10 mg  10 mg Oral Q6H PRN Harrie Foreman, MD      . temazepam (RESTORIL) capsule 30 mg  30 mg Oral QHS PRN Harrie Foreman, MD        OBJECTIVE: PHYSICAL EXAM: General  status: Patient is alert oriented lying in the bed not any acute distress. Lungs: Emphysematous chest.  Diminished air entry on both sides. HEENT: No evidence of bleeding at present time. Cardiac: Normal heart sounds.  Tachycardia. ABDOMEN : Soft.  Liver and spleen not palpable Lower extremity no edema. Neurological system : No abnormality detected. HEENT: No ecchymosis or petechial hemorrhages  Filed Vitals:   05/13/15 0553  BP: 152/48  Pulse: 54  Temp: 97.6 F (36.4 C)  Resp: 20     Body mass index is 25.53 kg/(m^2).    ECOG FS:1 - Symptomatic but completely ambulatory  LAB RESULTS:  Admission on  05/12/2015  Component Date Value Ref Range Status  . WBC 05/12/2015 1.8* 3.6 - 11.0 K/uL Final  . RBC 05/12/2015 2.71* 3.80 - 5.20 MIL/uL Final  . Hemoglobin 05/12/2015 7.9* 12.0 - 16.0 g/dL Final  . HCT 05/12/2015 24.5* 35.0 - 47.0 % Final  . MCV 05/12/2015 90.5  80.0 - 100.0 fL Final  . MCH 05/12/2015 29.2  26.0 - 34.0 pg Final  . MCHC 05/12/2015 32.3  32.0 - 36.0 g/dL Final  . RDW 05/12/2015 13.3  11.5 - 14.5 % Final  . Platelets 05/12/2015 84* 150 - 440 K/uL Final  . Neutrophils Relative % 05/12/2015 71%   Final  . Neutro Abs 05/12/2015 1.3* 1.4 - 6.5 K/uL Final  . Lymphocytes Relative 05/12/2015 14%   Final  . Lymphs Abs 05/12/2015 0.3* 1.0 - 3.6 K/uL Final  . Monocytes Relative 05/12/2015 13%   Final  . Monocytes Absolute 05/12/2015 0.2  0.2 - 0.9 K/uL Final  . Eosinophils Relative 05/12/2015 2%   Final  . Eosinophils Absolute 05/12/2015 0.0  0 - 0.7 K/uL Final  . Basophils Relative 05/12/2015 0%   Final  . Basophils Absolute 05/12/2015 0.0  0 - 0.1 K/uL Final  . Sodium 05/12/2015 135  135 - 145 mmol/L Final  . Potassium 05/12/2015 4.7  3.5 - 5.1 mmol/L Final  . Chloride 05/12/2015 105  101 - 111 mmol/L Final  . CO2 05/12/2015 24  22 - 32 mmol/L Final  . Glucose, Bld 05/12/2015 186* 65 - 99 mg/dL Final  . BUN 05/12/2015 35* 6 - 20 mg/dL Final  .  Creatinine, Ser 05/12/2015 1.18* 0.44 - 1.00 mg/dL Final  . Calcium 05/12/2015 8.5* 8.9 - 10.3 mg/dL Final  . GFR calc non Af Amer 05/12/2015 41* >60 mL/min Final  . GFR calc Af Amer 05/12/2015 48* >60 mL/min Final   Comment: (NOTE) The eGFR has been calculated using the CKD EPI equation. This calculation has not been validated in all clinical situations. eGFR's persistently <60 mL/min signify possible Chronic Kidney Disease.   . Anion gap 05/12/2015 6  5 - 15 Final  . aPTT 05/12/2015 32  24 - 36 seconds Final  . Prothrombin Time 05/12/2015 14.2  11.4 - 15.0 seconds Final  . INR 05/12/2015 1.08   Final  . Order Confirmation  05/13/2015 ORDER PROCESSED BY BLOOD BANK   Final  . ABO/RH(D) 05/13/2015 O POS   Final  . Antibody Screen 05/13/2015 NEG   Final  . Sample Expiration 05/13/2015 05/16/2015   Final  . Unit Number 05/13/2015 [id number]   Final  . Blood Component Type 05/13/2015 RBC, LR IRR   Final  . Unit division 05/13/2015 00   Final  . Status of Unit 05/13/2015 ISSUED   Final  . Transfusion Status 05/13/2015 OK TO TRANSFUSE   Final  . Crossmatch Result 05/13/2015 Compatible   Final  . Hgb A1c MFr Bld 05/12/2015 7.6* 4.0 - 6.0 % Final  . Glucose-Capillary 05/13/2015 154* 65 - 99 mg/dL Final  . Comment 1 05/13/2015 Notify RN   Final  . Glucose-Capillary 05/13/2015 128* 65 - 99 mg/dL Final  . Comment 1 05/13/2015 Notify RN   Final  . Glucose-Capillary 05/13/2015 122* 65 - 99 mg/dL Final  . Comment 1 05/13/2015 Notify RN   Final  Orders Only on 05/13/2015  Component Date Value Ref Range Status  . ABO/RH(D) 05/13/2015 O POS   Final     ASSESSMENT:  Recurrent carcinoma of lung most likely non-small cell cancer I do not have detailed report available.  Patient is closely being monitored by [facility]. 2.  Thrombocytopenia.  Platelet count is 84,000 does not require any platelet transfusion 3.  Neutropenia.  Absolute neutrophil count is 1.3 and patient is afebrile At this point in time patient does not need any active intervention and just be should be followed regularly. Appointment has been made at [facility] for another blood count. Patient did receive 1 unit of packed cell transfusion I left message with primary care physician Dr. Candiss Norse that the patient is stable can be discharged. This and was advised to call me or her primary care physician if spikes any fever up there is any more bleeding Patient's should avoid aspirin or any other blood thinning medication  PLAN:  No problem-specific assessment & plan notes found for this encounter.   Patient expressed  understanding and was in agreement with this plan. She also understands that She can call clinic at any time with any questions, concerns, or complaints.    Lung cancer, lower lobe   Staging form: Lung, AJCC 7th Edition     Clinical stage from 03/02/2015: Stage IV (T2a, N3, M1a) - Signed by Curt Bears, MD on 03/02/2015   Forest Gleason, MD   05/13/2015 5:04 PM

## 2015-05-13 NOTE — Telephone Encounter (Signed)
I left message that pt does not need to come for labs tomorrow since she had labs done at [facility] yesterday and received blood. I told her to keep appt next week for labs.

## 2015-05-13 NOTE — H&P (Addendum)
Stephanie Fletcher is an 81 y.o. female.   Chief Complaint: Nosebleed HPI: Patient with past medical history significant for lung cancer undergoing chemotherapy presents to the emergency department from her assisted living facility complaining of nosebleed that lasted possibly 2 hours. She was found to be anemic with a hemoglobin of 7.8; however, the patient has been asymptomatic. The bleed was found to be anterior but due to the hyperemic and friable nature of bleeding tissue emergency department staff felt the patient would need admission.  Past Medical History  Diagnosis Date  . Hypercholesterolemia   . Hypertension   . Diverticulitis   . Gastric ulcer   . Arthritis   . Anemia   . Back injury 1998    T-12 fracture (brace)  . Nodule of left lung   . GERD (gastroesophageal reflux disease)   . History of radiation therapy 12/31/2012-01/10/2013    60 gray to left lower lung  . Peripheral vascular disease 1957/1997    DVT bilateral following fall  . Hx of cancer of lung 2014    tx with radiation only  . Numbness     left lower leg since left total knee nerve damage  . History of kidney stones     x1 attack many yrs ago  . Difficulty sleeping   . Cancer 2014    adenocarcinoma/  lung  . Diabetes mellitus without complication     type 2-metformin    Past Surgical History  Procedure Laterality Date  . Abdominal hysterectomy  1979  . Hernia repair Right 1972  . Eye surgery Bilateral 2007, 2008    cataracts  . Appendectomy  1949  . Breast biopsy Bilateral [id number]  . Carpal tunnel release Bilateral 1997  . Skin graft  1955    right groin  . Cholecystectomy    . Video bronchoscopy with endobronchial navigation N/A 11/22/2012    Procedure: VIDEO BRONCHOSCOPY WITH ENDOBRONCHIAL NAVIGATION;  Surgeon: Melrose Nakayama, MD;  Location: [facility];  Service: Thoracic;  Laterality: N/A;  . Total knee arthroplasty Left 04/22/2013    Procedure: LEFT TOTAL KNEE ARTHROPLASTY;  Surgeon: Gearlean Alf, MD;  Location: [facility];  Service: Orthopedics;  Laterality: Left;  . Superficial peroneal nerve release Left 07/19/2013    Procedure: LEFT PERONEAL NERVE DECOMPRESSION ;  Surgeon: Gearlean Alf, MD;  Location: [facility];  Service: Orthopedics;  Laterality: Left;  . Total knee arthroplasty Right 10/06/2014    Procedure: RIGHT TOTAL KNEE ARTHROPLASTY;  Surgeon: Gearlean Alf, MD;  Location: [facility];  Service: Orthopedics;  Laterality: Right;    Family History  Problem Relation Age of Onset  . Hypertension Mother   . Stroke Mother   . Hypertension Father   . Heart disease Father   . Hypertension Brother    Social History:  reports that she has never smoked. She has never used smokeless tobacco. She reports that she does not drink alcohol or use illicit drugs.  Allergies:  Allergies  Allergen Reactions  . Actos [Pioglitazone] Other (See Comments)    Rash with 30 mg tablet- tolerating 28m  . Fosamax [Alendronate] Rash    Medications Prior to Admission  Medication Sig Dispense Refill  . acetaminophen (TYLENOL) 500 MG tablet Take 1,000 mg by mouth at bedtime as needed for mild pain.    .Marland Kitchenaspirin EC 81 MG tablet Take 81 mg by mouth.    . bisacodyl (DULCOLAX) 10 MG suppository Place 1 suppository (10 mg total) rectally daily  as needed for moderate constipation. 12 suppository 0  . cyanocobalamin (,VITAMIN B-12,) 1000 MCG/ML injection 1,000 mcg.    [name] dexamethasone (DECADRON) 4 MG tablet 4 mg po bid the day before, day of and day after chemo 40 tablet 1  . DHA-EPA-VITAMIN E PO Take by mouth.    . dicyclomine (BENTYL) 20 MG tablet Take 20 mg by mouth every 6 (six) hours as needed (for stomach).    . docusate sodium (COLACE) 100 MG capsule Take 1 capsule (100 mg total) by mouth 2 (two) times daily. 10 capsule 0  . folic acid (FOLVITE) 1 MG tablet Take 1 tablet (1 mg total) by mouth daily. 30 tablet 4  . glimepiride (AMARYL) 2 MG tablet Take 2 mg by mouth daily with breakfast.    .  glucose blood (ONE TOUCH ULTRA TEST) test strip Use 2 (two) times daily. Use as instructed.    [name] HUMALOG KWIKPEN 100 UNIT/ML KiwkPen See admin instructions.  0  . menthol-cetylpyridinium (CEPACOL) 3 MG lozenge Take 1 lozenge by mouth as needed for sore throat.    . metFORMIN (GLUCOPHAGE) 500 MG tablet Take 500-1,000 mg by mouth 3 (three) times daily. Take 2 tablets (1000 mg) every morning, 1 tablet (500 mg) at noon, 1 tablet (500 mg) every evening    . Multiple Vitamins-Minerals (PRESERVISION AREDS) TABS Take 1 each by mouth.    . ondansetron (ZOFRAN) 4 MG tablet Take 1 tablet (4 mg total) by mouth every 6 (six) hours as needed for nausea. 40 tablet 0  . pantoprazole (PROTONIX) 40 MG tablet Take 40 mg by mouth daily.    . pioglitazone (ACTOS) 15 MG tablet Take 15 mg by mouth daily with breakfast.     . polyethylene glycol (MIRALAX / GLYCOLAX) packet Take 17 g by mouth daily as needed (constipation).    . pravastatin (PRAVACHOL) 20 MG tablet Take 20 mg by mouth at bedtime.    . prochlorperazine (COMPAZINE) 10 MG tablet Take 1 tablet (10 mg total) by mouth every 6 (six) hours as needed for nausea or vomiting. 30 tablet 0  . sitaGLIPtin (JANUVIA) 100 MG tablet Take 100 mg by mouth daily with breakfast.     . temazepam (RESTORIL) 30 MG capsule Take 1 capsule (30 mg total) by mouth at bedtime as needed for sleep. 30 capsule 0  . valsartan-hydrochlorothiazide (DIOVAN HCT) 160-25 MG per tablet Take 1 tablet by mouth every morning.      Results for orders placed or performed during the hospital encounter of 05/12/15 (from the past 48 hour(s))  CBC with Differential     Status: Abnormal   Collection Time: 05/12/15 10:01 PM  Result Value Ref Range   WBC 1.8 (L) 3.6 - 11.0 K/uL   RBC 2.71 (L) 3.80 - 5.20 MIL/uL   Hemoglobin 7.9 (L) 12.0 - 16.0 g/dL   HCT 24.5 (L) 35.0 - 47.0 %   MCV 90.5 80.0 - 100.0 fL   MCH 29.2 26.0 - 34.0 pg   MCHC 32.3 32.0 - 36.0 g/dL   RDW 13.3 11.5 - 14.5 %   Platelets 84  (L) 150 - 440 K/uL   Neutrophils Relative % 71% %   Neutro Abs 1.3 (L) 1.4 - 6.5 K/uL   Lymphocytes Relative 14% %   Lymphs Abs 0.3 (L) 1.0 - 3.6 K/uL   Monocytes Relative 13% %   Monocytes Absolute 0.2 0.2 - 0.9 K/uL   Eosinophils Relative 2% %   Eosinophils Absolute 0.0 0 -  0.7 K/uL   Basophils Relative 0% %   Basophils Absolute 0.0 0 - 0.1 K/uL  Basic metabolic panel     Status: Abnormal   Collection Time: 05/12/15 10:01 PM  Result Value Ref Range   Sodium 135 135 - 145 mmol/L   Potassium 4.7 3.5 - 5.1 mmol/L   Chloride 105 101 - 111 mmol/L   CO2 24 22 - 32 mmol/L   Glucose, Bld 186 (H) 65 - 99 mg/dL   BUN 35 (H) 6 - 20 mg/dL   Creatinine, Ser 1.18 (H) 0.44 - 1.00 mg/dL   Calcium 8.5 (L) 8.9 - 10.3 mg/dL   GFR calc non Af Amer 41 (L) >60 mL/min   GFR calc Af Amer 48 (L) >60 mL/min    Comment: (NOTE) The eGFR has been calculated using the CKD EPI equation. This calculation has not been validated in all clinical situations. eGFR's persistently <60 mL/min signify possible Chronic Kidney Disease.    Anion gap 6 5 - 15  APTT     Status: None   Collection Time: 05/12/15 10:01 PM  Result Value Ref Range   aPTT 32 24 - 36 seconds  Protime-INR     Status: None   Collection Time: 05/12/15 10:01 PM  Result Value Ref Range   Prothrombin Time 14.2 11.4 - 15.0 seconds   INR 1.08   Type and screen     Status: None (Preliminary result)   Collection Time: 05/13/15 12:16 AM  Result Value Ref Range   ABO/RH(D) O POS    Antibody Screen NEG    Sample Expiration 05/16/2015    Unit Number [id number]    Blood Component Type RBC, LR IRR    Unit division 00    Status of Unit ALLOCATED    Transfusion Status OK TO TRANSFUSE    Crossmatch Result Compatible   Prepare RBC     Status: None   Collection Time: 05/13/15 12:19 AM  Result Value Ref Range   Order Confirmation ORDER PROCESSED BY BLOOD BANK    No results found.  Review of Systems  Constitutional: Negative for fever and  chills.  HENT: Positive for nosebleeds. Negative for sore throat and tinnitus.   Eyes: Negative for blurred vision and redness.  Respiratory: Negative for cough and shortness of breath.   Cardiovascular: Negative for chest pain, palpitations, orthopnea and PND.  Gastrointestinal: Negative for nausea, vomiting, abdominal pain and diarrhea.  Genitourinary: Negative for dysuria, urgency and frequency.  Musculoskeletal: Negative for myalgias and joint pain.  Skin: Negative for rash.       No lesions  Neurological: Negative for speech change, focal weakness and weakness.  Endo/Heme/Allergies: Does not bruise/bleed easily.       No temperature intolerance  Psychiatric/Behavioral: Negative for depression and suicidal ideas.    Blood pressure 153/44, pulse 58, temperature 98 F (36.7 C), temperature source Oral, resp. rate 18, SpO2 97 %. Physical Exam  Vitals reviewed. Constitutional: She is oriented to person, place, and time. She appears well-developed and well-nourished.  HENT:  Head: Normocephalic and atraumatic.  Mouth/Throat: Oropharynx is clear and moist.  Eyes: Conjunctivae and EOM are normal. Pupils are equal, round, and reactive to light. No scleral icterus.  Neck: Normal range of motion. Neck supple. No JVD present. No tracheal deviation present. No thyromegaly present.  Cardiovascular: Normal rate, regular rhythm and normal heart sounds.  Exam reveals no gallop and no friction rub.   No murmur heard. Respiratory: Effort normal and breath sounds  normal.  GI: Soft. Bowel sounds are normal. She exhibits no distension. There is no tenderness.  Genitourinary:  Deferred  Musculoskeletal: Normal range of motion. She exhibits no edema.  Lymphadenopathy:    She has no cervical adenopathy.  Neurological: She is alert and oriented to person, place, and time. No cranial nerve deficit. She exhibits normal muscle tone.  Skin: Skin is warm and dry. No rash noted. No erythema.  Psychiatric:  She has a normal mood and affect. Her behavior is normal. Judgment and thought content normal.     Assessment/Plan This is an 81 year old Caucasian female admitted for anterior epistaxis, anemia and thrombocytopenia. 1. Anterior epistaxis: Active bleeding has stopped at this time. We will attempt to keep mucosa moist and will apply direct pressure for hemostasis in the event of recurrent nosebleed. Patient is on no anticoagulation. 2. Anemia: Unlikely secondary to nosebleed today. The patient has lung cancer and is undergoing chemotherapy has made her pancytopenic. She is receiving 1 unit packed red blood cells. She is asymptomatic of her anemia at this time. 3. Thrombocytopenia: Contributes to epistaxis. Discussed with oncology on call possible platelet transfusion. Advised against this at this time. Consult oncology in the morning to coordinate care with the patient's oncologist. 4. Hypertension: Continue Diovan HCT 5. Diabetes mellitus type 2: May continue Tradjenta; place patient on sliding scale while hospitalized 6. DVT prophylaxis: SCDs 7. GI prophylaxis: None The patient is full code. Time spent on admission orders and patient care approximately 35 minutes  Harrie Foreman 05/13/2015, 2:10 AM

## 2015-05-13 NOTE — Plan of Care (Signed)
Problem: Discharge Progression Outcomes Goal: Discharge plan in place and appropriate Outcome: Progressing Patient goes by Stephanie Fletcher  From [facility] Hx of HTN, hypercholesterolemia, GERD, lung cancer. Pt has had 12 chemotherapy tx, 5 rounds of radiation, 4 more chemo treatments. Next one is in September. Continue home medications. High Fall Risk, Full Code

## 2015-05-13 NOTE — Progress Notes (Signed)
Patient discharge back to [facility] care per MD orders. VSS.All discharge instructions given and all questions answered. Medications and valuables returned.

## 2015-05-13 NOTE — Progress Notes (Signed)
Discharge order needed for patient

## 2015-05-13 NOTE — Discharge Instructions (Signed)
Resume usual medications.  Follow up with oncologist in [place] next week.

## 2015-05-14 ENCOUNTER — Other Ambulatory Visit: Payer: Medicare Other

## 2015-05-14 ENCOUNTER — Telehealth: Payer: Self-pay | Admitting: Internal Medicine

## 2015-05-14 LAB — TYPE AND SCREEN
ABO/RH(D): O POS
Antibody Screen: NEGATIVE
UNIT DIVISION: 0

## 2015-05-14 NOTE — Telephone Encounter (Signed)
Per pof pt is aware to cancel 9/1 lab  Stephanie Fletcher

## 2015-05-21 ENCOUNTER — Ambulatory Visit (HOSPITAL_BASED_OUTPATIENT_CLINIC_OR_DEPARTMENT_OTHER): Payer: Medicare Other

## 2015-05-21 ENCOUNTER — Telehealth: Payer: Self-pay | Admitting: Internal Medicine

## 2015-05-21 ENCOUNTER — Ambulatory Visit (HOSPITAL_BASED_OUTPATIENT_CLINIC_OR_DEPARTMENT_OTHER): Payer: Medicare Other | Admitting: Internal Medicine

## 2015-05-21 ENCOUNTER — Encounter: Payer: Self-pay | Admitting: Internal Medicine

## 2015-05-21 ENCOUNTER — Other Ambulatory Visit (HOSPITAL_BASED_OUTPATIENT_CLINIC_OR_DEPARTMENT_OTHER): Payer: Medicare Other

## 2015-05-21 VITALS — BP 156/50 | HR 85 | Temp 97.7°F | Resp 18 | Ht 63.0 in | Wt 147.7 lb

## 2015-05-21 DIAGNOSIS — G4709 Other insomnia: Secondary | ICD-10-CM | POA: Diagnosis not present

## 2015-05-21 DIAGNOSIS — C3432 Malignant neoplasm of lower lobe, left bronchus or lung: Secondary | ICD-10-CM | POA: Diagnosis not present

## 2015-05-21 DIAGNOSIS — Z5111 Encounter for antineoplastic chemotherapy: Secondary | ICD-10-CM

## 2015-05-21 DIAGNOSIS — C3430 Malignant neoplasm of lower lobe, unspecified bronchus or lung: Secondary | ICD-10-CM

## 2015-05-21 DIAGNOSIS — R040 Epistaxis: Secondary | ICD-10-CM

## 2015-05-21 LAB — COMPREHENSIVE METABOLIC PANEL (CC13)
ALBUMIN: 3.4 g/dL — AB (ref 3.5–5.0)
ALK PHOS: 61 U/L (ref 40–150)
ALT: 12 U/L (ref 0–55)
AST: 14 U/L (ref 5–34)
Anion Gap: 13 mEq/L — ABNORMAL HIGH (ref 3–11)
BUN: 28.9 mg/dL — AB (ref 7.0–26.0)
CALCIUM: 9.4 mg/dL (ref 8.4–10.4)
CO2: 18 mEq/L — ABNORMAL LOW (ref 22–29)
CREATININE: 1.4 mg/dL — AB (ref 0.6–1.1)
Chloride: 108 mEq/L (ref 98–109)
EGFR: 35 mL/min/{1.73_m2} — ABNORMAL LOW (ref >90)
Glucose: 253 mg/dl — ABNORMAL HIGH (ref 70–140)
Potassium: 4.3 mEq/L (ref 3.5–5.1)
Sodium: 140 mEq/L (ref 136–145)
TOTAL PROTEIN: 6.5 g/dL (ref 6.4–8.3)
Total Bilirubin: 0.3 mg/dL (ref 0.20–1.20)

## 2015-05-21 LAB — CBC WITH DIFFERENTIAL/PLATELET
BASO%: 0.3 % (ref 0.0–2.0)
BASOS ABS: 0 10*3/uL (ref 0.0–0.1)
EOS%: 0.8 % (ref 0.0–7.0)
Eosinophils Absolute: 0 10*3/uL (ref 0.0–0.5)
HEMATOCRIT: 27.4 % — AB (ref 34.8–46.6)
HEMOGLOBIN: 9.2 g/dL — AB (ref 11.6–15.9)
LYMPH#: 0.5 10*3/uL — AB (ref 0.9–3.3)
LYMPH%: 9.7 % — ABNORMAL LOW (ref 14.0–49.7)
MCH: 30.7 pg (ref 25.1–34.0)
MCHC: 33.4 g/dL (ref 31.5–36.0)
MCV: 92 fL (ref 79.5–101.0)
MONO#: 0.6 10*3/uL (ref 0.1–0.9)
MONO%: 11.3 % (ref 0.0–14.0)
NEUT#: 4 10*3/uL (ref 1.5–6.5)
NEUT%: 77.9 % — ABNORMAL HIGH (ref 38.4–76.8)
Platelets: 354 10*3/uL (ref 145–400)
RBC: 2.98 10*6/uL — ABNORMAL LOW (ref 3.70–5.45)
RDW: 14 % (ref 11.2–14.5)
WBC: 5.2 10*3/uL (ref 3.9–10.3)

## 2015-05-21 MED ORDER — SODIUM CHLORIDE 0.9 % IV SOLN
229.6000 mg | Freq: Once | INTRAVENOUS | Status: AC
  Administered 2015-05-21: 230 mg via INTRAVENOUS
  Filled 2015-05-21: qty 23

## 2015-05-21 MED ORDER — SODIUM CHLORIDE 0.9 % IV SOLN
Freq: Once | INTRAVENOUS | Status: AC
  Administered 2015-05-21: 11:00:00 via INTRAVENOUS

## 2015-05-21 MED ORDER — DEXAMETHASONE SODIUM PHOSPHATE 100 MG/10ML IJ SOLN
Freq: Once | INTRAMUSCULAR | Status: AC
  Administered 2015-05-21: 11:00:00 via INTRAVENOUS
  Filled 2015-05-21: qty 8

## 2015-05-21 MED ORDER — SODIUM CHLORIDE 0.9 % IV SOLN
400.0000 mg/m2 | Freq: Once | INTRAVENOUS | Status: AC
  Administered 2015-05-21: 700 mg via INTRAVENOUS
  Filled 2015-05-21: qty 28

## 2015-05-21 NOTE — Progress Notes (Signed)
[facility] [phone]   [phone]) [place], [place] [address]  DIAGNOSIS: Metastatic non-small cell lung cancer, adenocarcinoma with negative EGFR mutation and negative gene translocation initially diagnosed as stage IB in February 2014 with recurrence in March 2016.   PRIOR THERAPY: Status post stereotactic radiotherapy under the care of Dr. Lisbeth Renshaw completed 01/10/2013.  CURRENT THERAPY: Systemic chemotherapy with carboplatin for AUC of 4 and Alimta 400 MG/M2 every 3 weeks. First dose 04/06/2015. Status post 2 cycles.  INTERVAL HISTORY: Stephanie Fletcher 81 y.o. female returns to the clinic today for follow-up visit accompanied by her niece, [name]. The patient is doing fine today with no specific complaints except for mild shortness of breath. She is tolerating her treatment with carboplatin and Alimta fairly well except for pancytopenia and the patient had an episode of epistaxis and blood loss requiring PRBCs transfusion. She continues to have mild fatigue. She denied having any significant fever or chills. The patient denied having any chest pain, cough or hemoptysis. She denied having any nausea or vomiting. She is here today to start cycle #3 of her systemic chemotherapy.  MEDICAL HISTORY: Past Medical History  Diagnosis Date  . Hypercholesterolemia   . Hypertension   . Diverticulitis   . Gastric ulcer   . Arthritis   . Anemia   . Back injury 1998    T-12 fracture (brace)  . Nodule of left lung   . GERD (gastroesophageal reflux disease)   . History of radiation therapy 12/31/2012-01/10/2013    60 gray to left lower lung  . Peripheral vascular disease 1957/1997    DVT bilateral following fall  . Hx of cancer of lung 2014    tx with radiation only  . Numbness     left lower leg since left total knee nerve damage  . History of kidney stones     x1 attack many yrs ago  .  Difficulty sleeping   . Cancer 2014    adenocarcinoma/  lung  . Diabetes mellitus without complication     type 2-metformin    ALLERGIES:  is allergic to actos and fosamax.  MEDICATIONS:  Current Outpatient Prescriptions  Medication Sig Dispense Refill  . acetaminophen (TYLENOL) 500 MG tablet Take 1,000 mg by mouth at bedtime as needed for mild pain.    [name] aspirin EC 81 MG tablet Take 81 mg by mouth.    . bisacodyl (DULCOLAX) 10 MG suppository Place 1 suppository (10 mg total) rectally daily as needed for moderate constipation. 12 suppository 0  . cyanocobalamin (,VITAMIN B-12,) 1000 MCG/ML injection 1,000 mcg.    [name] dexamethasone (DECADRON) 4 MG tablet 4 mg po bid the day before, day of and day after chemo 40 tablet 1  . DHA-EPA-VITAMIN E PO Take by mouth.    . dicyclomine (BENTYL) 20 MG tablet Take 20 mg by mouth every 6 (six) hours as needed (for stomach).    . docusate sodium (COLACE) 100 MG capsule Take 1 capsule (100 mg total) by mouth 2 (two) times daily. 10 capsule 0  . folic acid (FOLVITE) 1 MG tablet Take 1 tablet (1 mg total) by mouth daily. 30 tablet 4  . glimepiride (AMARYL) 2 MG tablet Take 2 mg by mouth daily with breakfast.    . glucose blood (ONE TOUCH ULTRA TEST) test strip Use 2 (two) times daily. Use as instructed.    [name] HUMALOG [facility]  100 UNIT/ML KiwkPen See admin instructions.  0  . menthol-cetylpyridinium (CEPACOL) 3 MG lozenge Take 1 lozenge by mouth as needed for sore throat.    . metFORMIN (GLUCOPHAGE) 500 MG tablet Take 500-1,000 mg by mouth 3 (three) times daily. Take 2 tablets (1000 mg) every morning, 1 tablet (500 mg) at noon, 1 tablet (500 mg) every evening    . Multiple Vitamins-Minerals (PRESERVISION AREDS) TABS Take 1 each by mouth.    . ondansetron (ZOFRAN) 4 MG tablet Take 1 tablet (4 mg total) by mouth every 6 (six) hours as needed for nausea. 40 tablet 0  . pantoprazole (PROTONIX) 40 MG tablet Take 40 mg by mouth daily.    . pioglitazone (ACTOS) 15 MG  tablet Take 15 mg by mouth daily with breakfast.     . polyethylene glycol (MIRALAX / GLYCOLAX) packet Take 17 g by mouth daily as needed (constipation).    . pravastatin (PRAVACHOL) 20 MG tablet Take 20 mg by mouth at bedtime.    . prochlorperazine (COMPAZINE) 10 MG tablet Take 1 tablet (10 mg total) by mouth every 6 (six) hours as needed for nausea or vomiting. 30 tablet 0  . sitaGLIPtin (JANUVIA) 100 MG tablet Take 100 mg by mouth daily with breakfast.     . temazepam (RESTORIL) 30 MG capsule Take 1 capsule (30 mg total) by mouth at bedtime as needed for sleep. 30 capsule 0  . valsartan-hydrochlorothiazide (DIOVAN HCT) 160-25 MG per tablet Take 1 tablet by mouth every morning.     No current facility-administered medications for this visit.    SURGICAL HISTORY:  Past Surgical History  Procedure Laterality Date  . Abdominal hysterectomy  1979  . Hernia repair Right 1972  . Eye surgery Bilateral 2007, 2008    cataracts  . Appendectomy  1949  . Breast biopsy Bilateral [id number]  . Carpal tunnel release Bilateral 1997  . Skin graft  1955    right groin  . Cholecystectomy    . Video bronchoscopy with endobronchial navigation N/A 11/22/2012    Procedure: VIDEO BRONCHOSCOPY WITH ENDOBRONCHIAL NAVIGATION;  Surgeon: Melrose Nakayama, MD;  Location: [facility];  Service: Thoracic;  Laterality: N/A;  . Total knee arthroplasty Left 04/22/2013    Procedure: LEFT TOTAL KNEE ARTHROPLASTY;  Surgeon: Gearlean Alf, MD;  Location[facility];  Service: Orthopedics;  Laterality: Left;  . Superficial peroneal nerve release Left 07/19/2013    Procedure: LEFT PERONEAL NERVE DECOMPRESSION ;  Surgeon: Gearlean Alf, MD;  Location: [facility];  Service: Orthopedics;  Laterality: Left;  . Total knee arthroplasty Right 10/06/2014    Procedure: RIGHT TOTAL KNEE ARTHROPLASTY;  Surgeon: Gearlean Alf, MD;  Location[facility];  Service: Orthopedics;  Laterality: Right;    REVIEW OF SYSTEMS:  A comprehensive review of  systems was negative except for: Constitutional: positive for fatigue Ears, nose, mouth, throat, and face: positive for epistaxis Respiratory: positive for dyspnea on exertion   PHYSICAL EXAMINATION: General appearance: alert, cooperative and no distress Head: Normocephalic, without obvious abnormality, atraumatic Neck: no adenopathy, no JVD, supple, symmetrical, trachea midline and thyroid not enlarged, symmetric, no tenderness/mass/nodules Lymph nodes: Cervical, supraclavicular, and axillary nodes normal. Resp: clear to auscultation bilaterally Back: symmetric, no curvature. ROM normal. No CVA tenderness. Cardio: regular rate and rhythm, S1, S2 normal, no murmur, click, rub or gallop GI: soft, non-tender; bowel sounds normal; no masses,  no organomegaly Extremities: extremities normal, atraumatic, no cyanosis or edema Neurologic: Alert and oriented X 3, normal  strength and tone. Normal symmetric reflexes. Normal coordination and gait  ECOG PERFORMANCE STATUS: 1 - Symptomatic but completely ambulatory  Blood pressure 156/50, pulse 85, temperature 97.7 F (36.5 C), temperature source Oral, resp. rate 18, height '5\' 3"'  (1.6 m), weight 147 lb 11.2 oz (66.996 kg), SpO2 100 %.  LABORATORY DATA: Lab Results  Component Value Date   WBC 5.2 05/21/2015   HGB 9.2* 05/21/2015   HCT 27.4* 05/21/2015   MCV 92.0 05/21/2015   PLT 354 05/21/2015      Chemistry      Component Value Date/Time   NA 135 05/12/2015 2201   NA 136 05/07/2015 0854   NA 141 07/04/2014 1816   K 4.7 05/12/2015 2201   K 4.6 05/07/2015 0854   K 4.6 07/04/2014 1816   CL 105 05/12/2015 2201   CL 108* 07/04/2014 1816   CO2 24 05/12/2015 2201   CO2 21* 05/07/2015 0854   CO2 28 07/04/2014 1816   BUN 35* 05/12/2015 2201   BUN 25.4 05/07/2015 0854   BUN 35* 07/04/2014 1816   CREATININE 1.18* 05/12/2015 2201   CREATININE 1.3* 05/07/2015 0854   CREATININE 1.01 07/04/2014 1816      Component Value Date/Time   CALCIUM  8.5* 05/12/2015 2201   CALCIUM 9.3 05/07/2015 0854   CALCIUM 9.1 07/04/2014 1816   ALKPHOS 58 05/07/2015 0854   ALKPHOS 51 09/29/2014 1410   ALKPHOS 52 07/04/2014 1816   AST 19 05/07/2015 0854   AST 18 09/29/2014 1410   AST 15 07/04/2014 1816   ALT 14 05/07/2015 0854   ALT 11 09/29/2014 1410   ALT 18 07/04/2014 1816   BILITOT 0.26 05/07/2015 0854   BILITOT 0.3 09/29/2014 1410   BILITOT 0.1* 07/04/2014 1816       RADIOGRAPHIC STUDIES: No results found.  ASSESSMENT AND PLAN: This is a very pleasant 81 years old white female with metastatic non-small cell lung cancer, adenocarcinoma with negative EGFR mutation and negative ALK gene translocation. She is currently undergoing systemic chemotherapy with carboplatin and Alimta status post 2 cycles. Her last cycle was complicated with significant thrombocytopenia and anemia. The patient received PRBCs transfusion. I recommended for the patient to proceed with cycle #3 today but I will reduce the dose of carboplatin to AUC of 4 and Alimta 400 MG/M2 because of the pancytopenia. She will receive vitamin B 12 injection last week at her primary care physician's office. The patient would come back for follow-up visit in 3 weeks for evaluation after repeating CT scan of the chest, abdomen and pelvis for restaging of her disease. For insomnia, she will continue on Restoril 30 mg by mouth daily at bedtime. The patient was advised to call immediately if she has any concerning symptoms in the interval. The patient voices understanding of current disease status and treatment options and is in agreement with the current care plan.  All questions were answered. The patient knows to call the clinic with any problems, questions or concerns. We can certainly see the patient much sooner if necessary.  Disclaimer: This note was dictated with voice recognition software. Similar sounding words can inadvertently be transcribed and may not be corrected upon  review.

## 2015-05-21 NOTE — Telephone Encounter (Signed)
per pof to sch pt appt-sent Dr Julien Nordmann email to adv no slots avaiable-awaiting reply-adv pt will call once reply-

## 2015-05-21 NOTE — Progress Notes (Signed)
Per Pt =she said she received vit b 12 injection on 8/29 at [facility]. Per Julien Nordmann he said to cancel injection of vit b 12 today.

## 2015-05-21 NOTE — Patient Instructions (Signed)
[facility] Discharge Instructions for Patients Receiving Chemotherapy  Today you received the following chemotherapy agents: Alimta/Carboplatin  To help prevent nausea and vomiting after your treatment, we encourage you to take your nausea medication as directed.   If you develop nausea and vomiting that is not controlled by your nausea medication, call the clinic.   BELOW ARE SYMPTOMS THAT SHOULD BE REPORTED IMMEDIATELY:  *FEVER GREATER THAN 100.5 F  *CHILLS WITH OR WITHOUT FEVER  NAUSEA AND VOMITING THAT IS NOT CONTROLLED WITH YOUR NAUSEA MEDICATION  *UNUSUAL SHORTNESS OF BREATH  *UNUSUAL BRUISING OR BLEEDING  TENDERNESS IN MOUTH AND THROAT WITH OR WITHOUT PRESENCE OF ULCERS  *URINARY PROBLEMS  *BOWEL PROBLEMS  UNUSUAL RASH Items with * indicate a potential emergency and should be followed up as soon as possible.  Feel free to call the clinic you have any questions or concerns. The clinic phone number is [phone].  Please show the CHEMO ALERT CARD at check-in to the Emergency Department and triage nurse.   

## 2015-05-28 ENCOUNTER — Other Ambulatory Visit (HOSPITAL_BASED_OUTPATIENT_CLINIC_OR_DEPARTMENT_OTHER): Payer: Medicare Other

## 2015-05-28 DIAGNOSIS — C3432 Malignant neoplasm of lower lobe, left bronchus or lung: Secondary | ICD-10-CM

## 2015-05-28 LAB — COMPREHENSIVE METABOLIC PANEL (CC13)
ALT: 9 U/L (ref 0–55)
AST: 16 U/L (ref 5–34)
Albumin: 3.3 g/dL — ABNORMAL LOW (ref 3.5–5.0)
Alkaline Phosphatase: 56 U/L (ref 40–150)
Anion Gap: 8 mEq/L (ref 3–11)
BUN: 25.9 mg/dL (ref 7.0–26.0)
CHLORIDE: 111 meq/L — AB (ref 98–109)
CO2: 24 meq/L (ref 22–29)
CREATININE: 1.5 mg/dL — AB (ref 0.6–1.1)
Calcium: 9 mg/dL (ref 8.4–10.4)
EGFR: 31 mL/min/{1.73_m2} — ABNORMAL LOW (ref >90)
GLUCOSE: 213 mg/dL — AB (ref 70–140)
POTASSIUM: 4.1 meq/L (ref 3.5–5.1)
SODIUM: 143 meq/L (ref 136–145)
Total Bilirubin: 0.3 mg/dL (ref 0.20–1.20)
Total Protein: 6.4 g/dL (ref 6.4–8.3)

## 2015-05-28 LAB — CBC WITH DIFFERENTIAL/PLATELET
BASO%: 0.9 % (ref 0.0–2.0)
BASOS ABS: 0 10*3/uL (ref 0.0–0.1)
EOS%: 3.8 % (ref 0.0–7.0)
Eosinophils Absolute: 0 10*3/uL (ref 0.0–0.5)
HCT: 27.7 % — ABNORMAL LOW (ref 34.8–46.6)
HGB: 9 g/dL — ABNORMAL LOW (ref 11.6–15.9)
LYMPH#: 0.2 10*3/uL — AB (ref 0.9–3.3)
LYMPH%: 22.6 % (ref 14.0–49.7)
MCH: 30.5 pg (ref 25.1–34.0)
MCHC: 32.5 g/dL (ref 31.5–36.0)
MCV: 93.9 fL (ref 79.5–101.0)
MONO#: 0.1 10*3/uL (ref 0.1–0.9)
MONO%: 6.6 % (ref 0.0–14.0)
NEUT#: 0.7 10*3/uL — ABNORMAL LOW (ref 1.5–6.5)
NEUT%: 66.1 % (ref 38.4–76.8)
Platelets: 161 10*3/uL (ref 145–400)
RBC: 2.95 10*6/uL — ABNORMAL LOW (ref 3.70–5.45)
RDW: 15.3 % — AB (ref 11.2–14.5)
WBC: 1.1 10*3/uL — ABNORMAL LOW (ref 3.9–10.3)

## 2015-05-28 LAB — TECHNOLOGIST REVIEW

## 2015-06-01 ENCOUNTER — Other Ambulatory Visit: Payer: Self-pay | Admitting: Medical Oncology

## 2015-06-01 DIAGNOSIS — G4700 Insomnia, unspecified: Secondary | ICD-10-CM

## 2015-06-01 MED ORDER — TEMAZEPAM 30 MG PO CAPS: 30.0000 mg | ORAL_CAPSULE | Freq: Every evening | ORAL | Status: DC | PRN

## 2015-06-04 ENCOUNTER — Other Ambulatory Visit (HOSPITAL_BASED_OUTPATIENT_CLINIC_OR_DEPARTMENT_OTHER): Payer: Medicare Other

## 2015-06-04 DIAGNOSIS — C3432 Malignant neoplasm of lower lobe, left bronchus or lung: Secondary | ICD-10-CM

## 2015-06-04 LAB — CBC WITH DIFFERENTIAL/PLATELET
BASO%: 0.4 % (ref 0.0–2.0)
Basophils Absolute: 0 10*3/uL (ref 0.0–0.1)
EOS%: 12.1 % — AB (ref 0.0–7.0)
Eosinophils Absolute: 0.4 10*3/uL (ref 0.0–0.5)
HEMATOCRIT: 24.9 % — AB (ref 34.8–46.6)
HEMOGLOBIN: 8.2 g/dL — AB (ref 11.6–15.9)
LYMPH#: 0.4 10*3/uL — AB (ref 0.9–3.3)
LYMPH%: 13.6 % — ABNORMAL LOW (ref 14.0–49.7)
MCH: 30.2 pg (ref 25.1–34.0)
MCHC: 32.9 g/dL (ref 31.5–36.0)
MCV: 91.6 fL (ref 79.5–101.0)
MONO#: 0.3 10*3/uL (ref 0.1–0.9)
MONO%: 10.9 % (ref 0.0–14.0)
NEUT#: 1.9 10*3/uL (ref 1.5–6.5)
NEUT%: 63 % (ref 38.4–76.8)
Platelets: 115 10*3/uL — ABNORMAL LOW (ref 145–400)
RBC: 2.72 10*6/uL — ABNORMAL LOW (ref 3.70–5.45)
RDW: 15.5 % — AB (ref 11.2–14.5)
WBC: 3 10*3/uL — ABNORMAL LOW (ref 3.9–10.3)

## 2015-06-04 LAB — COMPREHENSIVE METABOLIC PANEL (CC13)
ALBUMIN: 3.2 g/dL — AB (ref 3.5–5.0)
ALT: 12 U/L (ref 0–55)
AST: 19 U/L (ref 5–34)
Alkaline Phosphatase: 64 U/L (ref 40–150)
Anion Gap: 8 mEq/L (ref 3–11)
BUN: 24.8 mg/dL (ref 7.0–26.0)
CALCIUM: 8.9 mg/dL (ref 8.4–10.4)
CHLORIDE: 110 meq/L — AB (ref 98–109)
CO2: 23 mEq/L (ref 22–29)
CREATININE: 1.3 mg/dL — AB (ref 0.6–1.1)
EGFR: 38 mL/min/{1.73_m2} — ABNORMAL LOW (ref >90)
GLUCOSE: 131 mg/dL (ref 70–140)
Potassium: 4.4 mEq/L (ref 3.5–5.1)
Sodium: 141 mEq/L (ref 136–145)
Total Bilirubin: <0.3 mg/dL (ref 0.20–1.20)
Total Protein: 6.4 g/dL (ref 6.4–8.3)

## 2015-06-09 ENCOUNTER — Encounter (HOSPITAL_COMMUNITY): Payer: Self-pay

## 2015-06-09 ENCOUNTER — Ambulatory Visit (HOSPITAL_COMMUNITY)
Admission: RE | Admit: 2015-06-09 | Discharge: 2015-06-09 | Disposition: A | Discharge status: Home / Self Care | Payer: Medicare Other | Source: Ambulatory Visit | Attending: Internal Medicine | Admitting: Internal Medicine

## 2015-06-09 DIAGNOSIS — R040 Epistaxis: Secondary | ICD-10-CM | POA: Diagnosis present

## 2015-06-09 DIAGNOSIS — R918 Other nonspecific abnormal finding of lung field: Secondary | ICD-10-CM | POA: Insufficient documentation

## 2015-06-09 DIAGNOSIS — J90 Pleural effusion, not elsewhere classified: Secondary | ICD-10-CM | POA: Diagnosis not present

## 2015-06-09 DIAGNOSIS — C3430 Malignant neoplasm of lower lobe, unspecified bronchus or lung: Secondary | ICD-10-CM | POA: Diagnosis not present

## 2015-06-09 DIAGNOSIS — R590 Localized enlarged lymph nodes: Secondary | ICD-10-CM | POA: Insufficient documentation

## 2015-06-09 DIAGNOSIS — K449 Diaphragmatic hernia without obstruction or gangrene: Secondary | ICD-10-CM | POA: Diagnosis not present

## 2015-06-09 DIAGNOSIS — I700 Atherosclerosis of aorta: Secondary | ICD-10-CM | POA: Diagnosis not present

## 2015-06-09 MED ORDER — IOHEXOL 300 MG/ML  SOLN
100.0000 mL | Freq: Once | INTRAMUSCULAR | Status: AC | PRN
  Administered 2015-06-09: 80 mL via INTRAVENOUS

## 2015-06-11 ENCOUNTER — Other Ambulatory Visit: Payer: Self-pay | Admitting: *Deleted

## 2015-06-11 ENCOUNTER — Encounter: Payer: Self-pay | Admitting: Internal Medicine

## 2015-06-11 ENCOUNTER — Other Ambulatory Visit (HOSPITAL_BASED_OUTPATIENT_CLINIC_OR_DEPARTMENT_OTHER): Payer: Medicare Other

## 2015-06-11 ENCOUNTER — Ambulatory Visit (HOSPITAL_COMMUNITY)
Admission: RE | Admit: 2015-06-11 | Discharge: 2015-06-11 | Disposition: A | Discharge status: Home / Self Care | Payer: Medicare Other | Source: Ambulatory Visit | Attending: Internal Medicine | Admitting: Internal Medicine

## 2015-06-11 ENCOUNTER — Ambulatory Visit (HOSPITAL_BASED_OUTPATIENT_CLINIC_OR_DEPARTMENT_OTHER): Payer: Medicare Other

## 2015-06-11 ENCOUNTER — Ambulatory Visit (HOSPITAL_BASED_OUTPATIENT_CLINIC_OR_DEPARTMENT_OTHER): Payer: Medicare Other | Admitting: Internal Medicine

## 2015-06-11 ENCOUNTER — Telehealth: Payer: Self-pay | Admitting: Internal Medicine

## 2015-06-11 VITALS — BP 176/55 | HR 66 | Temp 97.2°F | Resp 20

## 2015-06-11 VITALS — BP 181/37 | HR 68 | Temp 98.0°F | Resp 17 | Ht 63.0 in | Wt 147.6 lb

## 2015-06-11 DIAGNOSIS — Z5111 Encounter for antineoplastic chemotherapy: Secondary | ICD-10-CM

## 2015-06-11 DIAGNOSIS — D6489 Other specified anemias: Secondary | ICD-10-CM

## 2015-06-11 DIAGNOSIS — C3432 Malignant neoplasm of lower lobe, left bronchus or lung: Secondary | ICD-10-CM | POA: Insufficient documentation

## 2015-06-11 DIAGNOSIS — D6481 Anemia due to antineoplastic chemotherapy: Secondary | ICD-10-CM | POA: Insufficient documentation

## 2015-06-11 DIAGNOSIS — T451X5A Adverse effect of antineoplastic and immunosuppressive drugs, initial encounter: Secondary | ICD-10-CM

## 2015-06-11 DIAGNOSIS — C3430 Malignant neoplasm of lower lobe, unspecified bronchus or lung: Secondary | ICD-10-CM

## 2015-06-11 DIAGNOSIS — R040 Epistaxis: Secondary | ICD-10-CM

## 2015-06-11 LAB — CBC WITH DIFFERENTIAL/PLATELET
BASO%: 0.2 % (ref 0.0–2.0)
Basophils Absolute: 0 10*3/uL (ref 0.0–0.1)
EOS%: 2.2 % (ref 0.0–7.0)
Eosinophils Absolute: 0.1 10*3/uL (ref 0.0–0.5)
HEMATOCRIT: 25.9 % — AB (ref 34.8–46.6)
HGB: 8.3 g/dL — ABNORMAL LOW (ref 11.6–15.9)
LYMPH#: 0.5 10*3/uL — AB (ref 0.9–3.3)
LYMPH%: 11 % — AB (ref 14.0–49.7)
MCH: 30.2 pg (ref 25.1–34.0)
MCHC: 32 g/dL (ref 31.5–36.0)
MCV: 94.2 fL (ref 79.5–101.0)
MONO#: 0.7 10*3/uL (ref 0.1–0.9)
MONO%: 14.2 % — ABNORMAL HIGH (ref 0.0–14.0)
NEUT%: 72.4 % (ref 38.4–76.8)
NEUTROS ABS: 3.6 10*3/uL (ref 1.5–6.5)
Platelets: 283 10*3/uL (ref 145–400)
RBC: 2.75 10*6/uL — AB (ref 3.70–5.45)
RDW: 17.6 % — ABNORMAL HIGH (ref 11.2–14.5)
WBC: 4.9 10*3/uL (ref 3.9–10.3)
nRBC: 0 % (ref 0–0)

## 2015-06-11 LAB — COMPREHENSIVE METABOLIC PANEL (CC13)
ALBUMIN: 3.4 g/dL — AB (ref 3.5–5.0)
ALK PHOS: 62 U/L (ref 40–150)
ALT: 10 U/L (ref 0–55)
ANION GAP: 12 meq/L — AB (ref 3–11)
AST: 15 U/L (ref 5–34)
BUN: 22.7 mg/dL (ref 7.0–26.0)
CO2: 18 mEq/L — ABNORMAL LOW (ref 22–29)
Calcium: 9.2 mg/dL (ref 8.4–10.4)
Chloride: 109 mEq/L (ref 98–109)
Creatinine: 1.5 mg/dL — ABNORMAL HIGH (ref 0.6–1.1)
EGFR: 33 mL/min/{1.73_m2} — AB (ref >90)
Glucose: 268 mg/dl — ABNORMAL HIGH (ref 70–140)
POTASSIUM: 4 meq/L (ref 3.5–5.1)
SODIUM: 139 meq/L (ref 136–145)
TOTAL PROTEIN: 6.4 g/dL (ref 6.4–8.3)

## 2015-06-11 LAB — PREPARE RBC (CROSSMATCH)

## 2015-06-11 LAB — HOLD TUBE, BLOOD BANK

## 2015-06-11 MED ORDER — ACETAMINOPHEN 325 MG PO TABS
ORAL_TABLET | ORAL | Status: AC
  Filled 2015-06-11: qty 2

## 2015-06-11 MED ORDER — SODIUM CHLORIDE 0.9 % IV SOLN
375.0000 mg/m2 | Freq: Once | INTRAVENOUS | Status: AC
  Administered 2015-06-11: 650 mg via INTRAVENOUS
  Filled 2015-06-11: qty 26

## 2015-06-11 MED ORDER — SODIUM CHLORIDE 0.9 % IV SOLN: 250.0000 mL | Freq: Once | INTRAVENOUS | Status: DC

## 2015-06-11 MED ORDER — SODIUM CHLORIDE 0.9 % IJ SOLN
3.0000 mL | INTRAMUSCULAR | Status: DC | PRN
  Filled 2015-06-11: qty 10

## 2015-06-11 MED ORDER — SODIUM CHLORIDE 0.9 % IV SOLN
Freq: Once | INTRAVENOUS | Status: AC
  Administered 2015-06-11: 11:00:00 via INTRAVENOUS

## 2015-06-11 MED ORDER — SODIUM CHLORIDE 0.9 % IV SOLN
220.8000 mg | Freq: Once | INTRAVENOUS | Status: AC
  Administered 2015-06-11: 220 mg via INTRAVENOUS
  Filled 2015-06-11: qty 22

## 2015-06-11 MED ORDER — DIPHENHYDRAMINE HCL 25 MG PO CAPS
ORAL_CAPSULE | ORAL | Status: AC
  Filled 2015-06-11: qty 1

## 2015-06-11 MED ORDER — DIPHENHYDRAMINE HCL 25 MG PO CAPS
25.0000 mg | ORAL_CAPSULE | Freq: Once | ORAL | Status: AC
  Administered 2015-06-11: 25 mg via ORAL

## 2015-06-11 MED ORDER — ACETAMINOPHEN 325 MG PO TABS
650.0000 mg | ORAL_TABLET | Freq: Once | ORAL | Status: AC
  Administered 2015-06-11: 650 mg via ORAL

## 2015-06-11 MED ORDER — SODIUM CHLORIDE 0.9 % IV SOLN
Freq: Once | INTRAVENOUS | Status: AC
  Administered 2015-06-11: 12:00:00 via INTRAVENOUS
  Filled 2015-06-11: qty 8

## 2015-06-11 NOTE — Patient Instructions (Signed)
[facility] Discharge Instructions for Patients Receiving Chemotherapy  Today you received the following chemotherapy agents: alimta, carboplatin  To help prevent nausea and vomiting after your treatment, we encourage you to take your nausea medication.  Take it as often as prescribed.     If you develop nausea and vomiting that is not controlled by your nausea medication, call the clinic. If it is after clinic hours your family physician or the after hours number for the clinic or go to the Emergency Department.   BELOW ARE SYMPTOMS THAT SHOULD BE REPORTED IMMEDIATELY:  *FEVER GREATER THAN 100.5 F  *CHILLS WITH OR WITHOUT FEVER  NAUSEA AND VOMITING THAT IS NOT CONTROLLED WITH YOUR NAUSEA MEDICATION  *UNUSUAL SHORTNESS OF BREATH  *UNUSUAL BRUISING OR BLEEDING  TENDERNESS IN MOUTH AND THROAT WITH OR WITHOUT PRESENCE OF ULCERS  *URINARY PROBLEMS  *BOWEL PROBLEMS  UNUSUAL RASH Items with * indicate a potential emergency and should be followed up as soon as possible.  Feel free to call the clinic you have any questions or concerns. The clinic phone number is [phone].   I have been informed and understand all the instructions given to me. I know to contact the clinic, my physician, or go to the Emergency Department if any problems should occur. I do not have any questions at this time, but understand that I may call the clinic during office hours   should I have any questions or need assistance in obtaining follow up care.    __________________________________________  _____________  __________ Signature of Patient or Authorized Representative            Date                   Time    __________________________________________ Nurse's Signature   Blood Transfusion Information WHAT IS A BLOOD TRANSFUSION? A transfusion is the replacement of blood or some of its parts. Blood is made up of multiple cells which provide different functions.  Red blood  cells carry oxygen and are used for blood loss replacement.  White blood cells fight against infection.  Platelets control bleeding.  Plasma helps clot blood.  Other blood products are available for specialized needs, such as hemophilia or other clotting disorders. BEFORE THE TRANSFUSION  Who gives blood for transfusions?   You may be able to donate blood to be used at a later date on yourself (autologous donation).  Relatives can be asked to donate blood. This is generally not any safer than if you have received blood from a stranger. The same precautions are taken to ensure safety when a relative's blood is donated.  Healthy volunteers who are fully evaluated to make sure their blood is safe. This is blood bank blood. Transfusion therapy is the safest it has ever been in the practice of medicine. Before blood is taken from a donor, a complete history is taken to make sure that person has no history of diseases nor engages in risky social behavior (examples are intravenous drug use or sexual activity with multiple partners). The donor's travel history is screened to minimize risk of transmitting infections, such as malaria. The donated blood is tested for signs of infectious diseases, such as HIV and hepatitis. The blood is then tested to be sure it is compatible with you in order to minimize the chance of a transfusion reaction. If you or a relative donates blood, this is often done in anticipation of surgery and is not appropriate for emergency  situations. It takes many days to process the donated blood. RISKS AND COMPLICATIONS Although transfusion therapy is very safe and saves many lives, the main dangers of transfusion include:   Getting an infectious disease.  Developing a transfusion reaction. This is an allergic reaction to something in the blood you were given. Every precaution is taken to prevent this. The decision to have a blood transfusion has been considered carefully by your  caregiver before blood is given. Blood is not given unless the benefits outweigh the risks. AFTER THE TRANSFUSION  Right after receiving a blood transfusion, you will usually feel much better and more energetic. This is especially true if your red blood cells have gotten low (anemic). The transfusion raises the level of the red blood cells which carry oxygen, and this usually causes an energy increase.  The nurse administering the transfusion will monitor you carefully for complications. HOME CARE INSTRUCTIONS  No special instructions are needed after a transfusion. You may find your energy is better. Speak with your caregiver about any limitations on activity for underlying diseases you may have. SEEK MEDICAL CARE IF:   Your condition is not improving after your transfusion.  You develop redness or irritation at the intravenous (IV) site. SEEK IMMEDIATE MEDICAL CARE IF:  Any of the following symptoms occur over the next 12 hours:  Shaking chills.  You have a temperature by mouth above 102 F (38.9 C), not controlled by medicine.  Chest, back, or muscle pain.  People around you feel you are not acting correctly or are confused.  Shortness of breath or difficulty breathing.  Dizziness and fainting.  You get a rash or develop hives.  You have a decrease in urine output.  Your urine turns a dark color or changes to pink, red, or brown. Any of the following symptoms occur over the next 10 days:  You have a temperature by mouth above 102 F (38.9 C), not controlled by medicine.  Shortness of breath.  Weakness after normal activity.  The white part of the eye turns yellow (jaundice).  You have a decrease in the amount of urine or are urinating less often.  Your urine turns a dark color or changes to pink, red, or brown. Document Released: 08/26/2000 Document Revised: 11/21/2011 Document Reviewed: 04/14/2008 [facility] Patient Information 2015 [place], [place]. This information  is not intended to replace advice given to you by your health care provider. Make sure you discuss any questions you have with your health care provider.

## 2015-06-11 NOTE — Telephone Encounter (Signed)
per pof to sch pt for bloof trans-per MW has to be sch @ sickle cell-cld spoke w/Darlene and pt sch 9/30 '@9'$  for 2 uniis per Dr Julien Nordmann

## 2015-06-11 NOTE — Progress Notes (Signed)
Stephanie Fletcher [phone]   Fax:([phone]) [place], MD [place] [address]  DIAGNOSIS: Metastatic non-small cell lung cancer, adenocarcinoma with negative EGFR mutation and negative gene translocation initially diagnosed as stage IB in February 2014 with recurrence in March 2016.   PRIOR THERAPY: Status post stereotactic radiotherapy under the care of Dr. Lisbeth Renshaw completed 01/10/2013.  CURRENT THERAPY: Systemic chemotherapy with carboplatin for AUC of 4 and Alimta 400 MG/M2 every 3 weeks. First dose 04/06/2015. Status post 3 cycles.  INTERVAL HISTORY: Stephanie Fletcher 81 y.o. female returns to the clinic today for follow-up visit accompanied by her niece, Stephanie Fletcher. The patient is doing fine today with no specific complaints except for mild shortness of breath. She is tolerating her treatment with carboplatin and Alimta fairly well except for fatigue.  She denied having any significant fever or chills. The patient denied having any chest pain, cough or hemoptysis. She denied having any nausea or vomiting. The patient had repeat CT scan of the chest, abdomen and pelvis performed recently and she is here for evaluation and discussion of her scan results.Stephanie Fletcher  MEDICAL HISTORY: Past Medical History  Diagnosis Date  . Hypercholesterolemia   . Hypertension   . Diverticulitis   . Gastric ulcer   . Arthritis   . Anemia   . Back injury 1998    T-12 fracture (brace)  . Nodule of left lung   . GERD (gastroesophageal reflux disease)   . History of radiation therapy 12/31/2012-01/10/2013    60 gray to left lower lung  . Peripheral vascular disease 1957/1997    DVT bilateral following fall  . Hx of cancer of lung 2014    tx with radiation only  . Numbness     left lower leg since left total knee nerve damage  . History of kidney stones     x1 attack many yrs ago  . Difficulty sleeping   . Cancer 2014   adenocarcinoma/  lung  . Diabetes mellitus without complication     type 2-metformin    ALLERGIES:  is allergic to actos and fosamax.  MEDICATIONS:  Current Outpatient Prescriptions  Medication Sig Dispense Refill  . acetaminophen (TYLENOL) 500 MG tablet Take 1,000 mg by mouth at bedtime as needed for mild pain.    Stephanie Fletcher aspirin EC 81 MG tablet Take 81 mg by mouth.    . B-D UF III MINI PEN NEEDLES 31G X 5 MM MISC USE AS DIRECTED WITH HUMALOG PEN  0  . bisacodyl (DULCOLAX) 10 MG suppository Place 1 suppository (10 mg total) rectally daily as needed for moderate constipation. 12 suppository 0  . CARBOplatin (PARAPLATIN) 150 MG injection Inject into the vein.    . cyanocobalamin (,VITAMIN B-12,) 1000 MCG/ML injection 1,000 mcg.    Stephanie Fletcher dexamethasone (DECADRON) 4 MG tablet 4 mg po bid the day before, day of and day after chemo 40 tablet 1  . DHA-EPA-VITAMIN E PO Take by mouth.    . dicyclomine (BENTYL) 20 MG tablet Take 20 mg by mouth every 6 (six) hours as needed (for stomach).    . docusate sodium (COLACE) 100 MG capsule Take 1 capsule (100 mg total) by mouth 2 (two) times daily. 10 capsule 0  . folic acid (FOLVITE) 1 MG tablet Take 1 tablet (1 mg total) by mouth daily. 30 tablet 4  . glimepiride (AMARYL) 2 MG tablet Take 2 mg by mouth daily with  breakfast.    . glucose blood (ONE TOUCH ULTRA TEST) test strip Use 2 (two) times daily. Use as instructed.    Stephanie Fletcher HUMALOG KWIKPEN 100 UNIT/ML KiwkPen See admin instructions.  0  . menthol-cetylpyridinium (CEPACOL) 3 MG lozenge Take 1 lozenge by mouth as needed for sore throat.    . metFORMIN (GLUCOPHAGE) 500 MG tablet Take 500-1,000 mg by mouth 3 (three) times daily. Take 2 tablets (1000 mg) every morning, 1 tablet (500 mg) at noon, 1 tablet (500 mg) every evening    . Multiple Vitamins-Minerals (PRESERVISION AREDS) TABS Take 1 each by mouth.    . ondansetron (ZOFRAN) 4 MG tablet Take 1 tablet (4 mg total) by mouth every 6 (six) hours as needed for nausea.  40 tablet 0  . pantoprazole (PROTONIX) 40 MG tablet Take 40 mg by mouth daily.    Stephanie Fletcher PEMEtrexed (ALIMTA) 500 MG injection Inject into the vein.    . pioglitazone (ACTOS) 15 MG tablet Take 15 mg by mouth daily with breakfast.     . polyethylene glycol (MIRALAX / GLYCOLAX) packet Take 17 g by mouth daily as needed (constipation).    . pravastatin (PRAVACHOL) 20 MG tablet Take 20 mg by mouth at bedtime.    . prochlorperazine (COMPAZINE) 10 MG tablet Take 1 tablet (10 mg total) by mouth every 6 (six) hours as needed for nausea or vomiting. 30 tablet 0  . sitaGLIPtin (JANUVIA) 100 MG tablet Take 100 mg by mouth daily with breakfast.     . temazepam (RESTORIL) 30 MG capsule Take 1 capsule (30 mg total) by mouth at bedtime as needed for sleep. 30 capsule 0  . valsartan-hydrochlorothiazide (DIOVAN HCT) 160-25 MG per tablet Take 1 tablet by mouth every morning.     No current facility-administered medications for this visit.    SURGICAL HISTORY:  Past Surgical History  Procedure Laterality Date  . Abdominal hysterectomy  1979  . Hernia repair Right 1972  . Eye surgery Bilateral 2007, 2008    cataracts  . Appendectomy  1949  . Breast biopsy Bilateral [id number]  . Carpal tunnel release Bilateral 1997  . Skin graft  1955    right groin  . Cholecystectomy    . Video bronchoscopy with endobronchial navigation N/A 11/22/2012    Procedure: VIDEO BRONCHOSCOPY WITH ENDOBRONCHIAL NAVIGATION;  Surgeon: Melrose Nakayama, MD;  Location: [facility];  Service: Thoracic;  Laterality: N/A;  . Total knee arthroplasty Left 04/22/2013    Procedure: LEFT TOTAL KNEE ARTHROPLASTY;  Surgeon: Gearlean Alf, MD;  Location: [facility];  Service: Orthopedics;  Laterality: Left;  . Superficial peroneal nerve release Left 07/19/2013    Procedure: LEFT PERONEAL NERVE DECOMPRESSION ;  Surgeon: Gearlean Alf, MD;  Location: [facility];  Service: Orthopedics;  Laterality: Left;  . Total knee arthroplasty Right 10/06/2014    Procedure:  RIGHT TOTAL KNEE ARTHROPLASTY;  Surgeon: Gearlean Alf, MD;  Location: [facility];  Service: Orthopedics;  Laterality: Right;    REVIEW OF SYSTEMS:  Constitutional: positive for fatigue Eyes: negative Ears, nose, mouth, throat, and face: negative Respiratory: positive for dyspnea on exertion Cardiovascular: negative Gastrointestinal: negative Genitourinary:negative Integument/breast: negative Hematologic/lymphatic: negative Musculoskeletal:negative Neurological: negative Behavioral/Psych: negative Endocrine: negative Allergic/Immunologic: negative   PHYSICAL EXAMINATION: General appearance: alert, cooperative and no distress Head: Normocephalic, without obvious abnormality, atraumatic Neck: no adenopathy, no JVD, supple, symmetrical, trachea midline and thyroid not enlarged, symmetric, no tenderness/mass/nodules Lymph nodes: Cervical, supraclavicular, and axillary nodes normal. Resp: clear to auscultation bilaterally  Back: symmetric, no curvature. ROM normal. No CVA tenderness. Cardio: regular rate and rhythm, S1, S2 normal, no murmur, click, rub or gallop GI: soft, non-tender; bowel sounds normal; no masses,  no organomegaly Extremities: extremities normal, atraumatic, no cyanosis or edema Neurologic: Alert and oriented X 3, normal strength and tone. Normal symmetric reflexes. Normal coordination and gait  ECOG PERFORMANCE STATUS: 1 - Symptomatic but completely ambulatory  Blood pressure 181/37, pulse 68, temperature 98 F (36.7 C), temperature source Oral, resp. rate 17, height _0  (1.6 m), weight 147 lb 9.6 oz (66.951 kg), SpO2 100 %.  LABORATORY DATA: Lab Results  Component Value Date   WBC 4.9 06/11/2015   HGB 8.3* 06/11/2015   HCT 25.9* 06/11/2015   MCV 94.2 06/11/2015   PLT 283 06/11/2015      Chemistry      Component Value Date/Time   NA 139 06/11/2015 0907   NA 135 05/12/2015 2201   NA 141 07/04/2014 1816   K 4.0 06/11/2015 0907   K 4.7 05/12/2015 2201   K  4.6 07/04/2014 1816   CL 105 05/12/2015 2201   CL 108* 07/04/2014 1816   CO2 18* 06/11/2015 0907   CO2 24 05/12/2015 2201   CO2 28 07/04/2014 1816   BUN 22.7 06/11/2015 0907   BUN 35* 05/12/2015 2201   BUN 35* 07/04/2014 1816   CREATININE 1.5* 06/11/2015 0907   CREATININE 1.18* 05/12/2015 2201   CREATININE 1.01 07/04/2014 1816      Component Value Date/Time   CALCIUM 9.2 06/11/2015 0907   CALCIUM 8.5* 05/12/2015 2201   CALCIUM 9.1 07/04/2014 1816   ALKPHOS 62 06/11/2015 0907   ALKPHOS 51 09/29/2014 1410   ALKPHOS 52 07/04/2014 1816   AST 15 06/11/2015 0907   AST 18 09/29/2014 1410   AST 15 07/04/2014 1816   ALT 10 06/11/2015 0907   ALT 11 09/29/2014 1410   ALT 18 07/04/2014 1816   BILITOT <0.30 06/11/2015 0907   BILITOT 0.3 09/29/2014 1410   BILITOT 0.1* 07/04/2014 1816       RADIOGRAPHIC STUDIES: Ct Chest W Contrast  06/09/2015   CLINICAL DATA:  Restaging lung cancer  EXAM: CT CHEST, ABDOMEN, AND PELVIS WITH CONTRAST  TECHNIQUE: Multidetector CT imaging of the chest, abdomen and pelvis was performed following the standard protocol during bolus administration of intravenous contrast.  CONTRAST:  83m OMNIPAQUE IOHEXOL 300 MG/ML  SOLN  COMPARISON:  04/03/2015  FINDINGS: CT CHEST FINDINGS  Mediastinum/Nodes: The heart size is within normal limits. Aortic atherosclerosis noted. Calcifications within the LAD, left circumflex coronary arteries noted. The trachea is patent and is midline. Index right supraclavicular lymph node is stable measuring 7 mm. The index right paratracheal lymph node Measures 8 mm, image 20 of series 2. Previously 10 mm. The index sub- carinal lymph node Measures 10 mm, image 24/ series 2. Previously 12 mm.  Lungs/Pleura: There is a small to moderate left pleural effusion which is increased in volume from previous exam. The lower left lung mass measures 6.8 x 4.1 cm, image 32/ series 2. Previously 6.8 x 4.3 cm. Innumerable tiny nodules are again noted throughout  both lungs. Too numerous to count. Subjectively, the this appears similar to the previous exam.  Musculoskeletal: Scoliosis and degenerative disc disease noted within the thoracic spine. There is no aggressive lytic or sclerotic bone lesion identified.  CT ABDOMEN PELVIS FINDINGS  Hepatobiliary: No suspicious liver abnormality. Previous cholecystectomy. No significant biliary dilatation.  Pancreas: Normal appearance of the  pancreas.  Spleen: Spleen is normal.  Adrenals/Urinary Tract: There are bilateral adrenal lesions. Right adrenal nodule measure 1.5 cm, image 47/ series 2. Stable from previous exam. Left adrenal nodule measures 1.7 cm, image 47/ series 2. Also unchanged. Left renal cysts are again noted. Urinary bladder appears normal  Stomach/Bowel: Large hiatal hernia. The small bowel loops have a normal course and caliber. No obstruction. Multiple distal colonic diverticula identified.  Vascular/Lymphatic: Calcified atherosclerotic disease involves the abdominal aorta. No aneurysm. No enlarged retroperitoneal or mesenteric adenopathy. No enlarged pelvic or inguinal lymph nodes.  Reproductive: Previous hysterectomy.  No adnexal mass.  Other: No free fluid or fluid collections.  Musculoskeletal: Degenerative disc disease noted within the scratch set there is lumbar scoliosis and multi level degenerative disc disease. L1 compression fracture is unchanged from previous exam.  IMPRESSION: 1. The dominant left lower lobe lung mass is stable when compared with previous exam. There is been mild increase in volume of the left pleural effusion. 2. Similar appearance of innumerable pulmonary nodules throughout both lungs. 3. Stable to improved appearance of mediastinal adenopathy. 4. No change and bilateral adrenal nodules. 5. Hiatal hernia 6. Aortic atherosclerosis.   Electronically Signed   By: Kerby Moors M.D.   On: 06/09/2015 13:17   Ct Abdomen Pelvis W Contrast  06/09/2015   CLINICAL DATA:  Restaging lung  cancer  EXAM: CT CHEST, ABDOMEN, AND PELVIS WITH CONTRAST  TECHNIQUE: Multidetector CT imaging of the chest, abdomen and pelvis was performed following the standard protocol during bolus administration of intravenous contrast.  CONTRAST:  2m OMNIPAQUE IOHEXOL 300 MG/ML  SOLN  COMPARISON:  04/03/2015  FINDINGS: CT CHEST FINDINGS  Mediastinum/Nodes: The heart size is within normal limits. Aortic atherosclerosis noted. Calcifications within the LAD, left circumflex coronary arteries noted. The trachea is patent and is midline. Index right supraclavicular lymph node is stable measuring 7 mm. The index right paratracheal lymph node Measures 8 mm, image 20 of series 2. Previously 10 mm. The index sub- carinal lymph node Measures 10 mm, image 24/ series 2. Previously 12 mm.  Lungs/Pleura: There is a small to moderate left pleural effusion which is increased in volume from previous exam. The lower left lung mass measures 6.8 x 4.1 cm, image 32/ series 2. Previously 6.8 x 4.3 cm. Innumerable tiny nodules are again noted throughout both lungs. Too numerous to count. Subjectively, the this appears similar to the previous exam.  Musculoskeletal: Scoliosis and degenerative disc disease noted within the thoracic spine. There is no aggressive lytic or sclerotic bone lesion identified.  CT ABDOMEN PELVIS FINDINGS  Hepatobiliary: No suspicious liver abnormality. Previous cholecystectomy. No significant biliary dilatation.  Pancreas: Normal appearance of the pancreas.  Spleen: Spleen is normal.  Adrenals/Urinary Tract: There are bilateral adrenal lesions. Right adrenal nodule measure 1.5 cm, image 47/ series 2. Stable from previous exam. Left adrenal nodule measures 1.7 cm, image 47/ series 2. Also unchanged. Left renal cysts are again noted. Urinary bladder appears normal  Stomach/Bowel: Large hiatal hernia. The small bowel loops have a normal course and caliber. No obstruction. Multiple distal colonic diverticula identified.   Vascular/Lymphatic: Calcified atherosclerotic disease involves the abdominal aorta. No aneurysm. No enlarged retroperitoneal or mesenteric adenopathy. No enlarged pelvic or inguinal lymph nodes.  Reproductive: Previous hysterectomy.  No adnexal mass.  Other: No free fluid or fluid collections.  Musculoskeletal: Degenerative disc disease noted within the scratch set there is lumbar scoliosis and multi level degenerative disc disease. L1 compression fracture is unchanged from  previous exam.  IMPRESSION: 1. The dominant left lower lobe lung mass is stable when compared with previous exam. There is been mild increase in volume of the left pleural effusion. 2. Similar appearance of innumerable pulmonary nodules throughout both lungs. 3. Stable to improved appearance of mediastinal adenopathy. 4. No change and bilateral adrenal nodules. 5. Hiatal hernia 6. Aortic atherosclerosis.   Electronically Signed   By: Kerby Moors M.D.   On: 06/09/2015 13:17    ASSESSMENT AND PLAN: This is a very pleasant 81 years old white female with metastatic non-small cell lung cancer, adenocarcinoma with negative EGFR mutation and negative ALK gene translocation. She is currently undergoing systemic chemotherapy with carboplatin and Alimta status post 3 cycles.  The patient has been tolerating her treatment fairly well except for the fatigue from chemotherapy-induced anemia. Her recent CT scan of the chest, abdomen and pelvis showed no evidence for disease progression. I discussed the scan results with the patient and her niece. I recommended for her to continue her current systemic chemotherapy with carboplatin and Alimta but I will reduce the dose of carboplatin to AUC of 4 and Alimta at 375 MG/M2 every 3 weeks starting from cycle #4. For the chemotherapy-induced anemia, I will arrange for the patient to receive 2 units of PRBCs transfusion tomorrow. For insomnia, she will continue on Restoril 30 mg by mouth daily at  bedtime. The patient would come back for follow-up visit in 3 weeks for reevaluation before starting cycle #5. The patient was advised to call immediately if she has any concerning symptoms in the interval. The patient voices understanding of current disease status and treatment options and is in agreement with the current care plan.  All questions were answered. The patient knows to call the clinic with any problems, questions or concerns. We can certainly see the patient much sooner if necessary.  Disclaimer: This note was dictated with voice recognition software. Similar sounding words can inadvertently be transcribed and may not be corrected upon review.

## 2015-06-12 ENCOUNTER — Encounter: Payer: Self-pay | Admitting: Internal Medicine

## 2015-06-12 ENCOUNTER — Ambulatory Visit (HOSPITAL_COMMUNITY): Admission: RE | Admit: 2015-06-12 | Payer: Medicare Other | Source: Ambulatory Visit

## 2015-06-12 DIAGNOSIS — D6481 Anemia due to antineoplastic chemotherapy: Secondary | ICD-10-CM

## 2015-06-12 DIAGNOSIS — Z5111 Encounter for antineoplastic chemotherapy: Secondary | ICD-10-CM | POA: Insufficient documentation

## 2015-06-12 DIAGNOSIS — T451X5A Adverse effect of antineoplastic and immunosuppressive drugs, initial encounter: Secondary | ICD-10-CM

## 2015-06-12 HISTORY — DX: Anemia due to antineoplastic chemotherapy: D64.81

## 2015-06-12 LAB — TYPE AND SCREEN
ABO/RH(D): O POS
ANTIBODY SCREEN: NEGATIVE
UNIT DIVISION: 0
UNIT DIVISION: 0

## 2015-06-18 ENCOUNTER — Other Ambulatory Visit (HOSPITAL_BASED_OUTPATIENT_CLINIC_OR_DEPARTMENT_OTHER): Payer: Medicare Other

## 2015-06-18 DIAGNOSIS — C3432 Malignant neoplasm of lower lobe, left bronchus or lung: Secondary | ICD-10-CM

## 2015-06-18 LAB — CBC WITH DIFFERENTIAL/PLATELET
BASO%: 0.9 % (ref 0.0–2.0)
BASOS ABS: 0 10*3/uL (ref 0.0–0.1)
EOS%: 3.4 % (ref 0.0–7.0)
Eosinophils Absolute: 0.1 10*3/uL (ref 0.0–0.5)
HEMATOCRIT: 32 % — AB (ref 34.8–46.6)
HGB: 10.6 g/dL — ABNORMAL LOW (ref 11.6–15.9)
LYMPH#: 0.3 10*3/uL — AB (ref 0.9–3.3)
LYMPH%: 17.4 % (ref 14.0–49.7)
MCH: 30.3 pg (ref 25.1–34.0)
MCHC: 33 g/dL (ref 31.5–36.0)
MCV: 91.8 fL (ref 79.5–101.0)
MONO#: 0.2 10*3/uL (ref 0.1–0.9)
MONO%: 9.2 % (ref 0.0–14.0)
NEUT#: 1.3 10*3/uL — ABNORMAL LOW (ref 1.5–6.5)
NEUT%: 69.1 % (ref 38.4–76.8)
Platelets: 105 10*3/uL — ABNORMAL LOW (ref 145–400)
RBC: 3.48 10*6/uL — ABNORMAL LOW (ref 3.70–5.45)
RDW: 17.6 % — ABNORMAL HIGH (ref 11.2–14.5)
WBC: 1.9 10*3/uL — ABNORMAL LOW (ref 3.9–10.3)

## 2015-06-18 LAB — COMPREHENSIVE METABOLIC PANEL (CC13)
ALT: 11 U/L (ref 0–55)
AST: 16 U/L (ref 5–34)
Albumin: 3.2 g/dL — ABNORMAL LOW (ref 3.5–5.0)
Alkaline Phosphatase: 55 U/L (ref 40–150)
Anion Gap: 10 mEq/L (ref 3–11)
BUN: 22.6 mg/dL (ref 7.0–26.0)
CALCIUM: 9.4 mg/dL (ref 8.4–10.4)
CHLORIDE: 108 meq/L (ref 98–109)
CO2: 20 mEq/L — ABNORMAL LOW (ref 22–29)
Creatinine: 1.4 mg/dL — ABNORMAL HIGH (ref 0.6–1.1)
EGFR: 35 mL/min/{1.73_m2} — ABNORMAL LOW (ref >90)
Glucose: 197 mg/dl — ABNORMAL HIGH (ref 70–140)
POTASSIUM: 4.1 meq/L (ref 3.5–5.1)
Sodium: 139 mEq/L (ref 136–145)
Total Bilirubin: 0.48 mg/dL (ref 0.20–1.20)
Total Protein: 6.5 g/dL (ref 6.4–8.3)

## 2015-06-25 ENCOUNTER — Other Ambulatory Visit (HOSPITAL_BASED_OUTPATIENT_CLINIC_OR_DEPARTMENT_OTHER): Payer: Medicare Other

## 2015-06-25 DIAGNOSIS — C3432 Malignant neoplasm of lower lobe, left bronchus or lung: Secondary | ICD-10-CM

## 2015-06-25 LAB — CBC WITH DIFFERENTIAL/PLATELET
BASO%: 0.6 % (ref 0.0–2.0)
Basophils Absolute: 0 10*3/uL (ref 0.0–0.1)
EOS%: 6 % (ref 0.0–7.0)
Eosinophils Absolute: 0.2 10*3/uL (ref 0.0–0.5)
HCT: 30.2 % — ABNORMAL LOW (ref 34.8–46.6)
HGB: 10 g/dL — ABNORMAL LOW (ref 11.6–15.9)
LYMPH%: 14.1 % (ref 14.0–49.7)
MCH: 30.3 pg (ref 25.1–34.0)
MCHC: 33.1 g/dL (ref 31.5–36.0)
MCV: 91.8 fL (ref 79.5–101.0)
MONO#: 0.3 10*3/uL (ref 0.1–0.9)
MONO%: 10.2 % (ref 0.0–14.0)
NEUT%: 69.1 % (ref 38.4–76.8)
NEUTROS ABS: 1.9 10*3/uL (ref 1.5–6.5)
Platelets: 65 10*3/uL — ABNORMAL LOW (ref 145–400)
RBC: 3.29 10*6/uL — AB (ref 3.70–5.45)
RDW: 17 % — ABNORMAL HIGH (ref 11.2–14.5)
WBC: 2.8 10*3/uL — AB (ref 3.9–10.3)
lymph#: 0.4 10*3/uL — ABNORMAL LOW (ref 0.9–3.3)

## 2015-06-25 LAB — COMPREHENSIVE METABOLIC PANEL (CC13)
ALT: 14 U/L (ref 0–55)
AST: 19 U/L (ref 5–34)
Albumin: 3.3 g/dL — ABNORMAL LOW (ref 3.5–5.0)
Alkaline Phosphatase: 63 U/L (ref 40–150)
Anion Gap: 8 mEq/L (ref 3–11)
BUN: 21.3 mg/dL (ref 7.0–26.0)
CHLORIDE: 112 meq/L — AB (ref 98–109)
CO2: 23 mEq/L (ref 22–29)
Calcium: 9.8 mg/dL (ref 8.4–10.4)
Creatinine: 1.4 mg/dL — ABNORMAL HIGH (ref 0.6–1.1)
EGFR: 35 mL/min/{1.73_m2} — ABNORMAL LOW (ref >90)
GLUCOSE: 133 mg/dL (ref 70–140)
Potassium: 4.2 mEq/L (ref 3.5–5.1)
SODIUM: 143 meq/L (ref 136–145)
TOTAL PROTEIN: 6.8 g/dL (ref 6.4–8.3)

## 2015-07-01 ENCOUNTER — Other Ambulatory Visit: Payer: Self-pay | Admitting: Medical Oncology

## 2015-07-01 MED ORDER — TEMAZEPAM 30 MG PO CAPS: 30.0000 mg | ORAL_CAPSULE | Freq: Every evening | ORAL | Status: DC | PRN

## 2015-07-02 ENCOUNTER — Telehealth: Payer: Self-pay | Admitting: Internal Medicine

## 2015-07-02 ENCOUNTER — Ambulatory Visit (HOSPITAL_BASED_OUTPATIENT_CLINIC_OR_DEPARTMENT_OTHER): Payer: Medicare Other

## 2015-07-02 ENCOUNTER — Ambulatory Visit (HOSPITAL_BASED_OUTPATIENT_CLINIC_OR_DEPARTMENT_OTHER): Payer: Medicare Other | Admitting: Physician Assistant

## 2015-07-02 ENCOUNTER — Other Ambulatory Visit (HOSPITAL_BASED_OUTPATIENT_CLINIC_OR_DEPARTMENT_OTHER): Payer: Medicare Other

## 2015-07-02 VITALS — BP 171/53 | HR 69 | Temp 98.2°F | Resp 18 | Ht 63.0 in | Wt 144.5 lb

## 2015-07-02 VITALS — BP 158/56 | HR 82

## 2015-07-02 DIAGNOSIS — C3432 Malignant neoplasm of lower lobe, left bronchus or lung: Secondary | ICD-10-CM

## 2015-07-02 DIAGNOSIS — D6481 Anemia due to antineoplastic chemotherapy: Secondary | ICD-10-CM | POA: Diagnosis not present

## 2015-07-02 DIAGNOSIS — Z5111 Encounter for antineoplastic chemotherapy: Secondary | ICD-10-CM | POA: Diagnosis not present

## 2015-07-02 DIAGNOSIS — G4700 Insomnia, unspecified: Secondary | ICD-10-CM

## 2015-07-02 LAB — COMPREHENSIVE METABOLIC PANEL (CC13)
ALK PHOS: 62 U/L (ref 40–150)
ALT: 11 U/L (ref 0–55)
AST: 15 U/L (ref 5–34)
Albumin: 3.4 g/dL — ABNORMAL LOW (ref 3.5–5.0)
Anion Gap: 10 mEq/L (ref 3–11)
BUN: 28.6 mg/dL — ABNORMAL HIGH (ref 7.0–26.0)
CALCIUM: 9.7 mg/dL (ref 8.4–10.4)
CHLORIDE: 108 meq/L (ref 98–109)
CO2: 19 mEq/L — ABNORMAL LOW (ref 22–29)
Creatinine: 1.4 mg/dL — ABNORMAL HIGH (ref 0.6–1.1)
EGFR: 36 mL/min/{1.73_m2} — AB (ref >90)
GLUCOSE: 164 mg/dL — AB (ref 70–140)
POTASSIUM: 4.5 meq/L (ref 3.5–5.1)
SODIUM: 137 meq/L (ref 136–145)
Total Bilirubin: <0.3 mg/dL (ref 0.20–1.20)
Total Protein: 6.8 g/dL (ref 6.4–8.3)

## 2015-07-02 LAB — CBC WITH DIFFERENTIAL/PLATELET
BASO%: 0.4 % (ref 0.0–2.0)
BASOS ABS: 0 10*3/uL (ref 0.0–0.1)
EOS ABS: 0 10*3/uL (ref 0.0–0.5)
EOS%: 0.7 % (ref 0.0–7.0)
HCT: 28.2 % — ABNORMAL LOW (ref 34.8–46.6)
HEMOGLOBIN: 9.6 g/dL — AB (ref 11.6–15.9)
LYMPH%: 7.5 % — AB (ref 14.0–49.7)
MCH: 31.2 pg (ref 25.1–34.0)
MCHC: 34.1 g/dL (ref 31.5–36.0)
MCV: 91.7 fL (ref 79.5–101.0)
MONO#: 0.4 10*3/uL (ref 0.1–0.9)
MONO%: 6.7 % (ref 0.0–14.0)
NEUT#: 5 10*3/uL (ref 1.5–6.5)
NEUT%: 84.7 % — AB (ref 38.4–76.8)
Platelets: 160 10*3/uL (ref 145–400)
RBC: 3.07 10*6/uL — ABNORMAL LOW (ref 3.70–5.45)
RDW: 18.7 % — AB (ref 11.2–14.5)
WBC: 5.9 10*3/uL (ref 3.9–10.3)
lymph#: 0.4 10*3/uL — ABNORMAL LOW (ref 0.9–3.3)

## 2015-07-02 MED ORDER — SODIUM CHLORIDE 0.9 % IV SOLN
Freq: Once | INTRAVENOUS | Status: AC
  Administered 2015-07-02: 11:00:00 via INTRAVENOUS

## 2015-07-02 MED ORDER — SODIUM CHLORIDE 0.9 % IV SOLN
229.6000 mg | Freq: Once | INTRAVENOUS | Status: AC
  Administered 2015-07-02: 230 mg via INTRAVENOUS
  Filled 2015-07-02: qty 23

## 2015-07-02 MED ORDER — TEMAZEPAM 30 MG PO CAPS: 30.0000 mg | ORAL_CAPSULE | Freq: Every evening | ORAL | Status: DC | PRN

## 2015-07-02 MED ORDER — SODIUM CHLORIDE 0.9 % IV SOLN
375.0000 mg/m2 | Freq: Once | INTRAVENOUS | Status: AC
  Administered 2015-07-02: 650 mg via INTRAVENOUS
  Filled 2015-07-02: qty 22

## 2015-07-02 MED ORDER — SODIUM CHLORIDE 0.9 % IV SOLN
Freq: Once | INTRAVENOUS | Status: AC
  Administered 2015-07-02: 11:00:00 via INTRAVENOUS
  Filled 2015-07-02: qty 8

## 2015-07-02 NOTE — Patient Instructions (Signed)
[facility] Discharge Instructions for Patients Receiving Chemotherapy  Today you received the following chemotherapy agents: alimta, carboplatin  To help prevent nausea and vomiting after your treatment, we encourage you to take your nausea medication.  Take it as often as prescribed.     If you develop nausea and vomiting that is not controlled by your nausea medication, call the clinic. If it is after clinic hours your family physician or the after hours number for the clinic or go to the Emergency Department.   BELOW ARE SYMPTOMS THAT SHOULD BE REPORTED IMMEDIATELY:  *FEVER GREATER THAN 100.5 F  *CHILLS WITH OR WITHOUT FEVER  NAUSEA AND VOMITING THAT IS NOT CONTROLLED WITH YOUR NAUSEA MEDICATION  *UNUSUAL SHORTNESS OF BREATH  *UNUSUAL BRUISING OR BLEEDING  TENDERNESS IN MOUTH AND THROAT WITH OR WITHOUT PRESENCE OF ULCERS  *URINARY PROBLEMS  *BOWEL PROBLEMS  UNUSUAL RASH Items with * indicate a potential emergency and should be followed up as soon as possible.  Feel free to call the clinic you have any questions or concerns. The clinic phone number is [phone].   I have been informed and understand all the instructions given to me. I know to contact the clinic, my physician, or go to the Emergency Department if any problems should occur. I do not have any questions at this time, but understand that I may call the clinic during office hours   should I have any questions or need assistance in obtaining follow up care.    __________________________________________  _____________  __________ Signature of Patient or Authorized Representative            Date                   Time    __________________________________________ Nurse's Signature   Blood Transfusion Information WHAT IS A BLOOD TRANSFUSION? A transfusion is the replacement of blood or some of its parts. Blood is made up of multiple cells which provide different functions.  Red blood  cells carry oxygen and are used for blood loss replacement.  White blood cells fight against infection.  Platelets control bleeding.  Plasma helps clot blood.  Other blood products are available for specialized needs, such as hemophilia or other clotting disorders. BEFORE THE TRANSFUSION  Who gives blood for transfusions?   You may be able to donate blood to be used at a later date on yourself (autologous donation).  Relatives can be asked to donate blood. This is generally not any safer than if you have received blood from a stranger. The same precautions are taken to ensure safety when a relative's blood is donated.  Healthy volunteers who are fully evaluated to make sure their blood is safe. This is blood bank blood. Transfusion therapy is the safest it has ever been in the practice of medicine. Before blood is taken from a donor, a complete history is taken to make sure that person has no history of diseases nor engages in risky social behavior (examples are intravenous drug use or sexual activity with multiple partners). The donor's travel history is screened to minimize risk of transmitting infections, such as malaria. The donated blood is tested for signs of infectious diseases, such as HIV and hepatitis. The blood is then tested to be sure it is compatible with you in order to minimize the chance of a transfusion reaction. If you or a relative donates blood, this is often done in anticipation of surgery and is not appropriate for emergency  situations. It takes many days to process the donated blood. RISKS AND COMPLICATIONS Although transfusion therapy is very safe and saves many lives, the main dangers of transfusion include:   Getting an infectious disease.  Developing a transfusion reaction. This is an allergic reaction to something in the blood you were given. Every precaution is taken to prevent this. The decision to have a blood transfusion has been considered carefully by your  caregiver before blood is given. Blood is not given unless the benefits outweigh the risks. AFTER THE TRANSFUSION  Right after receiving a blood transfusion, you will usually feel much better and more energetic. This is especially true if your red blood cells have gotten low (anemic). The transfusion raises the level of the red blood cells which carry oxygen, and this usually causes an energy increase.  The nurse administering the transfusion will monitor you carefully for complications. HOME CARE INSTRUCTIONS  No special instructions are needed after a transfusion. You may find your energy is better. Speak with your caregiver about any limitations on activity for underlying diseases you may have. SEEK MEDICAL CARE IF:   Your condition is not improving after your transfusion.  You develop redness or irritation at the intravenous (IV) site. SEEK IMMEDIATE MEDICAL CARE IF:  Any of the following symptoms occur over the next 12 hours:  Shaking chills.  You have a temperature by mouth above 102 F (38.9 C), not controlled by medicine.  Chest, back, or muscle pain.  People around you feel you are not acting correctly or are confused.  Shortness of breath or difficulty breathing.  Dizziness and fainting.  You get a rash or develop hives.  You have a decrease in urine output.  Your urine turns a dark color or changes to pink, red, or brown. Any of the following symptoms occur over the next 10 days:  You have a temperature by mouth above 102 F (38.9 C), not controlled by medicine.  Shortness of breath.  Weakness after normal activity.  The white part of the eye turns yellow (jaundice).  You have a decrease in the amount of urine or are urinating less often.  Your urine turns a dark color or changes to pink, red, or brown. Document Released: 08/26/2000 Document Revised: 11/21/2011 Document Reviewed: 04/14/2008 [facility] Patient Information 2015 [place], [place]. This information  is not intended to replace advice given to you by your health care provider. Make sure you discuss any questions you have with your health care provider.

## 2015-07-02 NOTE — Progress Notes (Signed)
[facility] [phone]   Fax:([phone]) [place], [place] [address]  DIAGNOSIS: Metastatic non-small cell lung cancer, adenocarcinoma with negative EGFR mutation and negative gene translocation initially diagnosed as stage IB in February 2014 with recurrence in March 2016.   PRIOR THERAPY: Status post stereotactic radiotherapy under the care of Dr. Lisbeth Renshaw completed 01/10/2013.  CURRENT THERAPY: Systemic chemotherapy with carboplatin for AUC of 4 and Alimta 400 MG/M2 every 3 weeks. First dose 04/06/2015. Status post 4 cycles.  On Cycle 4, systemic chemotherapy with carboplatin and Alimta was dose reduced with carboplatin to AUC of 4 and Alimta at 375 MG/M2 every 3 weeks due to  chemotherapy induced anemia.   INTERVAL HISTORY: Stephanie Fletcher 81 y.o. female returns to the clinic today for follow-up visit accompanied by a family member. The patient is doing fine today with no specific complaints except for one episode of self resolved nose bleed about one week ago with no recurrence.  She is tolerating her treatment with dose reduced carboplatin and Alimta fairly well except for mild fatigue. She denied having any significant fever or chills. The patient denied having any chest pain or cough. She denied having any nausea or vomiting. No other bleeding issues are reported. Appetite is normal. She has some insomnia which is controlled with meds. She is requesting refills today as she has ran out of her sleeping pill.  MEDICAL HISTORY: Past Medical History  Diagnosis Date  . Hypercholesterolemia   . Hypertension   . Diverticulitis   . Gastric ulcer   . Arthritis   . Anemia   . Back injury 1998    T-12 fracture (brace)  . Nodule of left lung   . GERD (gastroesophageal reflux disease)   . History of radiation therapy 12/31/2012-01/10/2013    60 gray to left lower lung  .  Peripheral vascular disease 1957/1997    DVT bilateral following fall  . Hx of cancer of lung 2014    tx with radiation only  . Numbness     left lower leg since left total knee nerve damage  . History of kidney stones     x1 attack many yrs ago  . Difficulty sleeping   . Cancer 2014    adenocarcinoma/  lung  . Diabetes mellitus without complication     type 2-metformin  . Antineoplastic chemotherapy induced anemia 06/12/2015    ALLERGIES:  is allergic to actos and fosamax.  MEDICATIONS:  Current Outpatient Prescriptions  Medication Sig Dispense Refill  . acetaminophen (TYLENOL) 500 MG tablet Take 1,000 mg by mouth at bedtime as needed for mild pain.    [name] aspirin EC 81 MG tablet Take 81 mg by mouth.    . B-D UF III MINI PEN NEEDLES 31G X 5 MM MISC USE AS DIRECTED WITH HUMALOG PEN  0  . bisacodyl (DULCOLAX) 10 MG suppository Place 1 suppository (10 mg total) rectally daily as needed for moderate constipation. 12 suppository 0  . CARBOplatin (PARAPLATIN) 150 MG injection Inject into the vein.    . cyanocobalamin (,VITAMIN B-12,) 1000 MCG/ML injection 1,000 mcg.    [name] dexamethasone (DECADRON) 4 MG tablet 4 mg po bid the day before, day of and day after chemo 40 tablet 1  . DHA-EPA-VITAMIN E PO Take by mouth.    . dicyclomine (BENTYL) 20 MG tablet Take 20 mg by mouth every 6 (six)  hours as needed (for stomach).    . docusate sodium (COLACE) 100 MG capsule Take 1 capsule (100 mg total) by mouth 2 (two) times daily. 10 capsule 0  . folic acid (FOLVITE) 1 MG tablet Take 1 tablet (1 mg total) by mouth daily. 30 tablet 4  . glimepiride (AMARYL) 2 MG tablet Take 2 mg by mouth daily with breakfast.    . glucose blood (ONE TOUCH ULTRA TEST) test strip Use 2 (two) times daily. Use as instructed.    [name] HUMALOG KWIKPEN 100 UNIT/ML KiwkPen See admin instructions.  0  . menthol-cetylpyridinium (CEPACOL) 3 MG lozenge Take 1 lozenge by mouth as needed for sore throat.    . metFORMIN (GLUCOPHAGE) 500 MG  tablet Take 500-1,000 mg by mouth 3 (three) times daily. Take 2 tablets (1000 mg) every morning, 1 tablet (500 mg) at noon, 1 tablet (500 mg) every evening    . Multiple Vitamins-Minerals (PRESERVISION AREDS) TABS Take 1 each by mouth.    . ondansetron (ZOFRAN) 4 MG tablet Take 1 tablet (4 mg total) by mouth every 6 (six) hours as needed for nausea. 40 tablet 0  . pantoprazole (PROTONIX) 40 MG tablet Take 40 mg by mouth daily.    [name] PEMEtrexed (ALIMTA) 500 MG injection Inject into the vein.    . pioglitazone (ACTOS) 15 MG tablet Take 15 mg by mouth daily with breakfast.     . polyethylene glycol (MIRALAX / GLYCOLAX) packet Take 17 g by mouth daily as needed (constipation).    . pravastatin (PRAVACHOL) 20 MG tablet Take 20 mg by mouth at bedtime.    . prochlorperazine (COMPAZINE) 10 MG tablet Take 1 tablet (10 mg total) by mouth every 6 (six) hours as needed for nausea or vomiting. 30 tablet 0  . sitaGLIPtin (JANUVIA) 100 MG tablet Take 100 mg by mouth daily with breakfast.     . temazepam (RESTORIL) 30 MG capsule Take 1 capsule (30 mg total) by mouth at bedtime as needed for sleep. 30 capsule 0  . valsartan-hydrochlorothiazide (DIOVAN HCT) 160-25 MG per tablet Take 1 tablet by mouth every morning.     No current facility-administered medications for this visit.    SURGICAL HISTORY:  Past Surgical History  Procedure Laterality Date  . Abdominal hysterectomy  1979  . Hernia repair Right 1972  . Eye surgery Bilateral 2007, 2008    cataracts  . Appendectomy  1949  . Breast biopsy Bilateral [id number]  . Carpal tunnel release Bilateral 1997  . Skin graft  1955    right groin  . Cholecystectomy    . Video bronchoscopy with endobronchial navigation N/A 11/22/2012    Procedure: VIDEO BRONCHOSCOPY WITH ENDOBRONCHIAL NAVIGATION;  Surgeon: Melrose Nakayama, MD;  Location: [facility];  Service: Thoracic;  Laterality: N/A;  . Total knee arthroplasty Left 04/22/2013    Procedure: LEFT TOTAL KNEE  ARTHROPLASTY;  Surgeon: Gearlean Alf, MD;  Location: [facility];  Service: Orthopedics;  Laterality: Left;  . Superficial peroneal nerve release Left 07/19/2013    Procedure: LEFT PERONEAL NERVE DECOMPRESSION ;  Surgeon: Gearlean Alf, MD;  Location: [facility];  Service: Orthopedics;  Laterality: Left;  . Total knee arthroplasty Right 10/06/2014    Procedure: RIGHT TOTAL KNEE ARTHROPLASTY;  Surgeon: Gearlean Alf, MD;  Location: [facility];  Service: Orthopedics;  Laterality: Right;    REVIEW OF SYSTEMS:  Constitutional: positive for mild fatigue Eyes: negative Ears, nose, mouth, throat, and face: negative Respiratory: positive for dyspnea on  exertion Cardiovascular: negative Gastrointestinal: negative Genitourinary:negative Integument/breast: negative Hematologic/lymphatic: had one episode of nose bleeds one week ago, self resolved Musculoskeletal:negative Neurological: negative Behavioral/Psych: has some insomnia controlled with meds. Endocrine: negative Allergic/Immunologic: negative   PHYSICAL EXAMINATION: General appearance: alert, cooperative and no distress Head: Normocephalic, without obvious abnormality, atraumatic Neck: no adenopathy, no JVD, supple, symmetrical, trachea midline and thyroid not enlarged, symmetric, no tenderness/mass/nodules Lymph nodes: Cervical, supraclavicular, and axillary nodes normal. Resp: clear to auscultation bilaterally Back: symmetric, no curvature. ROM normal. No CVA tenderness. Cardio: regular rate and rhythm, S1, S2 normal, no murmur, click, rub or gallop GI: soft, non-tender; bowel sounds normal; no masses,  no organomegaly Extremities: extremities normal, atraumatic, no cyanosis or edema Neurologic: Alert and oriented X 3, normal strength and tone. Normal symmetric reflexes. Normal coordination and gait  ECOG PERFORMANCE STATUS: 1 - Symptomatic but completely ambulatory  Blood pressure 171/53, pulse 69, temperature 98.2 F (36.8 C), temperature  source Oral, resp. rate 18, height '5\' 3"'  (1.6 m), weight 144 lb 8 oz (65.545 kg), SpO2 100 %.  LABORATORY DATA: Lab Results  Component Value Date   WBC 5.9 07/02/2015   HGB 9.6* 07/02/2015   HCT 28.2* 07/02/2015   MCV 91.7 07/02/2015   PLT 160 07/02/2015      Chemistry      Component Value Date/Time   NA 137 07/02/2015 0921   NA 135 05/12/2015 2201   NA 141 07/04/2014 1816   K 4.5 07/02/2015 0921   K 4.7 05/12/2015 2201   K 4.6 07/04/2014 1816   CL 105 05/12/2015 2201   CL 108* 07/04/2014 1816   CO2 19* 07/02/2015 0921   CO2 24 05/12/2015 2201   CO2 28 07/04/2014 1816   BUN 28.6* 07/02/2015 0921   BUN 35* 05/12/2015 2201   BUN 35* 07/04/2014 1816   CREATININE 1.4* 07/02/2015 0921   CREATININE 1.18* 05/12/2015 2201   CREATININE 1.01 07/04/2014 1816      Component Value Date/Time   CALCIUM 9.7 07/02/2015 0921   CALCIUM 8.5* 05/12/2015 2201   CALCIUM 9.1 07/04/2014 1816   ALKPHOS 62 07/02/2015 0921   ALKPHOS 51 09/29/2014 1410   ALKPHOS 52 07/04/2014 1816   AST 15 07/02/2015 0921   AST 18 09/29/2014 1410   AST 15 07/04/2014 1816   ALT 11 07/02/2015 0921   ALT 11 09/29/2014 1410   ALT 18 07/04/2014 1816   BILITOT <0.30 07/02/2015 0921   BILITOT 0.3 09/29/2014 1410   BILITOT 0.1* 07/04/2014 1816        ASSESSMENT AND PLAN: This is a very pleasant 81 years old white female with  Metastatic non-small cell lung cancer  adenocarcinoma with negative EGFR mutation and negative ALK gene translocation. She is currently undergoing systemic chemotherapy with carboplatin and Alimta status post 4 cycles.  The patient has been tolerating her treatment fairly well except for the fatigue from chemotherapy-induced anemia.  Her recent CT scan of the chest, abdomen and pelvis on 9/27 showed no evidence for disease progression. On Cycle 4, systemic chemotherapy with carboplatin and Alimta was dose reduced with carboplatin to AUC of 4 and Alimta at 375 MG/M2 every 3 weeks due to  chemotherapy induced anemia. Will proceed with Day 1 Cycle 5 today and every 3 weeks  Anemia in neoplastic disease In the setting of chemotherapy Chemo dose was reduced as of Cycle 4, on 9/29 She received 2 units of PRBCs transfusion on 9/30 with good response She had some nose bleeds one  week ago which have self resolved and no recurrence was reported. Will continue to closely monitor She will return for weekly labs  Insomnia Better controlled with Restoril 30 mg by mouth daily at bedtime. Will refill today as she has completed her prescription   The patient would come back for follow-up visit in 3 weeks for reevaluation before starting cycle #6 on 11/10.  The patient was advised to call immediately if she has any concerning symptoms in the interval. The patient voices understanding of current disease status and treatment options and is in agreement with the current care plan.  All questions were answered. The patient knows to call the clinic with any problems, questions or concerns. We can certainly see the patient much sooner if necessary.  ADDENDUM: Hematology/Oncology Attending: I had a face to face encounter with the patient today. I recommended her care plan. This is a very pleasant 81 years old white female with metastatic non-small cell lung cancer, adenocarcinoma who is currently undergoing systemic chemotherapy with carboplatin and Alimta status post 4 cycles. She tolerated the last cycle of her treatment fairly well especially after reducing the dose of carboplatin and Alimta. She denied having any significant nausea or vomiting. She had few episodes of epistaxis that resolved spontaneously. I recommended for the patient to proceed with cycle #5 today as scheduled. She will come back for follow-up visit in 3 weeks for reevaluation before starting cycle #6. The patient was advised to call immediately if she has any concerning symptoms in the interval.  Disclaimer: This note  was dictated with voice recognition software. Similar sounding words can inadvertently be transcribed and may be missed upon review. Eilleen Kempf., MD 07/02/2015

## 2015-07-02 NOTE — Telephone Encounter (Signed)
GAVE ADN PIRNTED APPT SCHED AND AVS FO RPT FOR oct AND NOV

## 2015-07-08 ENCOUNTER — Other Ambulatory Visit: Payer: Self-pay | Admitting: *Deleted

## 2015-07-08 DIAGNOSIS — C3430 Malignant neoplasm of lower lobe, unspecified bronchus or lung: Secondary | ICD-10-CM

## 2015-07-09 ENCOUNTER — Other Ambulatory Visit (HOSPITAL_BASED_OUTPATIENT_CLINIC_OR_DEPARTMENT_OTHER): Payer: Medicare Other

## 2015-07-09 DIAGNOSIS — C3430 Malignant neoplasm of lower lobe, unspecified bronchus or lung: Secondary | ICD-10-CM

## 2015-07-09 DIAGNOSIS — C3432 Malignant neoplasm of lower lobe, left bronchus or lung: Secondary | ICD-10-CM | POA: Diagnosis not present

## 2015-07-09 LAB — COMPREHENSIVE METABOLIC PANEL (CC13)
ALT: <9 U/L (ref 0–55)
AST: 15 U/L (ref 5–34)
Albumin: 3.2 g/dL — ABNORMAL LOW (ref 3.5–5.0)
Alkaline Phosphatase: 61 U/L (ref 40–150)
Anion Gap: 9 mEq/L (ref 3–11)
BUN: 30.1 mg/dL — ABNORMAL HIGH (ref 7.0–26.0)
CHLORIDE: 108 meq/L (ref 98–109)
CO2: 20 meq/L — AB (ref 22–29)
Calcium: 9.2 mg/dL (ref 8.4–10.4)
Creatinine: 1.6 mg/dL — ABNORMAL HIGH (ref 0.6–1.1)
EGFR: 29 mL/min/{1.73_m2} — AB (ref >90)
GLUCOSE: 256 mg/dL — AB (ref 70–140)
POTASSIUM: 4.2 meq/L (ref 3.5–5.1)
SODIUM: 137 meq/L (ref 136–145)
Total Bilirubin: 0.49 mg/dL (ref 0.20–1.20)
Total Protein: 6.7 g/dL (ref 6.4–8.3)

## 2015-07-09 LAB — CBC WITH DIFFERENTIAL/PLATELET
BASO%: 0.9 % (ref 0.0–2.0)
BASOS ABS: 0 10*3/uL (ref 0.0–0.1)
EOS ABS: 0 10*3/uL (ref 0.0–0.5)
EOS%: 4.8 % (ref 0.0–7.0)
HCT: 27.5 % — ABNORMAL LOW (ref 34.8–46.6)
HGB: 9 g/dL — ABNORMAL LOW (ref 11.6–15.9)
LYMPH%: 24.4 % (ref 14.0–49.7)
MCH: 30.9 pg (ref 25.1–34.0)
MCHC: 32.8 g/dL (ref 31.5–36.0)
MCV: 94.2 fL (ref 79.5–101.0)
MONO#: 0 10*3/uL — ABNORMAL LOW (ref 0.1–0.9)
MONO%: 4.2 % (ref 0.0–14.0)
NEUT%: 65.7 % (ref 38.4–76.8)
NEUTROS ABS: 0.6 10*3/uL — AB (ref 1.5–6.5)
Platelets: 103 10*3/uL — ABNORMAL LOW (ref 145–400)
RBC: 2.92 10*6/uL — AB (ref 3.70–5.45)
RDW: 18.9 % — ABNORMAL HIGH (ref 11.2–14.5)
WBC: 1 10*3/uL — AB (ref 3.9–10.3)
lymph#: 0.2 10*3/uL — ABNORMAL LOW (ref 0.9–3.3)

## 2015-07-16 ENCOUNTER — Other Ambulatory Visit (HOSPITAL_BASED_OUTPATIENT_CLINIC_OR_DEPARTMENT_OTHER): Payer: Medicare Other

## 2015-07-16 DIAGNOSIS — C3432 Malignant neoplasm of lower lobe, left bronchus or lung: Secondary | ICD-10-CM | POA: Diagnosis not present

## 2015-07-16 LAB — CBC WITH DIFFERENTIAL/PLATELET
BASO%: 0.2 % (ref 0.0–2.0)
Basophils Absolute: 0 10*3/uL (ref 0.0–0.1)
EOS ABS: 0.1 10*3/uL (ref 0.0–0.5)
EOS%: 6.2 % (ref 0.0–7.0)
HCT: 22.2 % — ABNORMAL LOW (ref 34.8–46.6)
HGB: 7.4 g/dL — ABNORMAL LOW (ref 11.6–15.9)
LYMPH%: 16.7 % (ref 14.0–49.7)
MCH: 31.2 pg (ref 25.1–34.0)
MCHC: 33.3 g/dL (ref 31.5–36.0)
MCV: 93.7 fL (ref 79.5–101.0)
MONO#: 0.2 10*3/uL (ref 0.1–0.9)
MONO%: 12.7 % (ref 0.0–14.0)
NEUT%: 64.2 % (ref 38.4–76.8)
NEUTROS ABS: 1.1 10*3/uL — AB (ref 1.5–6.5)
PLATELETS: 38 10*3/uL — AB (ref 145–400)
RBC: 2.37 10*6/uL — AB (ref 3.70–5.45)
RDW: 18.3 % — ABNORMAL HIGH (ref 11.2–14.5)
WBC: 1.7 10*3/uL — AB (ref 3.9–10.3)
lymph#: 0.3 10*3/uL — ABNORMAL LOW (ref 0.9–3.3)

## 2015-07-16 LAB — COMPREHENSIVE METABOLIC PANEL (CC13)
ALT: 12 U/L (ref 0–55)
ANION GAP: 7 meq/L (ref 3–11)
AST: 18 U/L (ref 5–34)
Albumin: 2.9 g/dL — ABNORMAL LOW (ref 3.5–5.0)
Alkaline Phosphatase: 59 U/L (ref 40–150)
BUN: 29.2 mg/dL — ABNORMAL HIGH (ref 7.0–26.0)
CALCIUM: 9 mg/dL (ref 8.4–10.4)
CO2: 19 meq/L — AB (ref 22–29)
Chloride: 111 mEq/L — ABNORMAL HIGH (ref 98–109)
Creatinine: 1.5 mg/dL — ABNORMAL HIGH (ref 0.6–1.1)
EGFR: 32 mL/min/{1.73_m2} — AB (ref >90)
Glucose: 103 mg/dl (ref 70–140)
Potassium: 4.4 mEq/L (ref 3.5–5.1)
Sodium: 137 mEq/L (ref 136–145)
TOTAL PROTEIN: 6.1 g/dL — AB (ref 6.4–8.3)

## 2015-07-16 NOTE — Progress Notes (Signed)
Quick Note:  Call patient with the result and arrange for 2 units of PRBCs. ______ 

## 2015-07-17 ENCOUNTER — Ambulatory Visit (HOSPITAL_BASED_OUTPATIENT_CLINIC_OR_DEPARTMENT_OTHER): Payer: Medicare Other

## 2015-07-17 ENCOUNTER — Telehealth: Payer: Self-pay | Admitting: *Deleted

## 2015-07-17 ENCOUNTER — Other Ambulatory Visit: Payer: Medicare Other

## 2015-07-17 ENCOUNTER — Other Ambulatory Visit: Payer: Self-pay | Admitting: *Deleted

## 2015-07-17 ENCOUNTER — Ambulatory Visit (HOSPITAL_COMMUNITY)
Admission: RE | Admit: 2015-07-17 | Discharge: 2015-07-17 | Disposition: A | Discharge status: Home / Self Care | Payer: Medicare Other | Source: Ambulatory Visit | Attending: Internal Medicine | Admitting: Internal Medicine

## 2015-07-17 VITALS — BP 162/53 | HR 60 | Temp 97.4°F | Resp 18

## 2015-07-17 DIAGNOSIS — D6489 Other specified anemias: Secondary | ICD-10-CM

## 2015-07-17 DIAGNOSIS — D6481 Anemia due to antineoplastic chemotherapy: Secondary | ICD-10-CM

## 2015-07-17 LAB — PREPARE RBC (CROSSMATCH)

## 2015-07-17 MED ORDER — DIPHENHYDRAMINE HCL 25 MG PO CAPS
25.0000 mg | ORAL_CAPSULE | Freq: Once | ORAL | Status: AC
  Administered 2015-07-17: 25 mg via ORAL

## 2015-07-17 MED ORDER — ACETAMINOPHEN 325 MG PO TABS
ORAL_TABLET | ORAL | Status: AC
  Filled 2015-07-17: qty 2

## 2015-07-17 MED ORDER — SODIUM CHLORIDE 0.9 % IV SOLN
250.0000 mL | Freq: Once | INTRAVENOUS | Status: AC
  Administered 2015-07-17: 250 mL via INTRAVENOUS

## 2015-07-17 MED ORDER — DIPHENHYDRAMINE HCL 25 MG PO CAPS
ORAL_CAPSULE | ORAL | Status: AC
  Filled 2015-07-17: qty 1

## 2015-07-17 MED ORDER — ACETAMINOPHEN 325 MG PO TABS
650.0000 mg | ORAL_TABLET | Freq: Once | ORAL | Status: AC
  Administered 2015-07-17: 650 mg via ORAL

## 2015-07-17 NOTE — Patient Instructions (Signed)

## 2015-07-17 NOTE — Telephone Encounter (Signed)
Called pt per MD, notified Hgb low will need 2 units RBC's today. Pt is scheduled for lab at 1130 and infusion at 1pm for 2 units. Pt confirmed and verbalized understanding. POF completed

## 2015-07-17 NOTE — Progress Notes (Signed)
HAR entered

## 2015-07-19 LAB — TYPE AND SCREEN
ABO/RH(D): O POS
ANTIBODY SCREEN: NEGATIVE
UNIT DIVISION: 0
UNIT DIVISION: 0

## 2015-07-23 ENCOUNTER — Telehealth: Payer: Self-pay | Admitting: Internal Medicine

## 2015-07-23 ENCOUNTER — Ambulatory Visit (HOSPITAL_BASED_OUTPATIENT_CLINIC_OR_DEPARTMENT_OTHER): Payer: Medicare Other

## 2015-07-23 ENCOUNTER — Ambulatory Visit (HOSPITAL_BASED_OUTPATIENT_CLINIC_OR_DEPARTMENT_OTHER): Payer: Medicare Other | Admitting: Internal Medicine

## 2015-07-23 ENCOUNTER — Ambulatory Visit: Payer: Medicare Other

## 2015-07-23 ENCOUNTER — Encounter: Payer: Self-pay | Admitting: Internal Medicine

## 2015-07-23 ENCOUNTER — Other Ambulatory Visit (HOSPITAL_BASED_OUTPATIENT_CLINIC_OR_DEPARTMENT_OTHER): Payer: Medicare Other

## 2015-07-23 VITALS — BP 183/80 | HR 59 | Temp 98.0°F | Resp 18 | Ht 63.0 in | Wt 143.1 lb

## 2015-07-23 DIAGNOSIS — Z23 Encounter for immunization: Secondary | ICD-10-CM

## 2015-07-23 DIAGNOSIS — G4700 Insomnia, unspecified: Secondary | ICD-10-CM | POA: Diagnosis not present

## 2015-07-23 DIAGNOSIS — C3490 Malignant neoplasm of unspecified part of unspecified bronchus or lung: Secondary | ICD-10-CM

## 2015-07-23 DIAGNOSIS — C3432 Malignant neoplasm of lower lobe, left bronchus or lung: Secondary | ICD-10-CM

## 2015-07-23 DIAGNOSIS — T451X5A Adverse effect of antineoplastic and immunosuppressive drugs, initial encounter: Secondary | ICD-10-CM

## 2015-07-23 DIAGNOSIS — C3430 Malignant neoplasm of lower lobe, unspecified bronchus or lung: Secondary | ICD-10-CM

## 2015-07-23 DIAGNOSIS — D6481 Anemia due to antineoplastic chemotherapy: Secondary | ICD-10-CM | POA: Diagnosis not present

## 2015-07-23 DIAGNOSIS — R0602 Shortness of breath: Secondary | ICD-10-CM | POA: Diagnosis not present

## 2015-07-23 DIAGNOSIS — Z5111 Encounter for antineoplastic chemotherapy: Secondary | ICD-10-CM

## 2015-07-23 DIAGNOSIS — R5383 Other fatigue: Secondary | ICD-10-CM

## 2015-07-23 LAB — CBC WITH DIFFERENTIAL/PLATELET
BASO%: 0.4 % (ref 0.0–2.0)
BASOS ABS: 0 10*3/uL (ref 0.0–0.1)
EOS ABS: 0.1 10*3/uL (ref 0.0–0.5)
EOS%: 1.3 % (ref 0.0–7.0)
HCT: 32.7 % — ABNORMAL LOW (ref 34.8–46.6)
HEMOGLOBIN: 10.8 g/dL — AB (ref 11.6–15.9)
LYMPH#: 0.5 10*3/uL — AB (ref 0.9–3.3)
LYMPH%: 9.5 % — ABNORMAL LOW (ref 14.0–49.7)
MCH: 30.7 pg (ref 25.1–34.0)
MCHC: 33.2 g/dL (ref 31.5–36.0)
MCV: 92.6 fL (ref 79.5–101.0)
MONO#: 0.5 10*3/uL (ref 0.1–0.9)
MONO%: 10.2 % (ref 0.0–14.0)
NEUT%: 78.6 % — ABNORMAL HIGH (ref 38.4–76.8)
NEUTROS ABS: 4.2 10*3/uL (ref 1.5–6.5)
Platelets: 192 10*3/uL (ref 145–400)
RBC: 3.53 10*6/uL — AB (ref 3.70–5.45)
RDW: 16.9 % — AB (ref 11.2–14.5)
WBC: 5.4 10*3/uL (ref 3.9–10.3)

## 2015-07-23 LAB — COMPREHENSIVE METABOLIC PANEL (CC13)
ALBUMIN: 3 g/dL — AB (ref 3.5–5.0)
ALT: 9 U/L (ref 0–55)
AST: 15 U/L (ref 5–34)
Alkaline Phosphatase: 63 U/L (ref 40–150)
Anion Gap: 11 mEq/L (ref 3–11)
BUN: 28.9 mg/dL — AB (ref 7.0–26.0)
CO2: 17 meq/L — AB (ref 22–29)
CREATININE: 1.5 mg/dL — AB (ref 0.6–1.1)
Calcium: 9.1 mg/dL (ref 8.4–10.4)
Chloride: 109 mEq/L (ref 98–109)
EGFR: 32 mL/min/{1.73_m2} — AB (ref >90)
GLUCOSE: 176 mg/dL — AB (ref 70–140)
Potassium: 4.2 mEq/L (ref 3.5–5.1)
SODIUM: 137 meq/L (ref 136–145)
TOTAL PROTEIN: 6.2 g/dL — AB (ref 6.4–8.3)

## 2015-07-23 MED ORDER — SODIUM CHLORIDE 0.9 % IV SOLN
Freq: Once | INTRAVENOUS | Status: AC
  Administered 2015-07-23: 10:00:00 via INTRAVENOUS
  Filled 2015-07-23: qty 8

## 2015-07-23 MED ORDER — CYANOCOBALAMIN 1000 MCG/ML IJ SOLN
INTRAMUSCULAR | Status: AC
  Filled 2015-07-23: qty 1

## 2015-07-23 MED ORDER — CYANOCOBALAMIN 1000 MCG/ML IJ SOLN
1000.0000 ug | Freq: Once | INTRAMUSCULAR | Status: AC
  Administered 2015-07-23: 1000 ug via INTRAMUSCULAR

## 2015-07-23 MED ORDER — HEPARIN SOD (PORK) LOCK FLUSH 100 UNIT/ML IV SOLN
500.0000 [IU] | Freq: Once | INTRAVENOUS | Status: DC | PRN
  Filled 2015-07-23: qty 5

## 2015-07-23 MED ORDER — SODIUM CHLORIDE 0.9 % IV SOLN
375.0000 mg/m2 | Freq: Once | INTRAVENOUS | Status: AC
  Administered 2015-07-23: 650 mg via INTRAVENOUS
  Filled 2015-07-23: qty 26

## 2015-07-23 MED ORDER — SODIUM CHLORIDE 0.9 % IV SOLN
Freq: Once | INTRAVENOUS | Status: AC
  Administered 2015-07-23: 10:00:00 via INTRAVENOUS

## 2015-07-23 MED ORDER — SODIUM CHLORIDE 0.9 % IV SOLN
220.0000 mg | Freq: Once | INTRAVENOUS | Status: AC
  Administered 2015-07-23: 220 mg via INTRAVENOUS
  Filled 2015-07-23: qty 22

## 2015-07-23 MED ORDER — SODIUM CHLORIDE 0.9 % IJ SOLN
10.0000 mL | INTRAMUSCULAR | Status: DC | PRN
  Filled 2015-07-23: qty 10

## 2015-07-23 MED ORDER — INFLUENZA VAC SPLIT QUAD 0.5 ML IM SUSY
0.5000 mL | PREFILLED_SYRINGE | Freq: Once | INTRAMUSCULAR | Status: AC
  Administered 2015-07-23: 0.5 mL via INTRAMUSCULAR
  Filled 2015-07-23: qty 0.5

## 2015-07-23 NOTE — Progress Notes (Signed)
Flu injection given by the infusion nurse after treatment.

## 2015-07-23 NOTE — Progress Notes (Signed)
[facility] [phone]   Fax:([phone]) [place], MD [place] [address]  DIAGNOSIS: Metastatic non-small cell lung cancer, adenocarcinoma with negative EGFR mutation and negative gene translocation initially diagnosed as stage IB in February 2014 with recurrence in March 2016.   PRIOR THERAPY:  1) Status post stereotactic radiotherapy under the care of Dr. Lisbeth Renshaw completed 01/10/2013. 2) Systemic chemotherapy with carboplatin for AUC of 4 and Alimta 400 MG/M2 every 3 weeks. First dose 04/06/2015. Status post 3 cycles.  CURRENT THERAPY: Systemic chemotherapy with carboplatin for AUC of 4 and Alimta 400 MG/M2 every 3 weeks. First dose 04/06/2015. Status post 5 cycles.  INTERVAL HISTORY: Stephanie Fletcher 81 y.o. female returns to the clinic today for follow-up visit accompanied by her cousin. The patient is doing fine today with no specific complaints except for mild shortness of breath. She is tolerating her treatment with carboplatin and Alimta fairly well except for fatigue.  She denied having any significant fever or chills. The patient denied having any chest pain, cough or hemoptysis. She denied having any nausea or vomiting. She is here to start cycle #6 of her treatment.  MEDICAL HISTORY: Past Medical History  Diagnosis Date  . Hypercholesterolemia   . Hypertension   . Diverticulitis   . Gastric ulcer   . Arthritis   . Anemia   . Back injury 1998    T-12 fracture (brace)  . Nodule of left lung   . GERD (gastroesophageal reflux disease)   . History of radiation therapy 12/31/2012-01/10/2013    60 gray to left lower lung  . Peripheral vascular disease 1957/1997    DVT bilateral following fall  . Hx of cancer of lung 2014    tx with radiation only  . Numbness     left lower leg since left total knee nerve damage  . History of kidney stones     x1 attack many yrs  ago  . Difficulty sleeping   . Cancer 2014    adenocarcinoma/  lung  . Diabetes mellitus without complication     type 2-metformin  . Antineoplastic chemotherapy induced anemia 06/12/2015    ALLERGIES:  is allergic to actos and fosamax.  MEDICATIONS:  Current Outpatient Prescriptions  Medication Sig Dispense Refill  . acetaminophen (TYLENOL) 500 MG tablet Take 1,000 mg by mouth at bedtime as needed for mild pain.    [name] aspirin EC 81 MG tablet Take 81 mg by mouth.    . B-D UF III MINI PEN NEEDLES 31G X 5 MM MISC USE AS DIRECTED WITH HUMALOG PEN  0  . bisacodyl (DULCOLAX) 10 MG suppository Place 1 suppository (10 mg total) rectally daily as needed for moderate constipation. 12 suppository 0  . CARBOplatin (PARAPLATIN) 150 MG injection Inject into the vein.    . cyanocobalamin (,VITAMIN B-12,) 1000 MCG/ML injection 1,000 mcg.    [name] dexamethasone (DECADRON) 4 MG tablet 4 mg po bid the day before, day of and day after chemo 40 tablet 1  . DHA-EPA-VITAMIN E PO Take by mouth.    . dicyclomine (BENTYL) 20 MG tablet Take 20 mg by mouth every 6 (six) hours as needed (for stomach).    . docusate sodium (COLACE) 100 MG capsule Take 1 capsule (100 mg total) by mouth 2 (two) times daily. 10 capsule 0  . folic acid (FOLVITE) 1 MG tablet Take 1 tablet (1 mg total)  by mouth daily. 30 tablet 4  . glimepiride (AMARYL) 2 MG tablet Take 2 mg by mouth daily with breakfast.    . glucose blood (ONE TOUCH ULTRA TEST) test strip Use 2 (two) times daily. Use as instructed.    [name] HUMALOG KWIKPEN 100 UNIT/ML KiwkPen See admin instructions.  0  . menthol-cetylpyridinium (CEPACOL) 3 MG lozenge Take 1 lozenge by mouth as needed for sore throat.    . metFORMIN (GLUCOPHAGE) 500 MG tablet Take 500-1,000 mg by mouth 3 (three) times daily. Take 2 tablets (1000 mg) every morning, 1 tablet (500 mg) at noon, 1 tablet (500 mg) every evening    . Multiple Vitamins-Minerals (PRESERVISION AREDS) TABS Take 1 each by mouth.    .  ondansetron (ZOFRAN) 4 MG tablet Take 1 tablet (4 mg total) by mouth every 6 (six) hours as needed for nausea. 40 tablet 0  . pantoprazole (PROTONIX) 40 MG tablet Take 40 mg by mouth daily.    [name] PEMEtrexed (ALIMTA) 500 MG injection Inject into the vein.    . pioglitazone (ACTOS) 15 MG tablet Take 15 mg by mouth daily with breakfast.     . polyethylene glycol (MIRALAX / GLYCOLAX) packet Take 17 g by mouth daily as needed (constipation).    . pravastatin (PRAVACHOL) 20 MG tablet Take 20 mg by mouth at bedtime.    . prochlorperazine (COMPAZINE) 10 MG tablet Take 1 tablet (10 mg total) by mouth every 6 (six) hours as needed for nausea or vomiting. 30 tablet 0  . sitaGLIPtin (JANUVIA) 100 MG tablet Take 100 mg by mouth daily with breakfast.     . temazepam (RESTORIL) 30 MG capsule Take 1 capsule (30 mg total) by mouth at bedtime as needed for sleep. 30 capsule 0  . valsartan-hydrochlorothiazide (DIOVAN HCT) 160-25 MG per tablet Take 1 tablet by mouth every morning.     No current facility-administered medications for this visit.    SURGICAL HISTORY:  Past Surgical History  Procedure Laterality Date  . Abdominal hysterectomy  1979  . Hernia repair Right 1972  . Eye surgery Bilateral 2007, 2008    cataracts  . Appendectomy  1949  . Breast biopsy Bilateral [id number]  . Carpal tunnel release Bilateral 1997  . Skin graft  1955    right groin  . Cholecystectomy    . Video bronchoscopy with endobronchial navigation N/A 11/22/2012    Procedure: VIDEO BRONCHOSCOPY WITH ENDOBRONCHIAL NAVIGATION;  Surgeon: Melrose Nakayama, MD;  Location: [facility];  Service: Thoracic;  Laterality: N/A;  . Total knee arthroplasty Left 04/22/2013    Procedure: LEFT TOTAL KNEE ARTHROPLASTY;  Surgeon: Gearlean Alf, MD;  Location: [facility];  Service: Orthopedics;  Laterality: Left;  . Superficial peroneal nerve release Left 07/19/2013    Procedure: LEFT PERONEAL NERVE DECOMPRESSION ;  Surgeon: Gearlean Alf, MD;   Location: [facility];  Service: Orthopedics;  Laterality: Left;  . Total knee arthroplasty Right 10/06/2014    Procedure: RIGHT TOTAL KNEE ARTHROPLASTY;  Surgeon: Gearlean Alf, MD;  Location: [facility];  Service: Orthopedics;  Laterality: Right;    REVIEW OF SYSTEMS:  A comprehensive review of systems was negative except for: Constitutional: positive for fatigue   PHYSICAL EXAMINATION: General appearance: alert, cooperative and no distress Head: Normocephalic, without obvious abnormality, atraumatic Neck: no adenopathy, no JVD, supple, symmetrical, trachea midline and thyroid not enlarged, symmetric, no tenderness/mass/nodules Lymph nodes: Cervical, supraclavicular, and axillary nodes normal. Resp: clear to auscultation bilaterally Back: symmetric, no curvature.  ROM normal. No CVA tenderness. Cardio: regular rate and rhythm, S1, S2 normal, no murmur, click, rub or gallop GI: soft, non-tender; bowel sounds normal; no masses,  no organomegaly Extremities: extremities normal, atraumatic, no cyanosis or edema Neurologic: Alert and oriented X 3, normal strength and tone. Normal symmetric reflexes. Normal coordination and gait  ECOG PERFORMANCE STATUS: 1 - Symptomatic but completely ambulatory  Blood pressure 183/80, pulse 59, temperature 98 F (36.7 C), temperature source Oral, resp. rate 18, height _0  (1.6 m), weight 143 lb 1.6 oz (64.91 kg), SpO2 100 %.  LABORATORY DATA: Lab Results  Component Value Date   WBC 5.4 07/23/2015   HGB 10.8* 07/23/2015   HCT 32.7* 07/23/2015   MCV 92.6 07/23/2015   PLT 192 07/23/2015      Chemistry      Component Value Date/Time   NA 137 07/23/2015 0832   NA 135 05/12/2015 2201   NA 141 07/04/2014 1816   K 4.2 07/23/2015 0832   K 4.7 05/12/2015 2201   K 4.6 07/04/2014 1816   CL 105 05/12/2015 2201   CL 108* 07/04/2014 1816   CO2 17* 07/23/2015 0832   CO2 24 05/12/2015 2201   CO2 28 07/04/2014 1816   BUN 28.9* 07/23/2015 0832   BUN 35* 05/12/2015  2201   BUN 35* 07/04/2014 1816   CREATININE 1.5* 07/23/2015 0832   CREATININE 1.18* 05/12/2015 2201   CREATININE 1.01 07/04/2014 1816      Component Value Date/Time   CALCIUM 9.1 07/23/2015 0832   CALCIUM 8.5* 05/12/2015 2201   CALCIUM 9.1 07/04/2014 1816   ALKPHOS 63 07/23/2015 0832   ALKPHOS 51 09/29/2014 1410   ALKPHOS 52 07/04/2014 1816   AST 15 07/23/2015 0832   AST 18 09/29/2014 1410   AST 15 07/04/2014 1816   ALT 9 07/23/2015 0832   ALT 11 09/29/2014 1410   ALT 18 07/04/2014 1816   BILITOT <0.30 07/23/2015 0832   BILITOT 0.3 09/29/2014 1410   BILITOT 0.1* 07/04/2014 1816       RADIOGRAPHIC STUDIES: No results found.  ASSESSMENT AND PLAN: This is a very pleasant 81 years old white female with metastatic non-small cell lung cancer, adenocarcinoma with negative EGFR mutation and negative ALK gene translocation. She is currently undergoing systemic chemotherapy with carboplatin and Alimta status post 5 cycles.  The patient has been tolerating her treatment fairly well except for the fatigue from chemotherapy-induced anemia.  I recommended for her to continue her current systemic chemotherapy with reduced dose carboplatin to AUC of 4 and Alimta at 375 MG/M2 every 3 weeks. She will start cycle #6 today  For insomnia, she will continue on Restoril 30 mg by mouth daily at bedtime. The patient would come back for follow-up visit in 4 weeks for reevaluation after repeating CT scan of the chest, abdomen and pelvis for restaging of her disease. The patient will receive flu shot today. The patient was advised to call immediately if she has any concerning symptoms in the interval. The patient voices understanding of current disease status and treatment options and is in agreement with the current care plan.  All questions were answered. The patient knows to call the clinic with any problems, questions or concerns. We can certainly see the patient much sooner if  necessary.  Disclaimer: This note was dictated with voice recognition software. Similar sounding words can inadvertently be transcribed and may not be corrected upon review.

## 2015-07-23 NOTE — Patient Instructions (Signed)
[facility] Discharge Instructions for Patients Receiving Chemotherapy  Today you received the following chemotherapy agents Amlita and Carboplatin. To help prevent nausea and vomiting after your treatment, we encourage you to take your nausea medication as directed.   If you develop nausea and vomiting that is not controlled by your nausea medication, call the clinic.   BELOW ARE SYMPTOMS THAT SHOULD BE REPORTED IMMEDIATELY:  *FEVER GREATER THAN 100.5 F  *CHILLS WITH OR WITHOUT FEVER  NAUSEA AND VOMITING THAT IS NOT CONTROLLED WITH YOUR NAUSEA MEDICATION  *UNUSUAL SHORTNESS OF BREATH  *UNUSUAL BRUISING OR BLEEDING  TENDERNESS IN MOUTH AND THROAT WITH OR WITHOUT PRESENCE OF ULCERS  *URINARY PROBLEMS  *BOWEL PROBLEMS  UNUSUAL RASH Items with * indicate a potential emergency and should be followed up as soon as possible.  Feel free to call the clinic you have any questions or concerns. The clinic phone number is [phone].  Please show the [facility] at check-in to the Emergency Department and triage nurse.

## 2015-07-23 NOTE — Addendum Note (Signed)
Addended by: Lucile Crater on: 07/23/2015 09:35 AM   Modules accepted: Orders

## 2015-07-23 NOTE — Addendum Note (Signed)
Addended by: Lucile Crater on: 07/23/2015 10:02 AM   Modules accepted: Medications

## 2015-07-23 NOTE — Telephone Encounter (Signed)
Gave patient avs report and appointments for December. [facility] will call re [facility] - patient aware.

## 2015-07-27 ENCOUNTER — Telehealth: Payer: Self-pay | Admitting: Internal Medicine

## 2015-07-27 NOTE — Telephone Encounter (Signed)
pt called to confirm appt...ok and aware of appt

## 2015-07-30 ENCOUNTER — Other Ambulatory Visit: Payer: Self-pay | Admitting: Internal Medicine

## 2015-07-30 ENCOUNTER — Other Ambulatory Visit: Payer: Self-pay | Admitting: *Deleted

## 2015-07-30 DIAGNOSIS — G4700 Insomnia, unspecified: Secondary | ICD-10-CM

## 2015-07-30 DIAGNOSIS — Z1231 Encounter for screening mammogram for malignant neoplasm of breast: Secondary | ICD-10-CM

## 2015-07-30 MED ORDER — TEMAZEPAM 30 MG PO CAPS: 30.0000 mg | ORAL_CAPSULE | Freq: Every evening | ORAL | Status: DC | PRN

## 2015-07-30 NOTE — Telephone Encounter (Signed)
Fax from Pharmacy Refill on Restoril phoned into pt pharmacy

## 2015-08-11 ENCOUNTER — Ambulatory Visit
Admission: RE | Admit: 2015-08-11 | Discharge: 2015-08-11 | Disposition: A | Discharge status: Home / Self Care | Payer: Medicare Other | Source: Ambulatory Visit | Attending: Internal Medicine | Admitting: Internal Medicine

## 2015-08-11 DIAGNOSIS — Z1231 Encounter for screening mammogram for malignant neoplasm of breast: Secondary | ICD-10-CM | POA: Diagnosis present

## 2015-08-18 ENCOUNTER — Encounter (HOSPITAL_COMMUNITY): Payer: Self-pay

## 2015-08-18 ENCOUNTER — Other Ambulatory Visit: Payer: Self-pay | Admitting: Internal Medicine

## 2015-08-18 ENCOUNTER — Ambulatory Visit (HOSPITAL_COMMUNITY)
Admission: RE | Admit: 2015-08-18 | Discharge: 2015-08-18 | Disposition: A | Discharge status: Home / Self Care | Payer: Medicare Other | Source: Ambulatory Visit | Attending: Internal Medicine | Admitting: Internal Medicine

## 2015-08-18 ENCOUNTER — Other Ambulatory Visit (HOSPITAL_BASED_OUTPATIENT_CLINIC_OR_DEPARTMENT_OTHER): Payer: Medicare Other

## 2015-08-18 DIAGNOSIS — I700 Atherosclerosis of aorta: Secondary | ICD-10-CM | POA: Insufficient documentation

## 2015-08-18 DIAGNOSIS — E279 Disorder of adrenal gland, unspecified: Secondary | ICD-10-CM | POA: Diagnosis not present

## 2015-08-18 DIAGNOSIS — C3430 Malignant neoplasm of lower lobe, unspecified bronchus or lung: Secondary | ICD-10-CM

## 2015-08-18 DIAGNOSIS — R590 Localized enlarged lymph nodes: Secondary | ICD-10-CM | POA: Insufficient documentation

## 2015-08-18 DIAGNOSIS — M47896 Other spondylosis, lumbar region: Secondary | ICD-10-CM | POA: Insufficient documentation

## 2015-08-18 DIAGNOSIS — D6481 Anemia due to antineoplastic chemotherapy: Secondary | ICD-10-CM | POA: Diagnosis present

## 2015-08-18 DIAGNOSIS — K449 Diaphragmatic hernia without obstruction or gangrene: Secondary | ICD-10-CM | POA: Diagnosis not present

## 2015-08-18 DIAGNOSIS — J90 Pleural effusion, not elsewhere classified: Secondary | ICD-10-CM | POA: Insufficient documentation

## 2015-08-18 DIAGNOSIS — M4186 Other forms of scoliosis, lumbar region: Secondary | ICD-10-CM | POA: Diagnosis not present

## 2015-08-18 DIAGNOSIS — T451X5A Adverse effect of antineoplastic and immunosuppressive drugs, initial encounter: Secondary | ICD-10-CM | POA: Diagnosis not present

## 2015-08-18 DIAGNOSIS — M4184 Other forms of scoliosis, thoracic region: Secondary | ICD-10-CM | POA: Diagnosis not present

## 2015-08-18 DIAGNOSIS — M47894 Other spondylosis, thoracic region: Secondary | ICD-10-CM | POA: Insufficient documentation

## 2015-08-18 DIAGNOSIS — Z5111 Encounter for antineoplastic chemotherapy: Secondary | ICD-10-CM

## 2015-08-18 DIAGNOSIS — C3432 Malignant neoplasm of lower lobe, left bronchus or lung: Secondary | ICD-10-CM | POA: Diagnosis not present

## 2015-08-18 DIAGNOSIS — Z9221 Personal history of antineoplastic chemotherapy: Secondary | ICD-10-CM | POA: Diagnosis not present

## 2015-08-18 LAB — CBC WITH DIFFERENTIAL/PLATELET
BASO%: 0.5 % (ref 0.0–2.0)
BASOS ABS: 0 10*3/uL (ref 0.0–0.1)
EOS ABS: 0.1 10*3/uL (ref 0.0–0.5)
EOS%: 1.5 % (ref 0.0–7.0)
HEMATOCRIT: 26.1 % — AB (ref 34.8–46.6)
HEMOGLOBIN: 8.7 g/dL — AB (ref 11.6–15.9)
LYMPH#: 0.7 10*3/uL — AB (ref 0.9–3.3)
LYMPH%: 10 % — ABNORMAL LOW (ref 14.0–49.7)
MCH: 31.6 pg (ref 25.1–34.0)
MCHC: 33.5 g/dL (ref 31.5–36.0)
MCV: 94.4 fL (ref 79.5–101.0)
MONO#: 0.5 10*3/uL (ref 0.1–0.9)
MONO%: 8.1 % (ref 0.0–14.0)
NEUT#: 5.3 10*3/uL (ref 1.5–6.5)
NEUT%: 79.9 % — AB (ref 38.4–76.8)
PLATELETS: 217 10*3/uL (ref 145–400)
RBC: 2.77 10*6/uL — ABNORMAL LOW (ref 3.70–5.45)
RDW: 19.8 % — AB (ref 11.2–14.5)
WBC: 6.6 10*3/uL (ref 3.9–10.3)

## 2015-08-18 LAB — COMPREHENSIVE METABOLIC PANEL
ALBUMIN: 3.3 g/dL — AB (ref 3.5–5.0)
ALK PHOS: 72 U/L (ref 40–150)
ALT: 10 U/L (ref 0–55)
ANION GAP: 12 meq/L — AB (ref 3–11)
AST: 20 U/L (ref 5–34)
BUN: 33.6 mg/dL — AB (ref 7.0–26.0)
CALCIUM: 9.6 mg/dL (ref 8.4–10.4)
CHLORIDE: 112 meq/L — AB (ref 98–109)
CO2: 21 mEq/L — ABNORMAL LOW (ref 22–29)
Creatinine: 1.9 mg/dL — ABNORMAL HIGH (ref 0.6–1.1)
EGFR: 24 mL/min/{1.73_m2} — AB (ref >90)
Glucose: 118 mg/dl (ref 70–140)
POTASSIUM: 4.3 meq/L (ref 3.5–5.1)
Sodium: 144 mEq/L (ref 136–145)
Total Bilirubin: <0.3 mg/dL (ref 0.20–1.20)
Total Protein: 7 g/dL (ref 6.4–8.3)

## 2015-08-21 ENCOUNTER — Other Ambulatory Visit: Payer: Self-pay | Admitting: Medical Oncology

## 2015-08-21 DIAGNOSIS — C3430 Malignant neoplasm of lower lobe, unspecified bronchus or lung: Secondary | ICD-10-CM

## 2015-08-21 MED ORDER — FOLIC ACID 1 MG PO TABS: 1.0000 mg | ORAL_TABLET | Freq: Every day | ORAL | Status: DC

## 2015-08-24 ENCOUNTER — Encounter: Payer: Self-pay | Admitting: Internal Medicine

## 2015-08-24 ENCOUNTER — Telehealth: Payer: Self-pay | Admitting: Internal Medicine

## 2015-08-24 ENCOUNTER — Ambulatory Visit (HOSPITAL_BASED_OUTPATIENT_CLINIC_OR_DEPARTMENT_OTHER): Payer: Medicare Other | Admitting: Internal Medicine

## 2015-08-24 VITALS — BP 187/54 | HR 101 | Temp 98.1°F | Resp 18 | Ht 63.0 in | Wt 139.1 lb

## 2015-08-24 DIAGNOSIS — C3490 Malignant neoplasm of unspecified part of unspecified bronchus or lung: Secondary | ICD-10-CM

## 2015-08-24 DIAGNOSIS — R5383 Other fatigue: Secondary | ICD-10-CM | POA: Diagnosis not present

## 2015-08-24 DIAGNOSIS — D6481 Anemia due to antineoplastic chemotherapy: Secondary | ICD-10-CM

## 2015-08-24 DIAGNOSIS — N289 Disorder of kidney and ureter, unspecified: Secondary | ICD-10-CM

## 2015-08-24 DIAGNOSIS — C3432 Malignant neoplasm of lower lobe, left bronchus or lung: Secondary | ICD-10-CM

## 2015-08-24 NOTE — Telephone Encounter (Signed)
per pof to sch pt appt-gave pt copy of avs-adv [facility] will call to sch pt appt

## 2015-08-24 NOTE — Progress Notes (Signed)
[place] Telephone:(336) [phone]   Fax:(336) [place], [place] [address]  DIAGNOSIS: Metastatic non-small cell lung cancer, adenocarcinoma with negative EGFR mutation and negative gene translocation initially diagnosed as stage IB in February 2014 with recurrence in March 2016.   PRIOR THERAPY:  1) Status post stereotactic radiotherapy under the care of Dr. Lisbeth Renshaw completed 01/10/2013. 2) Systemic chemotherapy with carboplatin for AUC of 4 and Alimta 400 MG/M2 every 3 weeks. First dose 04/06/2015. Status post 6 cycles, last dose was given 07/23/2015 with partial response.  CURRENT THERAPY: Observation.  INTERVAL HISTORY: Stephanie Fletcher 81 y.o. female returns to the clinic today for follow-up visit accompanied by her niece. The patient is doing fine today with no specific complaints except for mild shortness of breath and fatigue. She is tolerating her treatment with carboplatin and Alimta fairly well except for fatigue.  She denied having any significant fever or chills. The patient denied having any chest pain, cough or hemoptysis. She denied having any nausea or vomiting. She completed 6 cycles of systemic chemotherapy with carboplatin and Alimta. The patient had repeat CT scan of the chest, abdomen and pelvis performed recently and she is here for evaluation and discussion of her scan results.  MEDICAL HISTORY: Past Medical History  Diagnosis Date  . Hypercholesterolemia   . Hypertension   . Diverticulitis   . Gastric ulcer   . Arthritis   . Anemia   . Back injury 1998    T-12 fracture (brace)  . Nodule of left lung   . GERD (gastroesophageal reflux disease)   . History of radiation therapy 12/31/2012-01/10/2013    60 gray to left lower lung  . Peripheral vascular disease ([place]) 1957/1997    DVT bilateral following fall  . Hx of cancer of lung 2014    tx with  radiation only  . Numbness     left lower leg since left total knee nerve damage  . History of kidney stones     x1 attack many yrs ago  . Difficulty sleeping   . Cancer [facility]) 2014    adenocarcinoma/  lung  . Diabetes mellitus without complication (HCC)     type 2-metformin  . Antineoplastic chemotherapy induced anemia 06/12/2015    ALLERGIES:  is allergic to actos and fosamax.  MEDICATIONS:  Current Outpatient Prescriptions  Medication Sig Dispense Refill  . acetaminophen (TYLENOL) 500 MG tablet Take 1,000 mg by mouth at bedtime as needed for mild pain.    [name] aspirin EC 81 MG tablet Take 81 mg by mouth.    . B-D UF III MINI PEN NEEDLES 31G X 5 MM MISC USE AS DIRECTED WITH HUMALOG PEN  0  . bisacodyl (DULCOLAX) 10 MG suppository Place 1 suppository (10 mg total) rectally daily as needed for moderate constipation. 12 suppository 0  . CARBOplatin (PARAPLATIN) 150 MG injection Inject into the vein.    . cyanocobalamin (,VITAMIN B-12,) 1000 MCG/ML injection 1,000 mcg.    [name] dexamethasone (DECADRON) 4 MG tablet 4 mg po bid the day before, day of and day after chemo 40 tablet 1  . DHA-EPA-VITAMIN E PO Take by mouth.    . dicyclomine (BENTYL) 20 MG tablet Take 20 mg by mouth every 6 (six) hours as needed (for stomach).    . docusate sodium (COLACE) 100 MG capsule Take 1 capsule (100 mg total) by mouth 2 (two)  times daily. 10 capsule 0  . folic acid (FOLVITE) 1 MG tablet Take 1 tablet (1 mg total) by mouth daily. 30 tablet 3  . glimepiride (AMARYL) 2 MG tablet Take 2 mg by mouth daily with breakfast.    . glucose blood (ONE TOUCH ULTRA TEST) test strip Use 2 (two) times daily. Use as instructed.    [name] HUMALOG KWIKPEN 100 UNIT/ML KiwkPen See admin instructions.  0  . menthol-cetylpyridinium (CEPACOL) 3 MG lozenge Take 1 lozenge by mouth as needed for sore throat.    . metFORMIN (GLUCOPHAGE) 500 MG tablet Take 500-1,000 mg by mouth 3 (three) times daily. Take 2 tablets (1000 mg) every morning, 1  tablet (500 mg) at noon, 1 tablet (500 mg) every evening    . Multiple Vitamins-Minerals (PRESERVISION AREDS) TABS Take 1 each by mouth.    . ondansetron (ZOFRAN) 4 MG tablet Take 1 tablet (4 mg total) by mouth every 6 (six) hours as needed for nausea. 40 tablet 0  . pantoprazole (PROTONIX) 40 MG tablet Take 40 mg by mouth daily.    [name] PEMEtrexed (ALIMTA) 500 MG injection Inject into the vein.    . pioglitazone (ACTOS) 15 MG tablet Take 15 mg by mouth daily with breakfast.     . polyethylene glycol (MIRALAX / GLYCOLAX) packet Take 17 g by mouth daily as needed (constipation).    . pravastatin (PRAVACHOL) 20 MG tablet Take 20 mg by mouth at bedtime.    . prochlorperazine (COMPAZINE) 10 MG tablet Take 1 tablet (10 mg total) by mouth every 6 (six) hours as needed for nausea or vomiting. 30 tablet 0  . sitaGLIPtin (JANUVIA) 100 MG tablet Take 100 mg by mouth daily with breakfast.     . temazepam (RESTORIL) 30 MG capsule Take 1 capsule (30 mg total) by mouth at bedtime as needed for sleep. 30 capsule 0  . valsartan-hydrochlorothiazide (DIOVAN HCT) 160-25 MG per tablet Take 1 tablet by mouth every morning.     No current facility-administered medications for this visit.    SURGICAL HISTORY:  Past Surgical History  Procedure Laterality Date  . Abdominal hysterectomy  1979  . Hernia repair Right 1972  . Eye surgery Bilateral 2007, 2008    cataracts  . Appendectomy  1949  . Breast biopsy Bilateral [id number]  . Carpal tunnel release Bilateral 1997  . Skin graft  1955    right groin  . Cholecystectomy    . Video bronchoscopy with endobronchial navigation N/A 11/22/2012    Procedure: VIDEO BRONCHOSCOPY WITH ENDOBRONCHIAL NAVIGATION;  Surgeon: Melrose Nakayama, MD;  Location: [facility];  Service: Thoracic;  Laterality: N/A;  . Total knee arthroplasty Left 04/22/2013    Procedure: LEFT TOTAL KNEE ARTHROPLASTY;  Surgeon: Gearlean Alf, MD;  Location: [facility];  Service: Orthopedics;  Laterality: Left;    . Superficial peroneal nerve release Left 07/19/2013    Procedure: LEFT PERONEAL NERVE DECOMPRESSION ;  Surgeon: Gearlean Alf, MD;  Location: [facility];  Service: Orthopedics;  Laterality: Left;  . Total knee arthroplasty Right 10/06/2014    Procedure: RIGHT TOTAL KNEE ARTHROPLASTY;  Surgeon: Gearlean Alf, MD;  Location: [facility];  Service: Orthopedics;  Laterality: Right;    REVIEW OF SYSTEMS:  Constitutional: positive for fatigue Eyes: negative Ears, nose, mouth, throat, and face: negative Respiratory: positive for dyspnea on exertion Cardiovascular: negative Gastrointestinal: negative Genitourinary:negative Integument/breast: negative Hematologic/lymphatic: negative Musculoskeletal:negative Neurological: negative Behavioral/Psych: negative Endocrine: negative Allergic/Immunologic: negative   PHYSICAL EXAMINATION: General appearance:  alert, cooperative and no distress Head: Normocephalic, without obvious abnormality, atraumatic Neck: no adenopathy, no JVD, supple, symmetrical, trachea midline and thyroid not enlarged, symmetric, no tenderness/mass/nodules Lymph nodes: Cervical, supraclavicular, and axillary nodes normal. Resp: clear to auscultation bilaterally Back: symmetric, no curvature. ROM normal. No CVA tenderness. Cardio: regular rate and rhythm, S1, S2 normal, no murmur, click, rub or gallop GI: soft, non-tender; bowel sounds normal; no masses,  no organomegaly Extremities: extremities normal, atraumatic, no cyanosis or edema Neurologic: Alert and oriented X 3, normal strength and tone. Normal symmetric reflexes. Normal coordination and gait  ECOG PERFORMANCE STATUS: 1 - Symptomatic but completely ambulatory  Blood pressure 187/54, pulse 101, temperature 98.1 F (36.7 C), temperature source Oral, resp. rate 18, height '5\' 3"'  (1.6 m), weight 139 lb 1.6 oz (63.095 kg), SpO2 97 %.  LABORATORY DATA: Lab Results  Component Value Date   WBC 6.6 08/18/2015   HGB 8.7*  08/18/2015   HCT 26.1* 08/18/2015   MCV 94.4 08/18/2015   PLT 217 08/18/2015      Chemistry      Component Value Date/Time   NA 144 08/18/2015 1052   NA 135 05/12/2015 2201   NA 141 07/04/2014 1816   K 4.3 08/18/2015 1052   K 4.7 05/12/2015 2201   K 4.6 07/04/2014 1816   CL 105 05/12/2015 2201   CL 108* 07/04/2014 1816   CO2 21* 08/18/2015 1052   CO2 24 05/12/2015 2201   CO2 28 07/04/2014 1816   BUN 33.6* 08/18/2015 1052   BUN 35* 05/12/2015 2201   BUN 35* 07/04/2014 1816   CREATININE 1.9* 08/18/2015 1052   CREATININE 1.18* 05/12/2015 2201   CREATININE 1.01 07/04/2014 1816      Component Value Date/Time   CALCIUM 9.6 08/18/2015 1052   CALCIUM 8.5* 05/12/2015 2201   CALCIUM 9.1 07/04/2014 1816   ALKPHOS 72 08/18/2015 1052   ALKPHOS 51 09/29/2014 1410   ALKPHOS 52 07/04/2014 1816   AST 20 08/18/2015 1052   AST 18 09/29/2014 1410   AST 15 07/04/2014 1816   ALT 10 08/18/2015 1052   ALT 11 09/29/2014 1410   ALT 18 07/04/2014 1816   BILITOT <0.30 08/18/2015 1052   BILITOT 0.3 09/29/2014 1410   BILITOT 0.1* 07/04/2014 1816       RADIOGRAPHIC STUDIES: Ct Abdomen Pelvis Wo Contrast  08/18/2015  CLINICAL DATA:  Followup non-small cell lung cancer. EXAM: CT CHEST, ABDOMEN AND PELVIS WITHOUT CONTRAST TECHNIQUE: Multidetector CT imaging of the chest, abdomen and pelvis was performed following the standard protocol without IV contrast. COMPARISON:  06/09/2015 FINDINGS: CT CHEST FINDINGS Mediastinum: The heart size appears normal. There is no pericardial effusion identified. Aortic atherosclerosis noted. Calcification within the LAD and left circumflex coronary artery noted as well as left main calcification. The trachea appears patent and is midline. Moderate to large hiatal hernia is again identified. No mediastinal or hilar adenopathy identified. There is no enlarged supraclavicular or axillary lymph nodes. The index sub- carinal lymph node now measures 7 mm, image 24 series 2.  Previously 10 mm. Lungs/Pleura: There is a moderate left pleural effusion which appears partially loculated. This is decreased in volume from previous exam. The left lower lobe lung mass measures 6.4 x 3.9 x 2.8 cm. On the previous exam this measured 6.8 x 4.1 x 3.0 cm. Numerous pulmonary nodules are identified throughout both lungs. These are too numerous to count. Subjectively this appears similar to previous exam. Musculoskeletal: Scoliosis and degenerative disc disease is  again identified. No aggressive lytic or sclerotic bone lesions identified. CT ABDOMEN AND PELVIS FINDINGS Hepatobiliary: No suspicious liver abnormalities identified. Previous cholecystectomy. No biliary dilatation. Pancreas: Normal appearance of the pancreas. Spleen: The spleen is unremarkable. Adrenals/Urinary Tract: The low density nodule in the right adrenal gland measures 1.6 cm and -6 Hounsfield units. This is favored to represent a benign adenoma, unchanged from previous exam. The left adrenal gland measures 1.7 cm and 7.6 Hounsfield units. This is also unchanged from previous exam and likely represents a benign adenoma. Left renal cysts are again noted. Small nonobstructing left renal calculus is noted within the upper pole of left kidney. The urinary bladder appears normal. Stomach/Bowel: Moderate hiatal hernia. The small bowel loops have a normal caliber. Normal appearance of the proximal colon. Distal colonic diverticula noted without acute inflammation. No pathologic dilatation of the bowel loops identified. Vascular/Lymphatic: Calcified atherosclerotic disease involves the abdominal aorta. No aneurysm. No enlarged retroperitoneal or mesenteric adenopathy. No enlarged pelvic or inguinal lymph nodes. Reproductive: The uterus is not visualized and may be surgically absent. No adnexal mass noted. Other: No free fluid or fluid collections within the abdomen or pelvis. Musculoskeletal: Scoliosis and degenerative disc disease noted. The  bones appear osteopenic. No aggressive lytic or sclerotic bone lesions. IMPRESSION: 1. There hs been mild decrease in size of dominant left lower lobe lung mass. 2. Left pleural effusion is again noted. On today's study this appears partially loculated but is not significantly changed in volume. 3. Similar appearance of innumerable pulmonary nodules throughout both lungs. 4. Continued improvement in mediastinal adenopathy with decrease in size of sub- carinal index lymph node. 5. Hiatal hernia. 6. Stable bilateral adrenal nodules. 7. Aortic atherosclerosis. 8. Thoracic and lumbar scoliosis and spondylosis. Electronically Signed   By: Kerby Moors M.D.   On: 08/18/2015 14:55   Ct Chest Wo Contrast  08/18/2015  CLINICAL DATA:  Followup non-small cell lung cancer. EXAM: CT CHEST, ABDOMEN AND PELVIS WITHOUT CONTRAST TECHNIQUE: Multidetector CT imaging of the chest, abdomen and pelvis was performed following the standard protocol without IV contrast. COMPARISON:  06/09/2015 FINDINGS: CT CHEST FINDINGS Mediastinum: The heart size appears normal. There is no pericardial effusion identified. Aortic atherosclerosis noted. Calcification within the LAD and left circumflex coronary artery noted as well as left main calcification. The trachea appears patent and is midline. Moderate to large hiatal hernia is again identified. No mediastinal or hilar adenopathy identified. There is no enlarged supraclavicular or axillary lymph nodes. The index sub- carinal lymph node now measures 7 mm, image 24 series 2. Previously 10 mm. Lungs/Pleura: There is a moderate left pleural effusion which appears partially loculated. This is decreased in volume from previous exam. The left lower lobe lung mass measures 6.4 x 3.9 x 2.8 cm. On the previous exam this measured 6.8 x 4.1 x 3.0 cm. Numerous pulmonary nodules are identified throughout both lungs. These are too numerous to count. Subjectively this appears similar to previous exam.  Musculoskeletal: Scoliosis and degenerative disc disease is again identified. No aggressive lytic or sclerotic bone lesions identified. CT ABDOMEN AND PELVIS FINDINGS Hepatobiliary: No suspicious liver abnormalities identified. Previous cholecystectomy. No biliary dilatation. Pancreas: Normal appearance of the pancreas. Spleen: The spleen is unremarkable. Adrenals/Urinary Tract: The low density nodule in the right adrenal gland measures 1.6 cm and -6 Hounsfield units. This is favored to represent a benign adenoma, unchanged from previous exam. The left adrenal gland measures 1.7 cm and 7.6 Hounsfield units. This is also unchanged from previous  exam and likely represents a benign adenoma. Left renal cysts are again noted. Small nonobstructing left renal calculus is noted within the upper pole of left kidney. The urinary bladder appears normal. Stomach/Bowel: Moderate hiatal hernia. The small bowel loops have a normal caliber. Normal appearance of the proximal colon. Distal colonic diverticula noted without acute inflammation. No pathologic dilatation of the bowel loops identified. Vascular/Lymphatic: Calcified atherosclerotic disease involves the abdominal aorta. No aneurysm. No enlarged retroperitoneal or mesenteric adenopathy. No enlarged pelvic or inguinal lymph nodes. Reproductive: The uterus is not visualized and may be surgically absent. No adnexal mass noted. Other: No free fluid or fluid collections within the abdomen or pelvis. Musculoskeletal: Scoliosis and degenerative disc disease noted. The bones appear osteopenic. No aggressive lytic or sclerotic bone lesions. IMPRESSION: 1. There hs been mild decrease in size of dominant left lower lobe lung mass. 2. Left pleural effusion is again noted. On today's study this appears partially loculated but is not significantly changed in volume. 3. Similar appearance of innumerable pulmonary nodules throughout both lungs. 4. Continued improvement in mediastinal  adenopathy with decrease in size of sub- carinal index lymph node. 5. Hiatal hernia. 6. Stable bilateral adrenal nodules. 7. Aortic atherosclerosis. 8. Thoracic and lumbar scoliosis and spondylosis. Electronically Signed   By: Kerby Moors M.D.   On: 08/18/2015 14:55   Mm Digital Screening Bilateral  08/12/2015  CLINICAL DATA:  Screening. EXAM: DIGITAL SCREENING BILATERAL MAMMOGRAM WITH CAD COMPARISON:  Previous exam(s). ACR Breast Density Category b: There are scattered areas of fibroglandular density. FINDINGS: There are no findings suspicious for malignancy. Images were processed with CAD. IMPRESSION: No mammographic evidence of malignancy. A result letter of this screening mammogram will be mailed directly to the patient. RECOMMENDATION: Screening mammogram in one year. (Code:SM-B-01Y) BI-RADS CATEGORY  1: Negative. Electronically Signed   By: Lovey Newcomer M.D.   On: 08/12/2015 08:07    ASSESSMENT AND PLAN: This is a very pleasant 81 years old white female with metastatic non-small cell lung cancer, adenocarcinoma with negative EGFR mutation and negative ALK gene translocation. She completed a course of systemic chemotherapy with carboplatin and Alimta status post 6 cycles.  The patient has been tolerating her treatment fairly well except for the fatigue from chemotherapy-induced anemia.  The recent CT scan of the chest, abdomen and pelvis showed further improvement of her disease especially in the mediastinal lymph nodes. I discussed the scan results with the patient and her family. I recommended for her to continue on observation for now with repeat CT scan of the chest, abdomen and pelvis in 3 months for restaging of her disease. The patient is not a good candidate for maintenance chemotherapy taken in consideration her persistent fatigue and weakness as well as the renal insufficiency. For the last insufficiency, she is followed by her primary care physician who adjusted her antidiabetic  medications. The patient was advised to call immediately if she has any concerning symptoms in the interval. The patient voices understanding of current disease status and treatment options and is in agreement with the current care plan.  All questions were answered. The patient knows to call the clinic with any problems, questions or concerns. We can certainly see the patient much sooner if necessary.  Disclaimer: This note was dictated with voice recognition software. Similar sounding words can inadvertently be transcribed and may not be corrected upon review.

## 2015-09-01 ENCOUNTER — Other Ambulatory Visit: Payer: Self-pay | Admitting: *Deleted

## 2015-09-01 DIAGNOSIS — G4700 Insomnia, unspecified: Secondary | ICD-10-CM

## 2015-09-01 MED ORDER — TEMAZEPAM 30 MG PO CAPS
30.0000 mg | ORAL_CAPSULE | Freq: Every evening | ORAL | Status: DC | PRN
Start: 2015-09-01 — End: 2015-11-26

## 2015-09-09 ENCOUNTER — Other Ambulatory Visit: Payer: Self-pay | Admitting: Nurse Practitioner

## 2015-09-21 ENCOUNTER — Ambulatory Visit: Admission: RE | Admit: 2015-09-21 | Payer: Medicare Other | Source: Ambulatory Visit | Admitting: Radiation Oncology

## 2015-09-29 ENCOUNTER — Other Ambulatory Visit: Payer: Self-pay | Admitting: Internal Medicine

## 2015-09-29 DIAGNOSIS — N184 Chronic kidney disease, stage 4 (severe): Secondary | ICD-10-CM

## 2015-10-08 ENCOUNTER — Ambulatory Visit
Admission: RE | Admit: 2015-10-08 | Discharge: 2015-10-08 | Disposition: A | Discharge status: Home / Self Care | Payer: Medicare Other | Source: Ambulatory Visit | Attending: Internal Medicine | Admitting: Internal Medicine

## 2015-10-08 DIAGNOSIS — N184 Chronic kidney disease, stage 4 (severe): Secondary | ICD-10-CM | POA: Diagnosis not present

## 2015-10-12 ENCOUNTER — Encounter: Payer: Self-pay | Admitting: Radiation Oncology

## 2015-10-12 ENCOUNTER — Ambulatory Visit
Admission: RE | Admit: 2015-10-12 | Discharge: 2015-10-12 | Disposition: A | Discharge status: Home / Self Care | Payer: Medicare Other | Source: Ambulatory Visit | Attending: Radiation Oncology | Admitting: Radiation Oncology

## 2015-10-12 VITALS — BP 157/44 | HR 78 | Temp 98.1°F | Resp 16 | Ht 63.0 in | Wt 136.4 lb

## 2015-10-12 DIAGNOSIS — C3432 Malignant neoplasm of lower lobe, left bronchus or lung: Secondary | ICD-10-CM

## 2015-10-12 NOTE — Addendum Note (Signed)
Encounter addended by: Kyung Rudd, MD on: 10/12/2015  5:59 PM<BR>     Documentation filed: Follow-up Section

## 2015-10-12 NOTE — Patient Instructions (Signed)
Contact our office if you have any questions following today's appointment: [phone].  

## 2015-10-12 NOTE — Progress Notes (Signed)
Radiation Oncology         [phone] ________________________________  Name: Stephanie Fletcher MRN: [id number]  Date: 10/12/2015  DOB: 1928/12/05  Follow-Up Visit Note  CC: Singh,Jasmine, MD  Melrose Nakayama, *  Diagnosis: Non-small cell lung cancer of the left lung status post stereotactic body radiation treatment  Interval Since Last Radiation:  2 years and 9 months. 12/31/2012 through 01/10/2013  Site/dose:   The patient was treated to the tumor within the left lower lung to a dose of 60 gray in 5 fractions at 12 gray per fraction. This consisted of a stereotactic body radiotherapy technique using 2 customized fields.  Narrative: Patient for routine follow up completion of radiation. CT of the chest and abd/pelvis on 08/18/15 showed a mild decrease in the size of the dominant left lower lobe lung mass, left pleural effusion was noted, there was a similar appearance of innumerable pulmonary nodules throughout both lungs, continued improvement in mediastinal adenopathy with a decrease in size of a sub-carinal index lymph node, and the bilateral adrenal nodules are stable. She has complaint of occasional coughing and shortness of breath after her last chemotherapy treatment. She denies pain, headaches, or nausea. She reports her eyesight is getting worse and she is driving less because of this. She reports that this started since getting chemotherapy. She states she has macular degeneration and gets shots for it. She will see a kidney specialist in [place] this Friday on 10/16/15. Her creatinine keeps increasing. She reports a good appetite. A complete review of systems is obtained and is otherwise negative.   ALLERGIES:  is allergic to actos and fosamax.  Meds: Current Outpatient Prescriptions  Medication Sig Dispense Refill  . acetaminophen (TYLENOL) 500 MG tablet Take 1,000 mg by mouth at bedtime as needed for mild pain. Reported on 10/12/2015    . aspirin EC 81 MG tablet Take  81 mg by mouth. Reported on 10/12/2015    . dicyclomine (BENTYL) 20 MG tablet Take 20 mg by mouth every 6 (six) hours as needed (for stomach).    . docusate sodium (COLACE) 100 MG capsule Take 1 capsule (100 mg total) by mouth 2 (two) times daily. 10 capsule 0  . folic acid (FOLVITE) 1 MG tablet Take 1 tablet (1 mg total) by mouth daily. 30 tablet 3  . glimepiride (AMARYL) 2 MG tablet Take 2 mg by mouth daily with breakfast.    . glucose blood (ONE TOUCH ULTRA TEST) test strip Use 2 (two) times daily. Use as instructed.    . menthol-cetylpyridinium (CEPACOL) 3 MG lozenge Take 1 lozenge by mouth as needed for sore throat.    . Multiple Vitamins-Minerals (PRESERVISION AREDS) TABS Take 1 each by mouth.    . pantoprazole (PROTONIX) 40 MG tablet Take 40 mg by mouth daily.    . pioglitazone (ACTOS) 15 MG tablet Take 15 mg by mouth daily with breakfast.     . polyethylene glycol (MIRALAX / GLYCOLAX) packet Take 17 g by mouth daily as needed (constipation).    . pravastatin (PRAVACHOL) 20 MG tablet Take 20 mg by mouth at bedtime.    . sitaGLIPtin (JANUVIA) 100 MG tablet Take 100 mg by mouth daily with breakfast.     . temazepam (RESTORIL) 30 MG capsule Take 1 capsule (30 mg total) by mouth at bedtime as needed for sleep. 30 capsule 0  . valsartan-hydrochlorothiazide (DIOVAN HCT) 160-25 MG per tablet Take 1 tablet by mouth every morning.    . bisacodyl (DULCOLAX)  10 MG suppository Place 1 suppository (10 mg total) rectally daily as needed for moderate constipation. (Patient not taking: Reported on 08/24/2015) 12 suppository 0  . DHA-EPA-VITAMIN E PO Take by mouth. Reported on 10/12/2015    . ondansetron (ZOFRAN) 4 MG tablet Take 1 tablet (4 mg total) by mouth every 6 (six) hours as needed for nausea. (Patient not taking: Reported on 08/24/2015) 40 tablet 0  . vitamin E 400 UNIT capsule Take by mouth. Reported on 10/12/2015     No current facility-administered medications for this encounter.    Physical  Findings:   height is '5\' 3"'$  (1.6 m) and weight is 136 lb 6.4 oz (61.871 kg). Her oral temperature is 98.1 F (36.7 C). Her blood pressure is 157/44 and her pulse is 78. Her respiration is 16 and oxygen saturation is 98%.   Pain scale 0/10 The patient presents to the clinic with a walker. In general this is a well appearing caucasian female in no acute distress. She's alert and oriented and appropriate during the encounter. Cardiovascular exam reveals a regular rate and rhythm, no clicks, rubs, or murmurs are noted. Chest is clear to ausculation bilaterally. No adenopathy is noted in the cervical, supraclavicular, or axillary chains.   Lab Findings: Lab Results  Component Value Date   WBC 6.6 08/18/2015   HGB 8.7* 08/18/2015   HCT 26.1* 08/18/2015   MCV 94.4 08/18/2015   PLT 217 08/18/2015     Radiographic Findings: US Renal  10/08/2015  CLINICAL DATA:  Stage 4 chronic kidney disease EXAM: RENAL / URINARY TRACT ULTRASOUND COMPLETE COMPARISON:  04/03/2015 FINDINGS: Right Kidney: Length: 10.4 cm. Increased echogenicity. No cyst mass stone or hydronephrosis. Left Kidney: Length: 9.3 cm. Increased echogenicity. 2 cm midpole cyst. No mass stone or hydronephrosis. Bladder: Appears normal for degree of bladder distention. IMPRESSION: Medical renal disease Electronically Signed   By: Skipper Cliche M.D.   On: 10/08/2015 14:34    Impression: Non small cell carcinoma of the Left lower lobe. The patient is clinically is stable.   Plan: She is doing well. We discussed the need for continual follow up with medical oncology. I will follow up with her on an as needed basis. She could always call me with any questions or concerns.  The above documentation reflects my direct findings during this shared patient visit. Please see the separate note by Dr. Lisbeth Renshaw on this date for the remainder of the patient's plan of care.  Carola Rhine, PAC   This document serves as a record of services personally  performed by Shona Simpson PA and Kyung Rudd, MD. It was created on their behalf by Darcus Austin, a trained medical scribe. The creation of this record is based on the scribe's personal observations and the provider's statements to them. This document has been checked and approved by the attending provider.

## 2015-10-12 NOTE — Progress Notes (Signed)
follow up a/p radiation  Left lower lung  12/31/12-01/10/13, no c/o coughing, pain, or nausea, last Ct  Chestr/abd/pelvis done 08/18/15.  Will see Kidney specialist in [place]  This Friday  10/16/15 Cr keeps increasing, appetite good,  No other issues 11:27 AM BP 157/44 mmHg  Pulse 78  Temp(Src) 98.1 F (36.7 C) (Oral)  Resp 16  Ht '5\' 3"'$  (1.6 m)  Wt 136 lb 6.4 oz (61.871 kg)  BMI 24.17 kg/m2  SpO2 98%  Wt Readings from Last 3 Encounters:  10/12/15 136 lb 6.4 oz (61.871 kg)  08/24/15 139 lb 1.6 oz (63.095 kg)  07/23/15 143 lb 1.6 oz (64.91 kg)

## 2015-11-17 ENCOUNTER — Other Ambulatory Visit (HOSPITAL_BASED_OUTPATIENT_CLINIC_OR_DEPARTMENT_OTHER): Payer: Medicare Other

## 2015-11-17 ENCOUNTER — Other Ambulatory Visit: Payer: Medicare Other

## 2015-11-17 ENCOUNTER — Other Ambulatory Visit: Payer: Self-pay | Admitting: *Deleted

## 2015-11-17 ENCOUNTER — Ambulatory Visit (HOSPITAL_COMMUNITY)
Admission: RE | Admit: 2015-11-17 | Discharge: 2015-11-17 | Disposition: A | Discharge status: Home / Self Care | Payer: Medicare Other | Source: Ambulatory Visit | Attending: Internal Medicine | Admitting: Internal Medicine

## 2015-11-17 DIAGNOSIS — I2510 Atherosclerotic heart disease of native coronary artery without angina pectoris: Secondary | ICD-10-CM | POA: Diagnosis not present

## 2015-11-17 DIAGNOSIS — K5730 Diverticulosis of large intestine without perforation or abscess without bleeding: Secondary | ICD-10-CM | POA: Insufficient documentation

## 2015-11-17 DIAGNOSIS — J90 Pleural effusion, not elsewhere classified: Secondary | ICD-10-CM | POA: Diagnosis not present

## 2015-11-17 DIAGNOSIS — C3432 Malignant neoplasm of lower lobe, left bronchus or lung: Secondary | ICD-10-CM

## 2015-11-17 DIAGNOSIS — C7800 Secondary malignant neoplasm of unspecified lung: Secondary | ICD-10-CM | POA: Diagnosis not present

## 2015-11-17 LAB — COMPREHENSIVE METABOLIC PANEL
ALT: 8 U/L (ref 0–55)
ANION GAP: 9 meq/L (ref 3–11)
AST: 14 U/L (ref 5–34)
Albumin: 3.3 g/dL — ABNORMAL LOW (ref 3.5–5.0)
Alkaline Phosphatase: 80 U/L (ref 40–150)
BUN: 55 mg/dL — ABNORMAL HIGH (ref 7.0–26.0)
CHLORIDE: 109 meq/L (ref 98–109)
CO2: 21 meq/L — AB (ref 22–29)
CREATININE: 2.1 mg/dL — AB (ref 0.6–1.1)
Calcium: 9.2 mg/dL (ref 8.4–10.4)
EGFR: 21 mL/min/{1.73_m2} — AB (ref >90)
Glucose: 133 mg/dl (ref 70–140)
Potassium: 5.2 mEq/L — ABNORMAL HIGH (ref 3.5–5.1)
SODIUM: 139 meq/L (ref 136–145)
Total Bilirubin: 0.2 mg/dL (ref 0.20–1.20)
Total Protein: 6.9 g/dL (ref 6.4–8.3)

## 2015-11-17 LAB — CBC WITH DIFFERENTIAL/PLATELET
BASO%: 0.5 % (ref 0.0–2.0)
BASOS ABS: 0 10*3/uL (ref 0.0–0.1)
EOS ABS: 0.1 10*3/uL (ref 0.0–0.5)
EOS%: 1.8 % (ref 0.0–7.0)
HEMATOCRIT: 29.6 % — AB (ref 34.8–46.6)
HGB: 9.6 g/dL — ABNORMAL LOW (ref 11.6–15.9)
LYMPH#: 1.1 10*3/uL (ref 0.9–3.3)
LYMPH%: 14.9 % (ref 14.0–49.7)
MCH: 32.5 pg (ref 25.1–34.0)
MCHC: 32.4 g/dL (ref 31.5–36.0)
MCV: 100.1 fL (ref 79.5–101.0)
MONO#: 0.4 10*3/uL (ref 0.1–0.9)
MONO%: 4.9 % (ref 0.0–14.0)
NEUT#: 5.6 10*3/uL (ref 1.5–6.5)
NEUT%: 77.9 % — ABNORMAL HIGH (ref 38.4–76.8)
PLATELETS: 234 10*3/uL (ref 145–400)
RBC: 2.95 10*6/uL — ABNORMAL LOW (ref 3.70–5.45)
RDW: 12.4 % (ref 11.2–14.5)
WBC: 7.2 10*3/uL (ref 3.9–10.3)

## 2015-11-26 ENCOUNTER — Ambulatory Visit (HOSPITAL_COMMUNITY)
Admission: RE | Admit: 2015-11-26 | Discharge: 2015-11-26 | Disposition: A | Discharge status: Home / Self Care | Payer: Medicare Other | Source: Ambulatory Visit | Attending: General Surgery | Admitting: General Surgery

## 2015-11-26 ENCOUNTER — Encounter: Payer: Self-pay | Admitting: Internal Medicine

## 2015-11-26 ENCOUNTER — Telehealth: Payer: Self-pay | Admitting: Internal Medicine

## 2015-11-26 ENCOUNTER — Ambulatory Visit (HOSPITAL_COMMUNITY)
Admission: RE | Admit: 2015-11-26 | Discharge: 2015-11-26 | Disposition: A | Discharge status: Home / Self Care | Payer: Medicare Other | Source: Ambulatory Visit | Attending: Internal Medicine | Admitting: Internal Medicine

## 2015-11-26 ENCOUNTER — Telehealth: Payer: Self-pay | Admitting: *Deleted

## 2015-11-26 ENCOUNTER — Other Ambulatory Visit: Payer: Self-pay | Admitting: Medical Oncology

## 2015-11-26 ENCOUNTER — Ambulatory Visit (HOSPITAL_BASED_OUTPATIENT_CLINIC_OR_DEPARTMENT_OTHER): Payer: Medicare Other | Admitting: Internal Medicine

## 2015-11-26 VITALS — BP 185/48 | HR 74 | Temp 97.9°F | Resp 17 | Ht 63.0 in | Wt 138.0 lb

## 2015-11-26 DIAGNOSIS — J90 Pleural effusion, not elsewhere classified: Secondary | ICD-10-CM | POA: Insufficient documentation

## 2015-11-26 DIAGNOSIS — D6481 Anemia due to antineoplastic chemotherapy: Secondary | ICD-10-CM

## 2015-11-26 DIAGNOSIS — T451X5A Adverse effect of antineoplastic and immunosuppressive drugs, initial encounter: Secondary | ICD-10-CM

## 2015-11-26 DIAGNOSIS — R5382 Chronic fatigue, unspecified: Secondary | ICD-10-CM | POA: Insufficient documentation

## 2015-11-26 DIAGNOSIS — C3432 Malignant neoplasm of lower lobe, left bronchus or lung: Secondary | ICD-10-CM

## 2015-11-26 DIAGNOSIS — C801 Malignant (primary) neoplasm, unspecified: Secondary | ICD-10-CM | POA: Insufficient documentation

## 2015-11-26 DIAGNOSIS — G4700 Insomnia, unspecified: Secondary | ICD-10-CM

## 2015-11-26 DIAGNOSIS — C7800 Secondary malignant neoplasm of unspecified lung: Secondary | ICD-10-CM | POA: Insufficient documentation

## 2015-11-26 DIAGNOSIS — Z9889 Other specified postprocedural states: Secondary | ICD-10-CM

## 2015-11-26 DIAGNOSIS — Z5112 Encounter for antineoplastic immunotherapy: Secondary | ICD-10-CM | POA: Insufficient documentation

## 2015-11-26 DIAGNOSIS — I10 Essential (primary) hypertension: Secondary | ICD-10-CM

## 2015-11-26 DIAGNOSIS — R5383 Other fatigue: Secondary | ICD-10-CM | POA: Insufficient documentation

## 2015-11-26 DIAGNOSIS — N289 Disorder of kidney and ureter, unspecified: Secondary | ICD-10-CM

## 2015-11-26 HISTORY — DX: Pleural effusion, not elsewhere classified: J90

## 2015-11-26 HISTORY — DX: Other fatigue: R53.83

## 2015-11-26 MED ORDER — TEMAZEPAM 30 MG PO CAPS: 30.0000 mg | ORAL_CAPSULE | Freq: Every evening | ORAL | Status: DC | PRN

## 2015-11-26 MED ORDER — TEMAZEPAM 30 MG PO CAPS: 30.0000 mg | ORAL_CAPSULE | Freq: Every evening | ORAL | Status: AC | PRN

## 2015-11-26 NOTE — Procedures (Signed)
Ultrasound-guided therapeutic left thoracentesis performed yielding 1.75liters of clear amber colored fluid. No immediate complications. Follow-up chest x-ray pending.      Caydyn Sprung E 2:13 PM 11/26/2015

## 2015-11-26 NOTE — Telephone Encounter (Signed)
Per staff phone call and POF I have schedueld appts. Scheduler advised of appts.  JMW  

## 2015-11-26 NOTE — Telephone Encounter (Signed)
per pof to sch pt appt-sch thoranMW sch trmt-gavept copy of avs

## 2015-11-26 NOTE — Progress Notes (Signed)
[place] Telephone:([phone]) [phone]   Fax:([phone]) [place], MD [place] [address]  DIAGNOSIS: Metastatic non-small cell lung cancer, adenocarcinoma with negative EGFR mutation and negative gene translocation initially diagnosed as stage IB in February 2014 with recurrence in March 2016.   PRIOR THERAPY:  1) Status post stereotactic radiotherapy under the care of Dr. Lisbeth Renshaw completed 01/10/2013. 2) Systemic chemotherapy with carboplatin for AUC of 4 and Alimta 400 MG/M2 every 3 weeks. First dose 04/06/2015. Status post 6 cycles, last dose was given 07/23/2015 with partial response.  CURRENT THERAPY: Observation.  INTERVAL HISTORY: Stephanie Fletcher 82 y.o. female returns to the clinic today for follow-up visit accompanied by her niece. The patient is doing fine today with no specific complaints except for mild shortness of breath and fatigue. Her renal function has been deteriorating recently. She is followed by a nephrologist in [facility]. She has been observation for the last 3 months. She denied having any significant fever or chills. The patient denied having any chest pain, cough or hemoptysis. She denied having any nausea or vomiting. The patient had repeat CT scan of the chest, abdomen and pelvis performed recently and she is here for evaluation and discussion of her scan results.  MEDICAL HISTORY: Past Medical History  Diagnosis Date  . Hypercholesterolemia   . Hypertension   . Diverticulitis   . Gastric ulcer   . Arthritis   . Anemia   . Back injury 1998    T-12 fracture (brace)  . Nodule of left lung   . GERD (gastroesophageal reflux disease)   . History of radiation therapy 12/31/2012-01/10/2013    60 gray to left lower lung  . Peripheral vascular disease ([place]) 1957/1997    DVT bilateral following fall  . Hx of cancer of lung 2014    tx with radiation only   . Numbness     left lower leg since left total knee nerve damage  . History of kidney stones     x1 attack many yrs ago  . Difficulty sleeping   . Cancer [facility]) 2014    adenocarcinoma/  lung  . Diabetes mellitus without complication (HCC)     type 2-metformin  . Antineoplastic chemotherapy induced anemia 06/12/2015  . Primary cancer of left lower lobe of lung ([place]) 12/17/2012    ALLERGIES:  is allergic to actos and fosamax.  MEDICATIONS:  Current Outpatient Prescriptions  Medication Sig Dispense Refill  . acetaminophen (TYLENOL) 500 MG tablet Take 1,000 mg by mouth at bedtime as needed for mild pain. Reported on 10/12/2015    . aspirin EC 81 MG tablet Take 81 mg by mouth. Reported on 10/12/2015    . bisacodyl (DULCOLAX) 10 MG suppository Place 1 suppository (10 mg total) rectally daily as needed for moderate constipation. (Patient not taking: Reported on 08/24/2015) 12 suppository 0  . DHA-EPA-VITAMIN E PO Take by mouth. Reported on 10/12/2015    . dicyclomine (BENTYL) 20 MG tablet Take 20 mg by mouth every 6 (six) hours as needed (for stomach).    . docusate sodium (COLACE) 100 MG capsule Take 1 capsule (100 mg total) by mouth 2 (two) times daily. 10 capsule 0  . folic acid (FOLVITE) 1 MG tablet Take 1 tablet (1 mg total) by mouth daily. 30 tablet 3  . glimepiride (AMARYL) 2 MG tablet Take 2 mg by mouth daily with breakfast.    .  glucose blood (ONE TOUCH ULTRA TEST) test strip Use 2 (two) times daily. Use as instructed.    . menthol-cetylpyridinium (CEPACOL) 3 MG lozenge Take 1 lozenge by mouth as needed for sore throat.    . Multiple Vitamins-Minerals (PRESERVISION AREDS) TABS Take 1 each by mouth.    . ondansetron (ZOFRAN) 4 MG tablet Take 1 tablet (4 mg total) by mouth every 6 (six) hours as needed for nausea. (Patient not taking: Reported on 08/24/2015) 40 tablet 0  . pantoprazole (PROTONIX) 40 MG tablet Take 40 mg by mouth daily.    . pioglitazone (ACTOS) 15 MG tablet Take 15 mg by  mouth daily with breakfast.     . polyethylene glycol (MIRALAX / GLYCOLAX) packet Take 17 g by mouth daily as needed (constipation).    . pravastatin (PRAVACHOL) 20 MG tablet Take 20 mg by mouth at bedtime.    . sitaGLIPtin (JANUVIA) 100 MG tablet Take 100 mg by mouth daily with breakfast.     . temazepam (RESTORIL) 30 MG capsule Take 1 capsule (30 mg total) by mouth at bedtime as needed for sleep. 30 capsule 0  . valsartan-hydrochlorothiazide (DIOVAN HCT) 160-25 MG per tablet Take 1 tablet by mouth every morning.    . vitamin E 400 UNIT capsule Take by mouth. Reported on 10/12/2015     No current facility-administered medications for this visit.    SURGICAL HISTORY:  Past Surgical History  Procedure Laterality Date  . Abdominal hysterectomy  1979  . Hernia repair Right 1972  . Eye surgery Bilateral 2007, 2008    cataracts  . Appendectomy  1949  . Breast biopsy Bilateral [id number]  . Carpal tunnel release Bilateral 1997  . Skin graft  1955    right groin  . Cholecystectomy    . Video bronchoscopy with endobronchial navigation N/A 11/22/2012    Procedure: VIDEO BRONCHOSCOPY WITH ENDOBRONCHIAL NAVIGATION;  Surgeon: Melrose Nakayama, MD;  Location: [place];  Service: Thoracic;  Laterality: N/A;  . Total knee arthroplasty Left 04/22/2013    Procedure: LEFT TOTAL KNEE ARTHROPLASTY;  Surgeon: Gearlean Alf, MD;  Location: [facility];  Service: Orthopedics;  Laterality: Left;  . Superficial peroneal nerve release Left 07/19/2013    Procedure: LEFT PERONEAL NERVE DECOMPRESSION ;  Surgeon: Gearlean Alf, MD;  Location: [facility];  Service: Orthopedics;  Laterality: Left;  . Total knee arthroplasty Right 10/06/2014    Procedure: RIGHT TOTAL KNEE ARTHROPLASTY;  Surgeon: Gearlean Alf, MD;  Location: [facility];  Service: Orthopedics;  Laterality: Right;    REVIEW OF SYSTEMS:  Constitutional: positive for fatigue Eyes: negative Ears, nose, mouth, throat, and face: negative Respiratory: positive for  dyspnea on exertion Cardiovascular: negative Gastrointestinal: negative Genitourinary:negative Integument/breast: negative Hematologic/lymphatic: negative Musculoskeletal:negative Neurological: negative Behavioral/Psych: negative Endocrine: negative Allergic/Immunologic: negative   PHYSICAL EXAMINATION: General appearance: alert, cooperative and no distress Head: Normocephalic, without obvious abnormality, atraumatic Neck: no adenopathy, no JVD, supple, symmetrical, trachea midline and thyroid not enlarged, symmetric, no tenderness/mass/nodules Lymph nodes: Cervical, supraclavicular, and axillary nodes normal. Resp: clear to auscultation bilaterally Back: symmetric, no curvature. ROM normal. No CVA tenderness. Cardio: regular rate and rhythm, S1, S2 normal, no murmur, click, rub or gallop GI: soft, non-tender; bowel sounds normal; no masses,  no organomegaly Extremities: extremities normal, atraumatic, no cyanosis or edema Neurologic: Alert and oriented X 3, normal strength and tone. Normal symmetric reflexes. Normal coordination and gait  ECOG PERFORMANCE STATUS: 1 - Symptomatic but completely ambulatory  Blood pressure 201/48, pulse  74, temperature 97.9 F (36.6 C), temperature source Oral, resp. rate 17, height _0  (1.6 m), weight 138 lb (62.596 kg), SpO2 97 %.  LABORATORY DATA: Lab Results  Component Value Date   WBC 7.2 11/17/2015   HGB 9.6* 11/17/2015   HCT 29.6* 11/17/2015   MCV 100.1 11/17/2015   PLT 234 11/17/2015      Chemistry      Component Value Date/Time   NA 139 11/17/2015 1050   NA 135 05/12/2015 2201   NA 141 07/04/2014 1816   K 5.2 No visable hemolysis* 11/17/2015 1050   K 4.7 05/12/2015 2201   K 4.6 07/04/2014 1816   CL 105 05/12/2015 2201   CL 108* 07/04/2014 1816   CO2 21* 11/17/2015 1050   CO2 24 05/12/2015 2201   CO2 28 07/04/2014 1816   BUN 55.0* 11/17/2015 1050   BUN 35* 05/12/2015 2201   BUN 35* 07/04/2014 1816   CREATININE 2.1*  11/17/2015 1050   CREATININE 1.18* 05/12/2015 2201   CREATININE 1.01 07/04/2014 1816      Component Value Date/Time   CALCIUM 9.2 11/17/2015 1050   CALCIUM 8.5* 05/12/2015 2201   CALCIUM 9.1 07/04/2014 1816   ALKPHOS 80 11/17/2015 1050   ALKPHOS 51 09/29/2014 1410   ALKPHOS 52 07/04/2014 1816   AST 14 11/17/2015 1050   AST 18 09/29/2014 1410   AST 15 07/04/2014 1816   ALT 8 11/17/2015 1050   ALT 11 09/29/2014 1410   ALT 18 07/04/2014 1816   BILITOT 0.20 11/17/2015 1050   BILITOT 0.3 09/29/2014 1410   BILITOT 0.1* 07/04/2014 1816       RADIOGRAPHIC STUDIES: Ct Abdomen Pelvis Wo Contrast  11/17/2015  CLINICAL DATA:  82 year old female with history of left lower lobe lung cancer. Restaging examination. EXAM: CT CHEST, ABDOMEN AND PELVIS WITHOUT CONTRAST TECHNIQUE: Multidetector CT imaging of the chest, abdomen and pelvis was performed following the standard protocol without IV contrast. COMPARISON:  Multiple priors, most recently CT of the chest, abdomen and pelvis 08/18/2015. FINDINGS: CT CHEST FINDINGS Mediastinum/Lymph Nodes: Heart size is borderline enlarged. There is no significant pericardial fluid, thickening or pericardial calcification. There is atherosclerosis of the thoracic aorta, the great vessels of the mediastinum and the coronary arteries, including calcified atherosclerotic plaque in the left main, left anterior descending, left circumflex and right coronary arteries. Calcifications of the aortic valve and mitral annulus. No pathologically enlarged mediastinal or hilar lymph nodes. Please note that accurate exclusion of hilar adenopathy is limited on noncontrast CT scans. Moderate to large hiatal hernia. No axillary lymphadenopathy. Lungs/Pleura: Previously noted left lower lobe mass is difficult to discretely visualize given the lack of IV contrast on today's examination, but is estimated to measure approximately 4.7 x 5.2 cm (image 36 of series 2). Enlarging large left  pleural effusion, likely malignant. Innumerable randomly distributed pulmonary nodules are again noted throughout the lungs bilaterally. These appear generally increased in size and number compared to the prior examination, suggesting progression of metastatic disease. Previously noted 6 mm right lower lobe nodule now measures up to 8 mm (image 41 of series 4). Many of these pulmonary nodules appear to be centrally cavitary, as was evident on the prior examination. Trace right pleural effusion layering dependently. Musculoskeletal/Soft Tissues: There are again healing nondisplaced fractures of the posterolateral aspects of the left sixth, seventh and eighth ribs. There are no aggressive appearing lytic or blastic lesions noted in the visualized portions of the skeleton. CT ABDOMEN AND PELVIS FINDINGS  Hepatobiliary: No definite cystic or solid hepatic lesions are identified on today's noncontrast CT examination. Status post cholecystectomy. Pancreas: No definite pancreatic mass or peripancreatic inflammatory changes on today's noncontrast CT examination. Spleen: Unremarkable. Adrenals/Urinary Tract: Unenhanced appearance of the right kidney is normal. 1.2 cm exophytic intermediate attenuation lesion in the posterior aspect of the interpolar region of the left kidney appears slightly smaller than the prior study, and although this is incompletely characterize, this is likely to represent an involuting proteinaceous/hemorrhagic cyst. Sub cm low-attenuation lesion in the anterior aspect of the lower pole of the left kidney is too small to definitively characterize, but is similar to the prior study, favored to represent a small cyst. 3 mm nonobstructive calculus in the interpolar collecting system of the left kidney. No hydroureteronephrosis. Unenhanced appearance of the urinary bladder is normal. Low-attenuation adrenal nodules bilaterally appear unchanged, compatible with adenomas, measuring up to 1.8 cm on the left  side. Stomach/Bowel: Unenhanced appearance of the intra abdominal portion of the stomach is unremarkable. No pathologic dilatation of small bowel or colon. Numerous colonic diverticulae are noted, particularly in the descending colon and sigmoid colon, without surrounding inflammatory changes to suggest an acute diverticulitis at this time. Transverse colon is very redundant. Vascular/Lymphatic: Extensive atherosclerotic calcifications throughout the abdominal and pelvic vasculature, without definite aneurysm. No definite lymphadenopathy noted in the abdomen or pelvis on today's noncontrast CT examination. Reproductive: Status post hysterectomy. Ovaries are not confidently identified may be surgically absent or atrophic. Other: No significant volume of ascites.  No pneumoperitoneum. Musculoskeletal: Old L1 compression fracture with approximately 60% loss of height is unchanged. There are no aggressive appearing lytic or blastic lesions noted in the visualized portions of the skeleton. IMPRESSION: 1. Today's study demonstrates progression of disease as evidenced by interval increase in number and size of the innumerable metastatic pulmonary nodules throughout the lungs bilaterally. Large left lower lobe mass is roughly stable in size compared to the prior examination. There is also an enlarging large left pleural effusion which is likely malignant. Trace right pleural effusion layering dependently. 2. No definite evidence of metastatic disease to the abdomen or pelvis on today's noncontrast CT examination. 3. Colonic diverticulosis without findings to suggest an acute diverticulitis at this time. 4. Atherosclerosis, including left main and 3 vessel coronary artery disease. 5. Additional incidental findings, as above, similar to prior examinations. Electronically Signed   By: Vinnie Langton M.D.   On: 11/17/2015 13:52   Ct Chest Wo Contrast  11/17/2015  CLINICAL DATA:  82 year old female with history of left lower  lobe lung cancer. Restaging examination. EXAM: CT CHEST, ABDOMEN AND PELVIS WITHOUT CONTRAST TECHNIQUE: Multidetector CT imaging of the chest, abdomen and pelvis was performed following the standard protocol without IV contrast. COMPARISON:  Multiple priors, most recently CT of the chest, abdomen and pelvis 08/18/2015. FINDINGS: CT CHEST FINDINGS Mediastinum/Lymph Nodes: Heart size is borderline enlarged. There is no significant pericardial fluid, thickening or pericardial calcification. There is atherosclerosis of the thoracic aorta, the great vessels of the mediastinum and the coronary arteries, including calcified atherosclerotic plaque in the left main, left anterior descending, left circumflex and right coronary arteries. Calcifications of the aortic valve and mitral annulus. No pathologically enlarged mediastinal or hilar lymph nodes. Please note that accurate exclusion of hilar adenopathy is limited on noncontrast CT scans. Moderate to large hiatal hernia. No axillary lymphadenopathy. Lungs/Pleura: Previously noted left lower lobe mass is difficult to discretely visualize given the lack of IV contrast on today's examination, but  is estimated to measure approximately 4.7 x 5.2 cm (image 36 of series 2). Enlarging large left pleural effusion, likely malignant. Innumerable randomly distributed pulmonary nodules are again noted throughout the lungs bilaterally. These appear generally increased in size and number compared to the prior examination, suggesting progression of metastatic disease. Previously noted 6 mm right lower lobe nodule now measures up to 8 mm (image 41 of series 4). Many of these pulmonary nodules appear to be centrally cavitary, as was evident on the prior examination. Trace right pleural effusion layering dependently. Musculoskeletal/Soft Tissues: There are again healing nondisplaced fractures of the posterolateral aspects of the left sixth, seventh and eighth ribs. There are no aggressive  appearing lytic or blastic lesions noted in the visualized portions of the skeleton. CT ABDOMEN AND PELVIS FINDINGS Hepatobiliary: No definite cystic or solid hepatic lesions are identified on today's noncontrast CT examination. Status post cholecystectomy. Pancreas: No definite pancreatic mass or peripancreatic inflammatory changes on today's noncontrast CT examination. Spleen: Unremarkable. Adrenals/Urinary Tract: Unenhanced appearance of the right kidney is normal. 1.2 cm exophytic intermediate attenuation lesion in the posterior aspect of the interpolar region of the left kidney appears slightly smaller than the prior study, and although this is incompletely characterize, this is likely to represent an involuting proteinaceous/hemorrhagic cyst. Sub cm low-attenuation lesion in the anterior aspect of the lower pole of the left kidney is too small to definitively characterize, but is similar to the prior study, favored to represent a small cyst. 3 mm nonobstructive calculus in the interpolar collecting system of the left kidney. No hydroureteronephrosis. Unenhanced appearance of the urinary bladder is normal. Low-attenuation adrenal nodules bilaterally appear unchanged, compatible with adenomas, measuring up to 1.8 cm on the left side. Stomach/Bowel: Unenhanced appearance of the intra abdominal portion of the stomach is unremarkable. No pathologic dilatation of small bowel or colon. Numerous colonic diverticulae are noted, particularly in the descending colon and sigmoid colon, without surrounding inflammatory changes to suggest an acute diverticulitis at this time. Transverse colon is very redundant. Vascular/Lymphatic: Extensive atherosclerotic calcifications throughout the abdominal and pelvic vasculature, without definite aneurysm. No definite lymphadenopathy noted in the abdomen or pelvis on today's noncontrast CT examination. Reproductive: Status post hysterectomy. Ovaries are not confidently identified may  be surgically absent or atrophic. Other: No significant volume of ascites.  No pneumoperitoneum. Musculoskeletal: Old L1 compression fracture with approximately 60% loss of height is unchanged. There are no aggressive appearing lytic or blastic lesions noted in the visualized portions of the skeleton. IMPRESSION: 1. Today's study demonstrates progression of disease as evidenced by interval increase in number and size of the innumerable metastatic pulmonary nodules throughout the lungs bilaterally. Large left lower lobe mass is roughly stable in size compared to the prior examination. There is also an enlarging large left pleural effusion which is likely malignant. Trace right pleural effusion layering dependently. 2. No definite evidence of metastatic disease to the abdomen or pelvis on today's noncontrast CT examination. 3. Colonic diverticulosis without findings to suggest an acute diverticulitis at this time. 4. Atherosclerosis, including left main and 3 vessel coronary artery disease. 5. Additional incidental findings, as above, similar to prior examinations. Electronically Signed   By: Vinnie Langton M.D.   On: 11/17/2015 13:52    ASSESSMENT AND PLAN: This is a very pleasant 82 years old white female with metastatic non-small cell lung cancer, adenocarcinoma with negative EGFR mutation and negative ALK gene translocation. She completed a course of systemic chemotherapy with carboplatin and Alimta status post  6 cycles.  The patient has been observation for the last 3 months but the recent CT scan of the chest showed evidence for disease progression with increase in the number and size of innumerable metastatic pulmonary nodules as well as large left lower lobe mass and left pleural effusion. I had a lengthy discussion with the patient and her niece about her current condition and treatment options. I gave the patient the option of palliative care and hospice referral versus consideration of treatment  with second line immunotherapy with Nivolumab 240 MG IV every 2 weeks. The patient is interested in treatment with immunotherapy. I discussed with her the adverse effect of this treatment including but not limited to immune mediated skin rash, diarrhea, liver, renal, thyroid or other endocrine dysfunction. She is expected to start the first cycle of this treatment next week. For insomnia, I gave the patient refill of Restoril For the renal insufficiency, she is followed by her nephrologist. For hypertension, I strongly advised the patient to take her blood pressure medication as prescribed and to reconsult with her primary care physician for adjustment of her medication. The patient mentioned that her blood pressure at home is much lower than what is reported in the clinic today. She will come back for follow-up visit in 3 weeks for reevaluation and management of any adverse effect of her treatment before starting cycle #2. The patient was advised to call immediately if she has any concerning symptoms in the interval. The patient voices understanding of current disease status and treatment options and is in agreement with the current care plan.  All questions were answered. The patient knows to call the clinic with any problems, questions or concerns. We can certainly see the patient much sooner if necessary.  Disclaimer: This note was dictated with voice recognition software. Similar sounding words can inadvertently be transcribed and may not be corrected upon review.

## 2015-12-01 ENCOUNTER — Telehealth: Payer: Self-pay | Admitting: *Deleted

## 2015-12-01 NOTE — Telephone Encounter (Signed)
"  Calling to confirm I've decided to receive the chemotherapy scheduled Thursday 12-03-2015."  Please let Dr. Julien Nordmann know." Will notify Dr. Julien Nordmann.  Reviewed side effects of diarrhea, rash, itching with use of the Nivolumab.  Reports occasional use of laxatives for constipation.

## 2015-12-03 ENCOUNTER — Other Ambulatory Visit (HOSPITAL_BASED_OUTPATIENT_CLINIC_OR_DEPARTMENT_OTHER): Payer: Medicare Other

## 2015-12-03 ENCOUNTER — Ambulatory Visit (HOSPITAL_BASED_OUTPATIENT_CLINIC_OR_DEPARTMENT_OTHER): Payer: Medicare Other

## 2015-12-03 VITALS — BP 166/58 | HR 69 | Temp 97.8°F

## 2015-12-03 DIAGNOSIS — C3432 Malignant neoplasm of lower lobe, left bronchus or lung: Secondary | ICD-10-CM

## 2015-12-03 DIAGNOSIS — Z5112 Encounter for antineoplastic immunotherapy: Secondary | ICD-10-CM

## 2015-12-03 DIAGNOSIS — G4700 Insomnia, unspecified: Secondary | ICD-10-CM

## 2015-12-03 DIAGNOSIS — R5382 Chronic fatigue, unspecified: Secondary | ICD-10-CM

## 2015-12-03 DIAGNOSIS — J90 Pleural effusion, not elsewhere classified: Secondary | ICD-10-CM

## 2015-12-03 DIAGNOSIS — Z79899 Other long term (current) drug therapy: Secondary | ICD-10-CM | POA: Diagnosis not present

## 2015-12-03 LAB — COMPREHENSIVE METABOLIC PANEL
ALT: 9 U/L (ref 0–55)
AST: 14 U/L (ref 5–34)
Albumin: 3.1 g/dL — ABNORMAL LOW (ref 3.5–5.0)
Alkaline Phosphatase: 80 U/L (ref 40–150)
Anion Gap: 7 mEq/L (ref 3–11)
BUN: 41.8 mg/dL — AB (ref 7.0–26.0)
CHLORIDE: 108 meq/L (ref 98–109)
CO2: 24 mEq/L (ref 22–29)
Calcium: 9.3 mg/dL (ref 8.4–10.4)
Creatinine: 1.9 mg/dL — ABNORMAL HIGH (ref 0.6–1.1)
EGFR: 24 mL/min/{1.73_m2} — AB (ref >90)
GLUCOSE: 158 mg/dL — AB (ref 70–140)
POTASSIUM: 4.2 meq/L (ref 3.5–5.1)
SODIUM: 139 meq/L (ref 136–145)
Total Bilirubin: <0.3 mg/dL (ref 0.20–1.20)
Total Protein: 6.8 g/dL (ref 6.4–8.3)

## 2015-12-03 LAB — CBC WITH DIFFERENTIAL/PLATELET
BASO%: 0.6 % (ref 0.0–2.0)
BASOS ABS: 0.1 10*3/uL (ref 0.0–0.1)
EOS%: 2.5 % (ref 0.0–7.0)
Eosinophils Absolute: 0.2 10*3/uL (ref 0.0–0.5)
HCT: 29.5 % — ABNORMAL LOW (ref 34.8–46.6)
HGB: 9.6 g/dL — ABNORMAL LOW (ref 11.6–15.9)
LYMPH%: 10.9 % — AB (ref 14.0–49.7)
MCH: 31.8 pg (ref 25.1–34.0)
MCHC: 32.6 g/dL (ref 31.5–36.0)
MCV: 97.7 fL (ref 79.5–101.0)
MONO#: 0.5 10*3/uL (ref 0.1–0.9)
MONO%: 5.4 % (ref 0.0–14.0)
NEUT#: 6.8 10*3/uL — ABNORMAL HIGH (ref 1.5–6.5)
NEUT%: 80.6 % — AB (ref 38.4–76.8)
Platelets: 233 10*3/uL (ref 145–400)
RBC: 3.02 10*6/uL — AB (ref 3.70–5.45)
RDW: 12.1 % (ref 11.2–14.5)
WBC: 8.4 10*3/uL (ref 3.9–10.3)
lymph#: 0.9 10*3/uL (ref 0.9–3.3)

## 2015-12-03 LAB — TSH: TSH: 0.538 m(IU)/L (ref 0.308–3.960)

## 2015-12-03 MED ORDER — NIVOLUMAB CHEMO INJECTION 100 MG/10ML
240.0000 mg | Freq: Once | INTRAVENOUS | Status: AC
  Administered 2015-12-03: 240 mg via INTRAVENOUS
  Filled 2015-12-03: qty 20

## 2015-12-03 MED ORDER — SODIUM CHLORIDE 0.9 % IV SOLN
Freq: Once | INTRAVENOUS | Status: AC
  Administered 2015-12-03: 11:00:00 via INTRAVENOUS

## 2015-12-03 NOTE — Progress Notes (Signed)
Ok to treat per Dr. Mohamed. 

## 2015-12-03 NOTE — Patient Instructions (Signed)
[facility] Discharge Instructions for Patients Receiving Chemotherapy  Today you received the following chemotherapy agents Nivolumab.  To help prevent nausea and vomiting after your treatment, we encourage you to take your nausea medication as prescribed.   If you develop nausea and vomiting that is not controlled by your nausea medication, call the clinic.   BELOW ARE SYMPTOMS THAT SHOULD BE REPORTED IMMEDIATELY:  *FEVER GREATER THAN 100.5 F  *CHILLS WITH OR WITHOUT FEVER  NAUSEA AND VOMITING THAT IS NOT CONTROLLED WITH YOUR NAUSEA MEDICATION  *UNUSUAL SHORTNESS OF BREATH  *UNUSUAL BRUISING OR BLEEDING  TENDERNESS IN MOUTH AND THROAT WITH OR WITHOUT PRESENCE OF ULCERS  *URINARY PROBLEMS  *BOWEL PROBLEMS  UNUSUAL RASH Items with * indicate a potential emergency and should be followed up as soon as possible.  Feel free to call the clinic you have any questions or concerns. The clinic phone number is [phone].  Please show the CHEMO ALERT CARD at check-in to the Emergency Department and triage nurse.   

## 2015-12-04 ENCOUNTER — Telehealth: Payer: Self-pay | Admitting: *Deleted

## 2015-12-04 NOTE — Telephone Encounter (Signed)
Chemotherapy follow up call placed. Pt verbalized she feels fine, no concerns at this time. Pt advised she had some coughing last night but I'm doing well.  Encouraged pt to call with any concerns.

## 2015-12-04 NOTE — Telephone Encounter (Signed)
-----   Message from Renford Dills, RN sent at 12/03/2015 10:56 AM EDT ----- Regarding: chemo f/u Mohamed 1st Nivolumab

## 2015-12-16 ENCOUNTER — Telehealth: Payer: Self-pay | Admitting: Medical Oncology

## 2015-12-16 ENCOUNTER — Other Ambulatory Visit: Payer: Self-pay | Admitting: Medical Oncology

## 2015-12-16 NOTE — Telephone Encounter (Signed)
Folic acid discontinued -pharmacy and pt notified.

## 2015-12-17 ENCOUNTER — Telehealth: Payer: Self-pay | Admitting: Internal Medicine

## 2015-12-17 ENCOUNTER — Encounter: Payer: Self-pay | Admitting: Internal Medicine

## 2015-12-17 ENCOUNTER — Other Ambulatory Visit (HOSPITAL_BASED_OUTPATIENT_CLINIC_OR_DEPARTMENT_OTHER): Payer: Medicare Other

## 2015-12-17 ENCOUNTER — Ambulatory Visit (HOSPITAL_BASED_OUTPATIENT_CLINIC_OR_DEPARTMENT_OTHER): Payer: Medicare Other

## 2015-12-17 ENCOUNTER — Ambulatory Visit (HOSPITAL_BASED_OUTPATIENT_CLINIC_OR_DEPARTMENT_OTHER): Payer: Medicare Other | Admitting: Internal Medicine

## 2015-12-17 VITALS — BP 202/53 | HR 77 | Temp 98.2°F | Resp 17 | Ht 63.0 in | Wt 132.5 lb

## 2015-12-17 DIAGNOSIS — J90 Pleural effusion, not elsewhere classified: Secondary | ICD-10-CM | POA: Diagnosis not present

## 2015-12-17 DIAGNOSIS — Z5112 Encounter for antineoplastic immunotherapy: Secondary | ICD-10-CM

## 2015-12-17 DIAGNOSIS — C3432 Malignant neoplasm of lower lobe, left bronchus or lung: Secondary | ICD-10-CM | POA: Diagnosis not present

## 2015-12-17 DIAGNOSIS — N289 Disorder of kidney and ureter, unspecified: Secondary | ICD-10-CM | POA: Diagnosis not present

## 2015-12-17 DIAGNOSIS — C7800 Secondary malignant neoplasm of unspecified lung: Secondary | ICD-10-CM

## 2015-12-17 DIAGNOSIS — I10 Essential (primary) hypertension: Secondary | ICD-10-CM

## 2015-12-17 DIAGNOSIS — G4700 Insomnia, unspecified: Secondary | ICD-10-CM

## 2015-12-17 LAB — COMPREHENSIVE METABOLIC PANEL
ALBUMIN: 3.1 g/dL — AB (ref 3.5–5.0)
ALK PHOS: 80 U/L (ref 40–150)
ALT: <9 U/L (ref 0–55)
AST: 15 U/L (ref 5–34)
Anion Gap: 10 mEq/L (ref 3–11)
BUN: 34.3 mg/dL — ABNORMAL HIGH (ref 7.0–26.0)
CO2: 23 meq/L (ref 22–29)
Calcium: 9.3 mg/dL (ref 8.4–10.4)
Chloride: 105 mEq/L (ref 98–109)
Creatinine: 2 mg/dL — ABNORMAL HIGH (ref 0.6–1.1)
EGFR: 23 mL/min/{1.73_m2} — AB (ref >90)
GLUCOSE: 121 mg/dL (ref 70–140)
POTASSIUM: 4.4 meq/L (ref 3.5–5.1)
SODIUM: 138 meq/L (ref 136–145)
Total Bilirubin: <0.3 mg/dL (ref 0.20–1.20)
Total Protein: 7 g/dL (ref 6.4–8.3)

## 2015-12-17 LAB — CBC WITH DIFFERENTIAL/PLATELET
BASO%: 0.5 % (ref 0.0–2.0)
BASOS ABS: 0 10*3/uL (ref 0.0–0.1)
EOS ABS: 0.2 10*3/uL (ref 0.0–0.5)
EOS%: 2.3 % (ref 0.0–7.0)
HCT: 28.9 % — ABNORMAL LOW (ref 34.8–46.6)
HGB: 9.4 g/dL — ABNORMAL LOW (ref 11.6–15.9)
LYMPH%: 9.3 % — AB (ref 14.0–49.7)
MCH: 31.8 pg (ref 25.1–34.0)
MCHC: 32.7 g/dL (ref 31.5–36.0)
MCV: 97.4 fL (ref 79.5–101.0)
MONO#: 0.5 10*3/uL (ref 0.1–0.9)
MONO%: 5.1 % (ref 0.0–14.0)
NEUT#: 7.5 10*3/uL — ABNORMAL HIGH (ref 1.5–6.5)
NEUT%: 82.8 % — AB (ref 38.4–76.8)
Platelets: 240 10*3/uL (ref 145–400)
RBC: 2.97 10*6/uL — AB (ref 3.70–5.45)
RDW: 12.2 % (ref 11.2–14.5)
WBC: 9 10*3/uL (ref 3.9–10.3)
lymph#: 0.8 10*3/uL — ABNORMAL LOW (ref 0.9–3.3)

## 2015-12-17 MED ORDER — CLONIDINE HCL 0.1 MG PO TABS
0.2000 mg | ORAL_TABLET | Freq: Once | ORAL | Status: DC
Start: 2015-12-17 — End: 2015-12-17
  Administered 2015-12-17: 0.2 mg via ORAL

## 2015-12-17 MED ORDER — SODIUM CHLORIDE 0.9 % IV SOLN
240.0000 mg | Freq: Once | INTRAVENOUS | Status: AC
  Administered 2015-12-17: 240 mg via INTRAVENOUS
  Filled 2015-12-17: qty 20

## 2015-12-17 MED ORDER — CLONIDINE HCL 0.1 MG PO TABS
ORAL_TABLET | ORAL | Status: AC
  Filled 2015-12-17: qty 2

## 2015-12-17 MED ORDER — SODIUM CHLORIDE 0.9 % IV SOLN: Freq: Once | INTRAVENOUS | Status: DC

## 2015-12-17 NOTE — Progress Notes (Signed)
Ok to treat with creatinine of 2.0 per Dr Julien Nordmann.

## 2015-12-17 NOTE — Telephone Encounter (Signed)
Gave and printed appt sched and avs for pt for April and May °

## 2015-12-17 NOTE — Patient Instructions (Signed)
[facility] Discharge Instructions for Patients Receiving Chemotherapy  Today you received the following chemotherapy agents Nivolumab.  To help prevent nausea and vomiting after your treatment, we encourage you to take your nausea medication as prescribed.   If you develop nausea and vomiting that is not controlled by your nausea medication, call the clinic.   BELOW ARE SYMPTOMS THAT SHOULD BE REPORTED IMMEDIATELY:  *FEVER GREATER THAN 100.5 F  *CHILLS WITH OR WITHOUT FEVER  NAUSEA AND VOMITING THAT IS NOT CONTROLLED WITH YOUR NAUSEA MEDICATION  *UNUSUAL SHORTNESS OF BREATH  *UNUSUAL BRUISING OR BLEEDING  TENDERNESS IN MOUTH AND THROAT WITH OR WITHOUT PRESENCE OF ULCERS  *URINARY PROBLEMS  *BOWEL PROBLEMS  UNUSUAL RASH Items with * indicate a potential emergency and should be followed up as soon as possible.  Feel free to call the clinic you have any questions or concerns. The clinic phone number is [phone].  Please show the CHEMO ALERT CARD at check-in to the Emergency Department and triage nurse.   

## 2015-12-17 NOTE — Progress Notes (Signed)
[facility] Telephone:([phone]) [phone]   Fax:(336) [place], [place] [address]  DIAGNOSIS: Metastatic non-small cell lung cancer, adenocarcinoma with negative EGFR mutation and negative gene translocation initially diagnosed as stage IB in February 2014 with recurrence in March 2016.   PRIOR THERAPY:  1) Status post stereotactic radiotherapy under the care of Dr. Lisbeth Renshaw completed 01/10/2013. 2) Systemic chemotherapy with carboplatin for AUC of 4 and Alimta 400 MG/M2 every 3 weeks. First dose 04/06/2015. Status post 6 cycles, last dose was given 07/23/2015 with partial response.  CURRENT THERAPY: Immunotherapy with Nivolumab 240 MG IV every 2 weeks status post 1 cycle. First dose was given 12/03/2015.  INTERVAL HISTORY: Stephanie Fletcher 82 y.o. female returns to the clinic today for follow-up visit accompanied by her 2 nieces. She was started 2 weeks ago on treatment with immunotherapy with Nivolumab status post one cycle. She tolerated the first cycle of her treatment fairly well with no significant adverse effects. She denied having any significant fever or chills. The patient denied having any chest pain, cough or hemoptysis. She denied having any nausea or vomiting. She is here today to start cycle #2.  MEDICAL HISTORY: Past Medical History  Diagnosis Date  . Hypercholesterolemia   . Hypertension   . Diverticulitis   . Gastric ulcer   . Arthritis   . Anemia   . Back injury 1998    T-12 fracture (brace)  . Nodule of left lung   . GERD (gastroesophageal reflux disease)   . History of radiation therapy 12/31/2012-01/10/2013    60 gray to left lower lung  . Peripheral vascular disease ([place]) 1957/1997    DVT bilateral following fall  . Hx of cancer of lung 2014    tx with radiation only  . Numbness     left lower leg since left total knee nerve damage  . History of kidney  stones     x1 attack many yrs ago  . Difficulty sleeping   . Cancer [facility]) 2014    adenocarcinoma/  lung  . Diabetes mellitus without complication (HCC)     type 2-metformin  . Antineoplastic chemotherapy induced anemia 06/12/2015  . Primary cancer of left lower lobe of lung ([place]) 12/17/2012  . Fatigue 11/26/2015  . Pleural effusion, left 11/26/2015    ALLERGIES:  is allergic to actos; ezetimibe-simvastatin; and fosamax.  MEDICATIONS:  Current Outpatient Prescriptions  Medication Sig Dispense Refill  . acetaminophen (TYLENOL) 500 MG tablet Take 1,000 mg by mouth at bedtime as needed for mild pain. Reported on 10/12/2015    . aspirin EC 81 MG tablet Take 81 mg by mouth. Reported on 10/12/2015    . bisacodyl (DULCOLAX) 10 MG suppository Place 1 suppository (10 mg total) rectally daily as needed for moderate constipation. 12 suppository 0  . cyanocobalamin (,VITAMIN B-12,) 1000 MCG/ML injection Inject 1,000 mg into the muscle every 30 (thirty) days.  0  . dicyclomine (BENTYL) 20 MG tablet Take 20 mg by mouth every 6 (six) hours as needed (for stomach).    . docusate sodium (COLACE) 100 MG capsule Take 1 capsule (100 mg total) by mouth 2 (two) times daily. 10 capsule 0  . folic acid (FOLVITE) 1 MG tablet   0  . glimepiride (AMARYL) 4 MG tablet Take 4 mg by mouth daily.  0  . glucose blood (ONE TOUCH ULTRA TEST) test strip Use 2 (  two) times daily. Use as instructed.    [name] JANUVIA 25 MG tablet Take 25 mg by mouth daily.  0  . menthol-cetylpyridinium (CEPACOL) 3 MG lozenge Take 1 lozenge by mouth as needed for sore throat.    . Multiple Vitamins-Minerals (PRESERVISION AREDS) TABS Take 1 each by mouth.    . ondansetron (ZOFRAN) 4 MG tablet Take 1 tablet (4 mg total) by mouth every 6 (six) hours as needed for nausea. 40 tablet 0  . pantoprazole (PROTONIX) 40 MG tablet Take 40 mg by mouth daily.    . pioglitazone (ACTOS) 15 MG tablet Take 15 mg by mouth daily with breakfast.     . polyethylene glycol  (MIRALAX / GLYCOLAX) packet Take 17 g by mouth daily as needed (constipation).    . pravastatin (PRAVACHOL) 20 MG tablet Take 20 mg by mouth at bedtime.    . sodium bicarbonate 650 MG tablet Take 1 tablet by mouth 2 (two) times daily.  0  . temazepam (RESTORIL) 30 MG capsule Take 1 capsule (30 mg total) by mouth at bedtime as needed for sleep. 30 capsule 0  . valsartan (DIOVAN) 160 MG tablet Take 1 tablet by mouth daily.  0   No current facility-administered medications for this visit.    SURGICAL HISTORY:  Past Surgical History  Procedure Laterality Date  . Abdominal hysterectomy  1979  . Hernia repair Right 1972  . Eye surgery Bilateral 2007, 2008    cataracts  . Appendectomy  1949  . Breast biopsy Bilateral [id number]  . Carpal tunnel release Bilateral 1997  . Skin graft  1955    right groin  . Cholecystectomy    . Video bronchoscopy with endobronchial navigation N/A 11/22/2012    Procedure: VIDEO BRONCHOSCOPY WITH ENDOBRONCHIAL NAVIGATION;  Surgeon: Melrose Nakayama, MD;  Location: [facility];  Service: Thoracic;  Laterality: N/A;  . Total knee arthroplasty Left 04/22/2013    Procedure: LEFT TOTAL KNEE ARTHROPLASTY;  Surgeon: Gearlean Alf, MD;  Location: [facility];  Service: Orthopedics;  Laterality: Left;  . Superficial peroneal nerve release Left 07/19/2013    Procedure: LEFT PERONEAL NERVE DECOMPRESSION ;  Surgeon: Gearlean Alf, MD;  Location: [facility];  Service: Orthopedics;  Laterality: Left;  . Total knee arthroplasty Right 10/06/2014    Procedure: RIGHT TOTAL KNEE ARTHROPLASTY;  Surgeon: Gearlean Alf, MD;  Location: [facility];  Service: Orthopedics;  Laterality: Right;    REVIEW OF SYSTEMS:  A comprehensive review of systems was negative except for: Constitutional: positive for fatigue Respiratory: positive for dyspnea on exertion Musculoskeletal: positive for muscle weakness   PHYSICAL EXAMINATION: General appearance: alert, cooperative and no distress Head: Normocephalic,  without obvious abnormality, atraumatic Neck: no adenopathy, no JVD, supple, symmetrical, trachea midline and thyroid not enlarged, symmetric, no tenderness/mass/nodules Lymph nodes: Cervical, supraclavicular, and axillary nodes normal. Resp: clear to auscultation bilaterally Back: symmetric, no curvature. ROM normal. No CVA tenderness. Cardio: regular rate and rhythm, S1, S2 normal, no murmur, click, rub or gallop GI: soft, non-tender; bowel sounds normal; no masses,  no organomegaly Extremities: extremities normal, atraumatic, no cyanosis or edema Neurologic: Alert and oriented X 3, normal strength and tone. Normal symmetric reflexes. Normal coordination and gait  ECOG PERFORMANCE STATUS: 1 - Symptomatic but completely ambulatory  Blood pressure 202/53, pulse 77, temperature 98.2 F (36.8 C), temperature source Oral, resp. rate 17, height _0  (1.6 m), weight 132 lb 8 oz (60.102 kg), SpO2 97 %.  LABORATORY DATA: Lab Results  Component Value Date   WBC 9.0 12/17/2015   HGB 9.4* 12/17/2015   HCT 28.9* 12/17/2015   MCV 97.4 12/17/2015   PLT 240 12/17/2015      Chemistry      Component Value Date/Time   NA 139 12/03/2015 0936   NA 135 05/12/2015 2201   NA 141 07/04/2014 1816   K 4.2 12/03/2015 0936   K 4.7 05/12/2015 2201   K 4.6 07/04/2014 1816   CL 105 05/12/2015 2201   CL 108* 07/04/2014 1816   CO2 24 12/03/2015 0936   CO2 24 05/12/2015 2201   CO2 28 07/04/2014 1816   BUN 41.8* 12/03/2015 0936   BUN 35* 05/12/2015 2201   BUN 35* 07/04/2014 1816   CREATININE 1.9* 12/03/2015 0936   CREATININE 1.18* 05/12/2015 2201   CREATININE 1.01 07/04/2014 1816      Component Value Date/Time   CALCIUM 9.3 12/03/2015 0936   CALCIUM 8.5* 05/12/2015 2201   CALCIUM 9.1 07/04/2014 1816   ALKPHOS 80 12/03/2015 0936   ALKPHOS 51 09/29/2014 1410   ALKPHOS 52 07/04/2014 1816   AST 14 12/03/2015 0936   AST 18 09/29/2014 1410   AST 15 07/04/2014 1816   ALT 9 12/03/2015 0936   ALT 11  09/29/2014 1410   ALT 18 07/04/2014 1816   BILITOT <0.30 12/03/2015 0936   BILITOT 0.3 09/29/2014 1410   BILITOT 0.1* 07/04/2014 1816       RADIOGRAPHIC STUDIES: Ct Abdomen Pelvis Wo Contrast  11/17/2015  CLINICAL DATA:  82 year old female with history of left lower lobe lung cancer. Restaging examination. EXAM: CT CHEST, ABDOMEN AND PELVIS WITHOUT CONTRAST TECHNIQUE: Multidetector CT imaging of the chest, abdomen and pelvis was performed following the standard protocol without IV contrast. COMPARISON:  Multiple priors, most recently CT of the chest, abdomen and pelvis 08/18/2015. FINDINGS: CT CHEST FINDINGS Mediastinum/Lymph Nodes: Heart size is borderline enlarged. There is no significant pericardial fluid, thickening or pericardial calcification. There is atherosclerosis of the thoracic aorta, the great vessels of the mediastinum and the coronary arteries, including calcified atherosclerotic plaque in the left main, left anterior descending, left circumflex and right coronary arteries. Calcifications of the aortic valve and mitral annulus. No pathologically enlarged mediastinal or hilar lymph nodes. Please note that accurate exclusion of hilar adenopathy is limited on noncontrast CT scans. Moderate to large hiatal hernia. No axillary lymphadenopathy. Lungs/Pleura: Previously noted left lower lobe mass is difficult to discretely visualize given the lack of IV contrast on today's examination, but is estimated to measure approximately 4.7 x 5.2 cm (image 36 of series 2). Enlarging large left pleural effusion, likely malignant. Innumerable randomly distributed pulmonary nodules are again noted throughout the lungs bilaterally. These appear generally increased in size and number compared to the prior examination, suggesting progression of metastatic disease. Previously noted 6 mm right lower lobe nodule now measures up to 8 mm (image 41 of series 4). Many of these pulmonary nodules appear to be centrally  cavitary, as was evident on the prior examination. Trace right pleural effusion layering dependently. Musculoskeletal/Soft Tissues: There are again healing nondisplaced fractures of the posterolateral aspects of the left sixth, seventh and eighth ribs. There are no aggressive appearing lytic or blastic lesions noted in the visualized portions of the skeleton. CT ABDOMEN AND PELVIS FINDINGS Hepatobiliary: No definite cystic or solid hepatic lesions are identified on today's noncontrast CT examination. Status post cholecystectomy. Pancreas: No definite pancreatic mass or peripancreatic inflammatory changes on today's noncontrast CT examination. Spleen: Unremarkable.  Adrenals/Urinary Tract: Unenhanced appearance of the right kidney is normal. 1.2 cm exophytic intermediate attenuation lesion in the posterior aspect of the interpolar region of the left kidney appears slightly smaller than the prior study, and although this is incompletely characterize, this is likely to represent an involuting proteinaceous/hemorrhagic cyst. Sub cm low-attenuation lesion in the anterior aspect of the lower pole of the left kidney is too small to definitively characterize, but is similar to the prior study, favored to represent a small cyst. 3 mm nonobstructive calculus in the interpolar collecting system of the left kidney. No hydroureteronephrosis. Unenhanced appearance of the urinary bladder is normal. Low-attenuation adrenal nodules bilaterally appear unchanged, compatible with adenomas, measuring up to 1.8 cm on the left side. Stomach/Bowel: Unenhanced appearance of the intra abdominal portion of the stomach is unremarkable. No pathologic dilatation of small bowel or colon. Numerous colonic diverticulae are noted, particularly in the descending colon and sigmoid colon, without surrounding inflammatory changes to suggest an acute diverticulitis at this time. Transverse colon is very redundant. Vascular/Lymphatic: Extensive  atherosclerotic calcifications throughout the abdominal and pelvic vasculature, without definite aneurysm. No definite lymphadenopathy noted in the abdomen or pelvis on today's noncontrast CT examination. Reproductive: Status post hysterectomy. Ovaries are not confidently identified may be surgically absent or atrophic. Other: No significant volume of ascites.  No pneumoperitoneum. Musculoskeletal: Old L1 compression fracture with approximately 60% loss of height is unchanged. There are no aggressive appearing lytic or blastic lesions noted in the visualized portions of the skeleton. IMPRESSION: 1. Today's study demonstrates progression of disease as evidenced by interval increase in number and size of the innumerable metastatic pulmonary nodules throughout the lungs bilaterally. Large left lower lobe mass is roughly stable in size compared to the prior examination. There is also an enlarging large left pleural effusion which is likely malignant. Trace right pleural effusion layering dependently. 2. No definite evidence of metastatic disease to the abdomen or pelvis on today's noncontrast CT examination. 3. Colonic diverticulosis without findings to suggest an acute diverticulitis at this time. 4. Atherosclerosis, including left main and 3 vessel coronary artery disease. 5. Additional incidental findings, as above, similar to prior examinations. Electronically Signed   By: Vinnie Langton M.D.   On: 11/17/2015 13:52   Dg Chest 1 View  11/26/2015  CLINICAL DATA:  Left thoracentesis, cough history of lung carcinoma EXAM: CHEST 1 VIEW COMPARISON:  CT chest of 11/17/2015 and 08/18/2015 FINDINGS: After left thoracentesis, no pneumothorax is seen. Opacity remains at the left lung base consistent with consolidation and mass with probable small remaining left effusion. Multiple pulmonary nodules are again noted diffusely throughout the lungs. Stable cardiomegaly is noted. A moderate size hiatal hernia again is noted.  IMPRESSION: 1. No pneumothorax after left thoracentesis. 2. No change in multiple diffuse pulmonary metastasis. Electronically Signed   By: Ivar Drape M.D.   On: 11/26/2015 14:33   Ct Chest Wo Contrast  11/17/2015  CLINICAL DATA:  82 year old female with history of left lower lobe lung cancer. Restaging examination. EXAM: CT CHEST, ABDOMEN AND PELVIS WITHOUT CONTRAST TECHNIQUE: Multidetector CT imaging of the chest, abdomen and pelvis was performed following the standard protocol without IV contrast. COMPARISON:  Multiple priors, most recently CT of the chest, abdomen and pelvis 08/18/2015. FINDINGS: CT CHEST FINDINGS Mediastinum/Lymph Nodes: Heart size is borderline enlarged. There is no significant pericardial fluid, thickening or pericardial calcification. There is atherosclerosis of the thoracic aorta, the great vessels of the mediastinum and the coronary arteries, including calcified atherosclerotic  plaque in the left main, left anterior descending, left circumflex and right coronary arteries. Calcifications of the aortic valve and mitral annulus. No pathologically enlarged mediastinal or hilar lymph nodes. Please note that accurate exclusion of hilar adenopathy is limited on noncontrast CT scans. Moderate to large hiatal hernia. No axillary lymphadenopathy. Lungs/Pleura: Previously noted left lower lobe mass is difficult to discretely visualize given the lack of IV contrast on today's examination, but is estimated to measure approximately 4.7 x 5.2 cm (image 36 of series 2). Enlarging large left pleural effusion, likely malignant. Innumerable randomly distributed pulmonary nodules are again noted throughout the lungs bilaterally. These appear generally increased in size and number compared to the prior examination, suggesting progression of metastatic disease. Previously noted 6 mm right lower lobe nodule now measures up to 8 mm (image 41 of series 4). Many of these pulmonary nodules appear to be centrally  cavitary, as was evident on the prior examination. Trace right pleural effusion layering dependently. Musculoskeletal/Soft Tissues: There are again healing nondisplaced fractures of the posterolateral aspects of the left sixth, seventh and eighth ribs. There are no aggressive appearing lytic or blastic lesions noted in the visualized portions of the skeleton. CT ABDOMEN AND PELVIS FINDINGS Hepatobiliary: No definite cystic or solid hepatic lesions are identified on today's noncontrast CT examination. Status post cholecystectomy. Pancreas: No definite pancreatic mass or peripancreatic inflammatory changes on today's noncontrast CT examination. Spleen: Unremarkable. Adrenals/Urinary Tract: Unenhanced appearance of the right kidney is normal. 1.2 cm exophytic intermediate attenuation lesion in the posterior aspect of the interpolar region of the left kidney appears slightly smaller than the prior study, and although this is incompletely characterize, this is likely to represent an involuting proteinaceous/hemorrhagic cyst. Sub cm low-attenuation lesion in the anterior aspect of the lower pole of the left kidney is too small to definitively characterize, but is similar to the prior study, favored to represent a small cyst. 3 mm nonobstructive calculus in the interpolar collecting system of the left kidney. No hydroureteronephrosis. Unenhanced appearance of the urinary bladder is normal. Low-attenuation adrenal nodules bilaterally appear unchanged, compatible with adenomas, measuring up to 1.8 cm on the left side. Stomach/Bowel: Unenhanced appearance of the intra abdominal portion of the stomach is unremarkable. No pathologic dilatation of small bowel or colon. Numerous colonic diverticulae are noted, particularly in the descending colon and sigmoid colon, without surrounding inflammatory changes to suggest an acute diverticulitis at this time. Transverse colon is very redundant. Vascular/Lymphatic: Extensive  atherosclerotic calcifications throughout the abdominal and pelvic vasculature, without definite aneurysm. No definite lymphadenopathy noted in the abdomen or pelvis on today's noncontrast CT examination. Reproductive: Status post hysterectomy. Ovaries are not confidently identified may be surgically absent or atrophic. Other: No significant volume of ascites.  No pneumoperitoneum. Musculoskeletal: Old L1 compression fracture with approximately 60% loss of height is unchanged. There are no aggressive appearing lytic or blastic lesions noted in the visualized portions of the skeleton. IMPRESSION: 1. Today's study demonstrates progression of disease as evidenced by interval increase in number and size of the innumerable metastatic pulmonary nodules throughout the lungs bilaterally. Large left lower lobe mass is roughly stable in size compared to the prior examination. There is also an enlarging large left pleural effusion which is likely malignant. Trace right pleural effusion layering dependently. 2. No definite evidence of metastatic disease to the abdomen or pelvis on today's noncontrast CT examination. 3. Colonic diverticulosis without findings to suggest an acute diverticulitis at this time. 4. Atherosclerosis, including left main and  3 vessel coronary artery disease. 5. Additional incidental findings, as above, similar to prior examinations. Electronically Signed   By: Vinnie Langton M.D.   On: 11/17/2015 13:52   US Thoracentesis Asp Pleural Space W/img Guide  11/26/2015  INDICATION: Stage IB lung cancer in 2014 with findings of metastatic lung cancer in March 2016. Recent CT scan revealed a left pleural effusion. The patient has been having some shortness of breath and saw her oncologist today. A thoracentesis was ordered. EXAM: ULTRASOUND GUIDED THERAPEUTIC THORACENTESIS MEDICATIONS: 1% lidocaine COMPLICATIONS: None immediate. PROCEDURE: An ultrasound guided thoracentesis was thoroughly discussed with the  patient and questions answered. The benefits, risks, alternatives and complications were also discussed. The patient understands and wishes to proceed with the procedure. Written consent was obtained. Ultrasound was performed to localize and mark an adequate pocket of fluid in the left chest. The area was then prepped and draped in the normal sterile fashion. 1% Lidocaine was used for local anesthesia. A Safe-T-Centesis catheter was introduced. Thoracentesis was performed. The catheter was removed and a dressing applied. FINDINGS: A total of approximately 1.75 L of amber fluid was removed. IMPRESSION: Successful ultrasound guided therapeutic thoracentesis yielding 1.75 L of pleural fluid. Read by: Saverio Danker, PA-C Electronically Signed   By: Corrie Mckusick D.O.   On: 11/26/2015 15:24    ASSESSMENT AND PLAN: This is a very pleasant 82 years old white female with metastatic non-small cell lung cancer, adenocarcinoma with negative EGFR mutation and negative ALK gene translocation. She completed a course of systemic chemotherapy with carboplatin and Alimta status post 6 cycles.  The patient has been observation but the recent CT scan of the chest showed evidence for disease progression with increase in the number and size of innumerable metastatic pulmonary nodules as well as large left lower lobe mass and left pleural effusion. The patient was started on treatment with immunotherapy with Nivolumab status post 1 cycle. She tolerated the first cycle of her treatment fairly well with no significant adverse effects. I recommended for the patient to proceed with cycle #2 today as a scheduled. For insomnia, I gave the patient refill of Restoril For the renal insufficiency, she is followed by her nephrologist. For hypertension, I strongly advised the patient to take her blood pressure medication as prescribed and to reconsult with her primary care physician for adjustment of her medication. I also gave the patient  a dose of clonidine 0.2 mg by mouth 1 today She will come back for follow-up visit in 2 weeks for reevaluation and management of any adverse effect of her treatment before starting cycle #3. The patient was advised to call immediately if she has any concerning symptoms in the interval. The patient voices understanding of current disease status and treatment options and is in agreement with the current care plan.  All questions were answered. The patient knows to call the clinic with any problems, questions or concerns. We can certainly see the patient much sooner if necessary.  Disclaimer: This note was dictated with voice recognition software. Similar sounding words can inadvertently be transcribed and may not be corrected upon review.

## 2015-12-31 ENCOUNTER — Ambulatory Visit (HOSPITAL_BASED_OUTPATIENT_CLINIC_OR_DEPARTMENT_OTHER): Payer: Medicare Other

## 2015-12-31 ENCOUNTER — Other Ambulatory Visit (HOSPITAL_BASED_OUTPATIENT_CLINIC_OR_DEPARTMENT_OTHER): Payer: Medicare Other

## 2015-12-31 ENCOUNTER — Ambulatory Visit (HOSPITAL_BASED_OUTPATIENT_CLINIC_OR_DEPARTMENT_OTHER): Payer: Medicare Other | Admitting: Internal Medicine

## 2015-12-31 ENCOUNTER — Encounter: Payer: Self-pay | Admitting: Internal Medicine

## 2015-12-31 ENCOUNTER — Telehealth: Payer: Self-pay | Admitting: Internal Medicine

## 2015-12-31 ENCOUNTER — Ambulatory Visit: Payer: Medicare Other | Admitting: Nutrition

## 2015-12-31 VITALS — BP 148/58 | HR 77 | Temp 98.1°F | Resp 18 | Ht 63.0 in | Wt 131.8 lb

## 2015-12-31 DIAGNOSIS — G4700 Insomnia, unspecified: Secondary | ICD-10-CM

## 2015-12-31 DIAGNOSIS — Z79899 Other long term (current) drug therapy: Secondary | ICD-10-CM | POA: Diagnosis not present

## 2015-12-31 DIAGNOSIS — C7800 Secondary malignant neoplasm of unspecified lung: Secondary | ICD-10-CM | POA: Diagnosis not present

## 2015-12-31 DIAGNOSIS — N289 Disorder of kidney and ureter, unspecified: Secondary | ICD-10-CM

## 2015-12-31 DIAGNOSIS — Z5112 Encounter for antineoplastic immunotherapy: Secondary | ICD-10-CM

## 2015-12-31 DIAGNOSIS — J90 Pleural effusion, not elsewhere classified: Secondary | ICD-10-CM

## 2015-12-31 DIAGNOSIS — C3432 Malignant neoplasm of lower lobe, left bronchus or lung: Secondary | ICD-10-CM

## 2015-12-31 DIAGNOSIS — R5382 Chronic fatigue, unspecified: Secondary | ICD-10-CM

## 2015-12-31 DIAGNOSIS — I10 Essential (primary) hypertension: Secondary | ICD-10-CM

## 2015-12-31 LAB — CBC WITH DIFFERENTIAL/PLATELET
BASO%: 0.2 % (ref 0.0–2.0)
BASOS ABS: 0 10*3/uL (ref 0.0–0.1)
EOS ABS: 0.1 10*3/uL (ref 0.0–0.5)
EOS%: 1 % (ref 0.0–7.0)
HCT: 28.5 % — ABNORMAL LOW (ref 34.8–46.6)
HGB: 9.3 g/dL — ABNORMAL LOW (ref 11.6–15.9)
LYMPH#: 0.8 10*3/uL — AB (ref 0.9–3.3)
LYMPH%: 7.9 % — ABNORMAL LOW (ref 14.0–49.7)
MCH: 32 pg (ref 25.1–34.0)
MCHC: 32.6 g/dL (ref 31.5–36.0)
MCV: 97.9 fL (ref 79.5–101.0)
MONO#: 0.5 10*3/uL (ref 0.1–0.9)
MONO%: 4.8 % (ref 0.0–14.0)
NEUT#: 8.6 10*3/uL — ABNORMAL HIGH (ref 1.5–6.5)
NEUT%: 86.1 % — AB (ref 38.4–76.8)
PLATELETS: 242 10*3/uL (ref 145–400)
RBC: 2.91 10*6/uL — ABNORMAL LOW (ref 3.70–5.45)
RDW: 12.4 % (ref 11.2–14.5)
WBC: 10 10*3/uL (ref 3.9–10.3)

## 2015-12-31 LAB — COMPREHENSIVE METABOLIC PANEL
ALT: <9 U/L (ref 0–55)
ANION GAP: 9 meq/L (ref 3–11)
AST: 14 U/L (ref 5–34)
Albumin: 3.1 g/dL — ABNORMAL LOW (ref 3.5–5.0)
Alkaline Phosphatase: 85 U/L (ref 40–150)
BUN: 48.9 mg/dL — ABNORMAL HIGH (ref 7.0–26.0)
CALCIUM: 9.5 mg/dL (ref 8.4–10.4)
CHLORIDE: 105 meq/L (ref 98–109)
CO2: 23 mEq/L (ref 22–29)
Creatinine: 2 mg/dL — ABNORMAL HIGH (ref 0.6–1.1)
EGFR: 22 mL/min/{1.73_m2} — ABNORMAL LOW (ref >90)
Glucose: 179 mg/dl — ABNORMAL HIGH (ref 70–140)
POTASSIUM: 4.4 meq/L (ref 3.5–5.1)
Sodium: 138 mEq/L (ref 136–145)
Total Bilirubin: <0.3 mg/dL (ref 0.20–1.20)
Total Protein: 7.1 g/dL (ref 6.4–8.3)

## 2015-12-31 LAB — TSH: TSH: 0.343 m[IU]/L (ref 0.308–3.960)

## 2015-12-31 MED ORDER — SODIUM CHLORIDE 0.9 % IV SOLN
240.0000 mg | Freq: Once | INTRAVENOUS | Status: AC
  Administered 2015-12-31: 240 mg via INTRAVENOUS
  Filled 2015-12-31: qty 20

## 2015-12-31 MED ORDER — SODIUM CHLORIDE 0.9 % IV SOLN
Freq: Once | INTRAVENOUS | Status: AC
  Administered 2015-12-31: 13:00:00 via INTRAVENOUS

## 2015-12-31 NOTE — Patient Instructions (Signed)
[facility] Discharge Instructions for Patients Receiving Chemotherapy  Today you received the following chemotherapy agents Opdivio.  To help prevent nausea and vomiting after your treatment, we encourage you to take your nausea medication as prescribed.   If you develop nausea and vomiting that is not controlled by your nausea medication, call the clinic.   BELOW ARE SYMPTOMS THAT SHOULD BE REPORTED IMMEDIATELY:  *FEVER GREATER THAN 100.5 F  *CHILLS WITH OR WITHOUT FEVER  NAUSEA AND VOMITING THAT IS NOT CONTROLLED WITH YOUR NAUSEA MEDICATION  *UNUSUAL SHORTNESS OF BREATH  *UNUSUAL BRUISING OR BLEEDING  TENDERNESS IN MOUTH AND THROAT WITH OR WITHOUT PRESENCE OF ULCERS  *URINARY PROBLEMS  *BOWEL PROBLEMS  UNUSUAL RASH Items with * indicate a potential emergency and should be followed up as soon as possible.  Feel free to call the clinic you have any questions or concerns. The clinic phone number is [phone].  Please show the [place] at check-in to the Emergency Department and triage nurse.

## 2015-12-31 NOTE — Telephone Encounter (Signed)
Gave pt appt for may & avs

## 2015-12-31 NOTE — Progress Notes (Signed)
82 year old female diagnosed with metastatic lung cancer.  She is a patient of Dr. Julien Nordmann.  Past medical history includes hypercholesterolemia, hypertension, diverticulitis, GERD, PVD, diabetes type 2, kidney stones.  Medications include Dulcolax, vitamin B12, Colace, Folvite, Amaryl, multivitamin, Zofran, Protonix, Actos, MiraLAX, and Restoril.  Labs include glucose 179 and albumin 3.1.  Patient reports CBGs between 130 and 200.  Height: 63 inches. Weight: 131.8 pounds. Usual body weight: 150 pounds. BMI: 23.35.  Patient concerned that she has elevated blood sugars. Patient's diabetic medications have been reduced secondary to kidney disease. Dietary recall reveals patient consumes very few carbohydrates. Patient does try to eat 3 meals daily with a bedtime snack. Patient has experienced weight loss.  Nutrition diagnosis:  Food and nutrition related knowledge deficit related to metastatic lung cancer and associated treatments as evidenced by no prior need for nutrition related information.  Intervention:  Patient was educated to consume 3 meals with snacks as tolerated. Encouraged patient to try oral nutrition supplements designed for people with diabetes, such as Glucerna or boost glucose control. Encouraged patient to add additional protein and fats to meals and snacks. Provided samples of oral nutrition supplements along with coupons. Provided fact sheets on increasing calories and protein along with my contact information. Questions were answered.  Teach back method used.  Monitoring, evaluation, goals: Patient will tolerate adequate calories and protein to promote weight maintenance and achieve adequate glycemic control.  Next visit: Thursday, May 18, during infusion.  **Disclaimer: This note was dictated with voice recognition software. Similar sounding words can inadvertently be transcribed and this note may contain transcription errors which may not have been corrected  upon publication of note.**

## 2015-12-31 NOTE — Progress Notes (Signed)
[place] [phone]   Fax:(336) [place], MD [place] [address]  DIAGNOSIS: Metastatic non-small cell lung cancer, adenocarcinoma with negative EGFR mutation and negative gene translocation initially diagnosed as stage IB in February 2014 with recurrence in March 2016.   PRIOR THERAPY:  1) Status post stereotactic radiotherapy under the care of Dr. Lisbeth Renshaw completed 01/10/2013. 2) Systemic chemotherapy with carboplatin for AUC of 4 and Alimta 400 MG/M2 every 3 weeks. First dose 04/06/2015. Status post 6 cycles, last dose was given 07/23/2015 with partial response.  CURRENT THERAPY: Immunotherapy with Nivolumab 240 MG IV every 2 weeks status post 2 cycles. First dose was given 12/03/2015.  INTERVAL HISTORY: Stephanie Fletcher 82 y.o. female returns to the clinic today for follow-up visit accompanied by her niece, Stephanie Fletcher. She is tolerating her treatment with immunotherapy with Nivolumab status post 2 cycles. She had 1-2 episodes of diarrhea recently. She denied having any significant fever or chills. The patient denied having any chest pain, cough or hemoptysis. She denied having any nausea or vomiting. She is here today to start cycle #3.  MEDICAL HISTORY: Past Medical History  Diagnosis Date  . Hypercholesterolemia   . Hypertension   . Diverticulitis   . Gastric ulcer   . Arthritis   . Anemia   . Back injury 1998    T-12 fracture (brace)  . Nodule of left lung   . GERD (gastroesophageal reflux disease)   . History of radiation therapy 12/31/2012-01/10/2013    60 gray to left lower lung  . Peripheral vascular disease ([place]) 1957/1997    DVT bilateral following fall  . Hx of cancer of lung 2014    tx with radiation only  . Numbness     left lower leg since left total knee nerve damage  . History of kidney stones     x1 attack many yrs ago  . Difficulty sleeping     . Cancer [facility]) 2014    adenocarcinoma/  lung  . Diabetes mellitus without complication (HCC)     type 2-metformin  . Antineoplastic chemotherapy induced anemia 06/12/2015  . Primary cancer of left lower lobe of lung ([place]) 12/17/2012  . Fatigue 11/26/2015  . Pleural effusion, left 11/26/2015    ALLERGIES:  is allergic to actos; ezetimibe-simvastatin; and fosamax.  MEDICATIONS:  Current Outpatient Prescriptions  Medication Sig Dispense Refill  . aspirin EC 81 MG tablet Take 81 mg by mouth. Reported on 10/12/2015    . cyanocobalamin (,VITAMIN B-12,) 1000 MCG/ML injection Inject 1,000 mg into the muscle every 30 (thirty) days.  0  . dicyclomine (BENTYL) 20 MG tablet Take 20 mg by mouth every 6 (six) hours as needed (for stomach).    . docusate sodium (COLACE) 100 MG capsule Take 1 capsule (100 mg total) by mouth 2 (two) times daily. 10 capsule 0  . folic acid (FOLVITE) 1 MG tablet   0  . glimepiride (AMARYL) 4 MG tablet Take 4 mg by mouth daily.  0  . glucose blood (ONE TOUCH ULTRA TEST) test strip Use 2 (two) times daily. Use as instructed.    [name] JANUVIA 25 MG tablet Take 25 mg by mouth daily.  0  . menthol-cetylpyridinium (CEPACOL) 3 MG lozenge Take 1 lozenge by mouth as needed for sore throat.    . Multiple Vitamins-Minerals (PRESERVISION AREDS) TABS Take 1 each by mouth.    [name]  Nutritional Supplements (DIABETISHIELD) LIQD Take 1 Can by mouth 3 (three) times daily.    . pantoprazole (PROTONIX) 40 MG tablet Take 40 mg by mouth daily.    . pioglitazone (ACTOS) 15 MG tablet Take 15 mg by mouth daily with breakfast.     . polyethylene glycol (MIRALAX / GLYCOLAX) packet Take 17 g by mouth daily as needed (constipation).    . pravastatin (PRAVACHOL) 20 MG tablet Take 20 mg by mouth at bedtime.    . sodium bicarbonate 650 MG tablet Take 1 tablet by mouth 2 (two) times daily.  0  . temazepam (RESTORIL) 30 MG capsule Take 1 capsule (30 mg total) by mouth at bedtime as needed for sleep. 30 capsule 0  .  valsartan (DIOVAN) 160 MG tablet Take 1 tablet by mouth daily.  0  . acetaminophen (TYLENOL) 500 MG tablet Take 1,000 mg by mouth at bedtime as needed for mild pain. Reported on 12/31/2015    . bisacodyl (DULCOLAX) 10 MG suppository Place 1 suppository (10 mg total) rectally daily as needed for moderate constipation. (Patient not taking: Reported on 12/31/2015) 12 suppository 0  . ondansetron (ZOFRAN) 4 MG tablet Take 1 tablet (4 mg total) by mouth every 6 (six) hours as needed for nausea. (Patient not taking: Reported on 12/31/2015) 40 tablet 0   No current facility-administered medications for this visit.    SURGICAL HISTORY:  Past Surgical History  Procedure Laterality Date  . Abdominal hysterectomy  1979  . Hernia repair Right 1972  . Eye surgery Bilateral 2007, 2008    cataracts  . Appendectomy  1949  . Breast biopsy Bilateral [id number]  . Carpal tunnel release Bilateral 1997  . Skin graft  1955    right groin  . Cholecystectomy    . Video bronchoscopy with endobronchial navigation N/A 11/22/2012    Procedure: VIDEO BRONCHOSCOPY WITH ENDOBRONCHIAL NAVIGATION;  Surgeon: Melrose Nakayama, MD;  Location: [place];  Service: Thoracic;  Laterality: N/A;  . Total knee arthroplasty Left 04/22/2013    Procedure: LEFT TOTAL KNEE ARTHROPLASTY;  Surgeon: Gearlean Alf, MD;  Location: [facility];  Service: Orthopedics;  Laterality: Left;  . Superficial peroneal nerve release Left 07/19/2013    Procedure: LEFT PERONEAL NERVE DECOMPRESSION ;  Surgeon: Gearlean Alf, MD;  Location: [facility];  Service: Orthopedics;  Laterality: Left;  . Total knee arthroplasty Right 10/06/2014    Procedure: RIGHT TOTAL KNEE ARTHROPLASTY;  Surgeon: Gearlean Alf, MD;  Location: [facility];  Service: Orthopedics;  Laterality: Right;    REVIEW OF SYSTEMS:  A comprehensive review of systems was negative except for: Constitutional: positive for fatigue Respiratory: positive for dyspnea on exertion Musculoskeletal: positive  for muscle weakness   PHYSICAL EXAMINATION: General appearance: alert, cooperative and no distress Head: Normocephalic, without obvious abnormality, atraumatic Neck: no adenopathy, no JVD, supple, symmetrical, trachea midline and thyroid not enlarged, symmetric, no tenderness/mass/nodules Lymph nodes: Cervical, supraclavicular, and axillary nodes normal. Resp: clear to auscultation bilaterally Back: symmetric, no curvature. ROM normal. No CVA tenderness. Cardio: regular rate and rhythm, S1, S2 normal, no murmur, click, rub or gallop GI: soft, non-tender; bowel sounds normal; no masses,  no organomegaly Extremities: extremities normal, atraumatic, no cyanosis or edema Neurologic: Alert and oriented X 3, normal strength and tone. Normal symmetric reflexes. Normal coordination and gait  ECOG PERFORMANCE STATUS: 1 - Symptomatic but completely ambulatory  Blood pressure 148/58, pulse 77, temperature 98.1 F (36.7 C), temperature source Oral, resp. rate 18, height 5'  3" (1.6 m), weight 131 lb 12.8 oz (59.784 kg), SpO2 96 %.  LABORATORY DATA: Lab Results  Component Value Date   WBC 10.0 12/31/2015   HGB 9.3* 12/31/2015   HCT 28.5* 12/31/2015   MCV 97.9 12/31/2015   PLT 242 12/31/2015      Chemistry      Component Value Date/Time   NA 138 12/31/2015 1055   NA 135 05/12/2015 2201   NA 141 07/04/2014 1816   K 4.4 12/31/2015 1055   K 4.7 05/12/2015 2201   K 4.6 07/04/2014 1816   CL 105 05/12/2015 2201   CL 108* 07/04/2014 1816   CO2 23 12/31/2015 1055   CO2 24 05/12/2015 2201   CO2 28 07/04/2014 1816   BUN 48.9* 12/31/2015 1055   BUN 35* 05/12/2015 2201   BUN 35* 07/04/2014 1816   CREATININE 2.0* 12/31/2015 1055   CREATININE 1.18* 05/12/2015 2201   CREATININE 1.01 07/04/2014 1816      Component Value Date/Time   CALCIUM 9.5 12/31/2015 1055   CALCIUM 8.5* 05/12/2015 2201   CALCIUM 9.1 07/04/2014 1816   ALKPHOS 85 12/31/2015 1055   ALKPHOS 51 09/29/2014 1410   ALKPHOS 52  07/04/2014 1816   AST 14 12/31/2015 1055   AST 18 09/29/2014 1410   AST 15 07/04/2014 1816   ALT <9 12/31/2015 1055   ALT 11 09/29/2014 1410   ALT 18 07/04/2014 1816   BILITOT <0.30 12/31/2015 1055   BILITOT 0.3 09/29/2014 1410   BILITOT 0.1* 07/04/2014 1816       RADIOGRAPHIC STUDIES: No results found.  ASSESSMENT AND PLAN: This is a very pleasant 82 years old white female with metastatic non-small cell lung cancer, adenocarcinoma with negative EGFR mutation and negative ALK gene translocation. She completed a course of systemic chemotherapy with carboplatin and Alimta status post 6 cycles.  The patient has been observation but the recent CT scan of the chest showed evidence for disease progression with increase in the number and size of innumerable metastatic pulmonary nodules as well as large left lower lobe mass and left pleural effusion. The patient was started on treatment with immunotherapy with Nivolumab status post 2 cycles. She tolerated the second cycle of her treatment fairly well with no significant adverse effects. I recommended for the patient to proceed with cycle #3 today as a scheduled. For insomnia, I gave the patient refill of Restoril For the renal insufficiency, she is followed by her nephrologist. For hypertension, I strongly advised the patient to take her blood pressure medication as prescribed and to reconsult with her primary care physician for adjustment of her medication.  She will come back for follow-up visit in 2 weeks for reevaluation and management of any adverse effect of her treatment before starting cycle #4. The patient was advised to call immediately if she has any concerning symptoms in the interval. The patient voices understanding of current disease status and treatment options and is in agreement with the current care plan.  All questions were answered. The patient knows to call the clinic with any problems, questions or concerns. We can  certainly see the patient much sooner if necessary.  Disclaimer: This note was dictated with voice recognition software. Similar sounding words can inadvertently be transcribed and may not be corrected upon review.

## 2016-01-14 ENCOUNTER — Ambulatory Visit (HOSPITAL_BASED_OUTPATIENT_CLINIC_OR_DEPARTMENT_OTHER): Payer: Medicare Other

## 2016-01-14 ENCOUNTER — Telehealth: Payer: Self-pay | Admitting: Internal Medicine

## 2016-01-14 ENCOUNTER — Ambulatory Visit (HOSPITAL_BASED_OUTPATIENT_CLINIC_OR_DEPARTMENT_OTHER): Payer: Medicare Other | Admitting: Internal Medicine

## 2016-01-14 ENCOUNTER — Encounter: Payer: Self-pay | Admitting: Internal Medicine

## 2016-01-14 ENCOUNTER — Other Ambulatory Visit (HOSPITAL_BASED_OUTPATIENT_CLINIC_OR_DEPARTMENT_OTHER): Payer: Medicare Other

## 2016-01-14 VITALS — BP 178/53 | HR 61 | Temp 98.2°F | Resp 17 | Ht 63.0 in | Wt 131.6 lb

## 2016-01-14 DIAGNOSIS — C3432 Malignant neoplasm of lower lobe, left bronchus or lung: Secondary | ICD-10-CM

## 2016-01-14 DIAGNOSIS — G4700 Insomnia, unspecified: Secondary | ICD-10-CM

## 2016-01-14 DIAGNOSIS — N289 Disorder of kidney and ureter, unspecified: Secondary | ICD-10-CM

## 2016-01-14 DIAGNOSIS — Z5112 Encounter for antineoplastic immunotherapy: Secondary | ICD-10-CM | POA: Diagnosis not present

## 2016-01-14 DIAGNOSIS — R0609 Other forms of dyspnea: Secondary | ICD-10-CM

## 2016-01-14 DIAGNOSIS — J90 Pleural effusion, not elsewhere classified: Secondary | ICD-10-CM

## 2016-01-14 DIAGNOSIS — I10 Essential (primary) hypertension: Secondary | ICD-10-CM

## 2016-01-14 DIAGNOSIS — C7800 Secondary malignant neoplasm of unspecified lung: Secondary | ICD-10-CM | POA: Diagnosis not present

## 2016-01-14 LAB — COMPREHENSIVE METABOLIC PANEL
ALK PHOS: 77 U/L (ref 40–150)
ALT: 9 U/L (ref 0–55)
AST: 15 U/L (ref 5–34)
Albumin: 2.9 g/dL — ABNORMAL LOW (ref 3.5–5.0)
Anion Gap: 8 mEq/L (ref 3–11)
BUN: 37.4 mg/dL — AB (ref 7.0–26.0)
CALCIUM: 9.2 mg/dL (ref 8.4–10.4)
CHLORIDE: 107 meq/L (ref 98–109)
CO2: 23 mEq/L (ref 22–29)
Creatinine: 1.9 mg/dL — ABNORMAL HIGH (ref 0.6–1.1)
EGFR: 24 mL/min/{1.73_m2} — AB (ref >90)
Glucose: 168 mg/dl — ABNORMAL HIGH (ref 70–140)
POTASSIUM: 4.4 meq/L (ref 3.5–5.1)
Sodium: 139 mEq/L (ref 136–145)
Total Bilirubin: <0.3 mg/dL (ref 0.20–1.20)
Total Protein: 6.7 g/dL (ref 6.4–8.3)

## 2016-01-14 LAB — CBC WITH DIFFERENTIAL/PLATELET
BASO%: 1 % (ref 0.0–2.0)
BASOS ABS: 0.1 10*3/uL (ref 0.0–0.1)
EOS ABS: 0.1 10*3/uL (ref 0.0–0.5)
EOS%: 1.5 % (ref 0.0–7.0)
HEMATOCRIT: 27.9 % — AB (ref 34.8–46.6)
HGB: 9 g/dL — ABNORMAL LOW (ref 11.6–15.9)
LYMPH#: 0.8 10*3/uL — AB (ref 0.9–3.3)
LYMPH%: 8.3 % — ABNORMAL LOW (ref 14.0–49.7)
MCH: 30.9 pg (ref 25.1–34.0)
MCHC: 32.2 g/dL (ref 31.5–36.0)
MCV: 96 fL (ref 79.5–101.0)
MONO#: 0.4 10*3/uL (ref 0.1–0.9)
MONO%: 3.7 % (ref 0.0–14.0)
NEUT#: 8.2 10*3/uL — ABNORMAL HIGH (ref 1.5–6.5)
NEUT%: 85.5 % — AB (ref 38.4–76.8)
Platelets: 269 10*3/uL (ref 145–400)
RBC: 2.9 10*6/uL — ABNORMAL LOW (ref 3.70–5.45)
RDW: 12.6 % (ref 11.2–14.5)
WBC: 9.6 10*3/uL (ref 3.9–10.3)

## 2016-01-14 MED ORDER — SODIUM CHLORIDE 0.9 % IV SOLN
Freq: Once | INTRAVENOUS | Status: AC
  Administered 2016-01-14: 13:00:00 via INTRAVENOUS

## 2016-01-14 MED ORDER — SODIUM CHLORIDE 0.9 % IV SOLN
240.0000 mg | Freq: Once | INTRAVENOUS | Status: AC
  Administered 2016-01-14: 240 mg via INTRAVENOUS
  Filled 2016-01-14: qty 8

## 2016-01-14 NOTE — Progress Notes (Signed)
Creatinine 1.9 today; OK to treat with Nivolumab per Dr. Julien Nordmann.

## 2016-01-14 NOTE — Progress Notes (Signed)
[place] [phone]   Fax:(336) [place], MD [place] [address]  DIAGNOSIS: Metastatic non-small cell lung cancer, adenocarcinoma with negative EGFR mutation and negative gene translocation initially diagnosed as stage IB in February 2014 with recurrence in March 2016.   PRIOR THERAPY:  1) Status post stereotactic radiotherapy under the care of Dr. Lisbeth Renshaw completed 01/10/2013. 2) Systemic chemotherapy with carboplatin for AUC of 4 and Alimta 400 MG/M2 every 3 weeks. First dose 04/06/2015. Status post 6 cycles, last dose was given 07/23/2015 with partial response.  CURRENT THERAPY: Immunotherapy with Nivolumab 240 MG IV every 2 weeks status post 3 cycles. First dose was given 12/03/2015.  INTERVAL HISTORY: Stephanie Fletcher 82 y.o. female returns to the clinic today for follow-up visit accompanied by her niece, [name]. She continues to tolerate her treatment with immunotherapy with Nivolumab status post 3 cycles. She denied having any significant fever or chills. The patient denied having any chest pain, cough or hemoptysis. She has shortness breath with exertion. She denied having any nausea, vomiting or diarrhea. She denied having any significant skin rash. She is here today to start cycle #4.  MEDICAL HISTORY: Past Medical History  Diagnosis Date  . Hypercholesterolemia   . Hypertension   . Diverticulitis   . Gastric ulcer   . Arthritis   . Anemia   . Back injury 1998    T-12 fracture (brace)  . Nodule of left lung   . GERD (gastroesophageal reflux disease)   . History of radiation therapy 12/31/2012-01/10/2013    60 gray to left lower lung  . Peripheral vascular disease ([place]) 1957/1997    DVT bilateral following fall  . Hx of cancer of lung 2014    tx with radiation only  . Numbness     left lower leg since left total knee nerve damage  . History of kidney  stones     x1 attack many yrs ago  . Difficulty sleeping   . Cancer [facility]) 2014    adenocarcinoma/  lung  . Diabetes mellitus without complication (HCC)     type 2-metformin  . Antineoplastic chemotherapy induced anemia 06/12/2015  . Primary cancer of left lower lobe of lung ([place]) 12/17/2012  . Fatigue 11/26/2015  . Pleural effusion, left 11/26/2015    ALLERGIES:  is allergic to actos; ezetimibe-simvastatin; and fosamax.  MEDICATIONS:  Current Outpatient Prescriptions  Medication Sig Dispense Refill  . acetaminophen (TYLENOL) 500 MG tablet Take 1,000 mg by mouth at bedtime as needed for mild pain. Reported on 12/31/2015    . aspirin EC 81 MG tablet Take 81 mg by mouth. Reported on 10/12/2015    . bisacodyl (DULCOLAX) 10 MG suppository Place 1 suppository (10 mg total) rectally daily as needed for moderate constipation. 12 suppository 0  . cyanocobalamin (,VITAMIN B-12,) 1000 MCG/ML injection Inject 1,000 mg into the muscle every 30 (thirty) days.  0  . dicyclomine (BENTYL) 20 MG tablet Take 20 mg by mouth every 6 (six) hours as needed (for stomach).    . docusate sodium (COLACE) 100 MG capsule Take 1 capsule (100 mg total) by mouth 2 (two) times daily. 10 capsule 0  . folic acid (FOLVITE) 1 MG tablet   0  . glimepiride (AMARYL) 4 MG tablet Take 4 mg by mouth daily.  0  . glucose blood (ONE TOUCH ULTRA TEST) test strip Use 2 (two) times daily.  Use as instructed.    [name] JANUVIA 25 MG tablet Take 25 mg by mouth daily.  0  . menthol-cetylpyridinium (CEPACOL) 3 MG lozenge Take 1 lozenge by mouth as needed for sore throat.    . Multiple Vitamins-Minerals (PRESERVISION AREDS) TABS Take 1 each by mouth.    . Nutritional Supplements (DIABETISHIELD) LIQD Take 1 Can by mouth 3 (three) times daily.    . ondansetron (ZOFRAN) 4 MG tablet Take 1 tablet (4 mg total) by mouth every 6 (six) hours as needed for nausea. 40 tablet 0  . pantoprazole (PROTONIX) 40 MG tablet Take 40 mg by mouth daily.    .  pioglitazone (ACTOS) 15 MG tablet Take 15 mg by mouth daily with breakfast.     . polyethylene glycol (MIRALAX / GLYCOLAX) packet Take 17 g by mouth daily as needed (constipation).    . pravastatin (PRAVACHOL) 20 MG tablet Take 20 mg by mouth at bedtime.    . sodium bicarbonate 650 MG tablet Take 1 tablet by mouth 2 (two) times daily.  0  . temazepam (RESTORIL) 30 MG capsule Take 1 capsule (30 mg total) by mouth at bedtime as needed for sleep. 30 capsule 0  . valsartan (DIOVAN) 160 MG tablet Take 1 tablet by mouth daily.  0   No current facility-administered medications for this visit.    SURGICAL HISTORY:  Past Surgical History  Procedure Laterality Date  . Abdominal hysterectomy  1979  . Hernia repair Right 1972  . Eye surgery Bilateral 2007, 2008    cataracts  . Appendectomy  1949  . Breast biopsy Bilateral [id number]  . Carpal tunnel release Bilateral 1997  . Skin graft  1955    right groin  . Cholecystectomy    . Video bronchoscopy with endobronchial navigation N/A 11/22/2012    Procedure: VIDEO BRONCHOSCOPY WITH ENDOBRONCHIAL NAVIGATION;  Surgeon: Melrose Nakayama, MD;  Location: [place];  Service: Thoracic;  Laterality: N/A;  . Total knee arthroplasty Left 04/22/2013    Procedure: LEFT TOTAL KNEE ARTHROPLASTY;  Surgeon: Gearlean Alf, MD;  Location: [facility];  Service: Orthopedics;  Laterality: Left;  . Superficial peroneal nerve release Left 07/19/2013    Procedure: LEFT PERONEAL NERVE DECOMPRESSION ;  Surgeon: Gearlean Alf, MD;  Location: [facility];  Service: Orthopedics;  Laterality: Left;  . Total knee arthroplasty Right 10/06/2014    Procedure: RIGHT TOTAL KNEE ARTHROPLASTY;  Surgeon: Gearlean Alf, MD;  Location[facility];  Service: Orthopedics;  Laterality: Right;    REVIEW OF SYSTEMS:  A comprehensive review of systems was negative except for: Constitutional: positive for fatigue Respiratory: positive for dyspnea on exertion Musculoskeletal: positive for muscle weakness    PHYSICAL EXAMINATION: General appearance: alert, cooperative and no distress Head: Normocephalic, without obvious abnormality, atraumatic Neck: no adenopathy, no JVD, supple, symmetrical, trachea midline and thyroid not enlarged, symmetric, no tenderness/mass/nodules Lymph nodes: Cervical, supraclavicular, and axillary nodes normal. Resp: clear to auscultation bilaterally Back: symmetric, no curvature. ROM normal. No CVA tenderness. Cardio: regular rate and rhythm, S1, S2 normal, no murmur, click, rub or gallop GI: soft, non-tender; bowel sounds normal; no masses,  no organomegaly Extremities: extremities normal, atraumatic, no cyanosis or edema Neurologic: Alert and oriented X 3, normal strength and tone. Normal symmetric reflexes. Normal coordination and gait  ECOG PERFORMANCE STATUS: 1 - Symptomatic but completely ambulatory  Blood pressure 178/53, pulse 61, temperature 98.2 F (36.8 C), temperature source Oral, resp. rate 17, height '5\' 3"'  (1.6 m), weight 131  lb 9.6 oz (59.693 kg), SpO2 97 %.  LABORATORY DATA: Lab Results  Component Value Date   WBC 9.6 01/14/2016   HGB 9.0* 01/14/2016   HCT 27.9* 01/14/2016   MCV 96.0 01/14/2016   PLT 269 01/14/2016      Chemistry      Component Value Date/Time   NA 138 12/31/2015 1055   NA 135 05/12/2015 2201   NA 141 07/04/2014 1816   K 4.4 12/31/2015 1055   K 4.7 05/12/2015 2201   K 4.6 07/04/2014 1816   CL 105 05/12/2015 2201   CL 108* 07/04/2014 1816   CO2 23 12/31/2015 1055   CO2 24 05/12/2015 2201   CO2 28 07/04/2014 1816   BUN 48.9* 12/31/2015 1055   BUN 35* 05/12/2015 2201   BUN 35* 07/04/2014 1816   CREATININE 2.0* 12/31/2015 1055   CREATININE 1.18* 05/12/2015 2201   CREATININE 1.01 07/04/2014 1816      Component Value Date/Time   CALCIUM 9.5 12/31/2015 1055   CALCIUM 8.5* 05/12/2015 2201   CALCIUM 9.1 07/04/2014 1816   ALKPHOS 85 12/31/2015 1055   ALKPHOS 51 09/29/2014 1410   ALKPHOS 52 07/04/2014 1816   AST 14  12/31/2015 1055   AST 18 09/29/2014 1410   AST 15 07/04/2014 1816   ALT <9 12/31/2015 1055   ALT 11 09/29/2014 1410   ALT 18 07/04/2014 1816   BILITOT <0.30 12/31/2015 1055   BILITOT 0.3 09/29/2014 1410   BILITOT 0.1* 07/04/2014 1816       RADIOGRAPHIC STUDIES: No results found.  ASSESSMENT AND PLAN: This is a very pleasant 82 years old white female with metastatic non-small cell lung cancer, adenocarcinoma with negative EGFR mutation and negative ALK gene translocation. She completed a course of systemic chemotherapy with carboplatin and Alimta status post 6 cycles.  The patient has been observation but the recent CT scan of the chest showed evidence for disease progression with increase in the number and size of innumerable metastatic pulmonary nodules as well as large left lower lobe mass and left pleural effusion. The patient was started on treatment with immunotherapy with Nivolumab status post 3 cycles. She tolerated the third cycle of her treatment fairly well with no significant adverse effects. I recommended for the patient to proceed with cycle #4 today as a scheduled. She will come back for follow-up visit in 2 weeks for evaluation after repeating CT scan of the chest, abdomen and pelvis for restaging of her disease before starting cycle #5. For insomnia, I gave the patient refill of Restoril For the renal insufficiency, she is followed by her nephrologist. For hypertension, I strongly advised the patient to take her blood pressure medication as prescribed and to reconsult with her primary care physician for adjustment of her medication.  The patient was advised to call immediately if she has any concerning symptoms in the interval. The patient voices understanding of current disease status and treatment options and is in agreement with the current care plan.  All questions were answered. The patient knows to call the clinic with any problems, questions or concerns. We can  certainly see the patient much sooner if necessary.  Disclaimer: This note was dictated with voice recognition software. Similar sounding words can inadvertently be transcribed and may not be corrected upon review.

## 2016-01-14 NOTE — Telephone Encounter (Signed)
Pt will p/u in tx room

## 2016-01-14 NOTE — Patient Instructions (Signed)
[facility] Discharge Instructions for Patients   Today you received the following: Nivolumab  To help prevent nausea and vomiting after your treatment, we encourage you to take your nausea medication as directed.  If you develop nausea and vomiting that is not controlled by your nausea medication, call the clinic.   BELOW ARE SYMPTOMS THAT SHOULD BE REPORTED IMMEDIATELY:  *FEVER GREATER THAN 100.5 F  *CHILLS WITH OR WITHOUT FEVER  NAUSEA AND VOMITING THAT IS NOT CONTROLLED WITH YOUR NAUSEA MEDICATION  *UNUSUAL SHORTNESS OF BREATH  *UNUSUAL BRUISING OR BLEEDING  TENDERNESS IN MOUTH AND THROAT WITH OR WITHOUT PRESENCE OF ULCERS  *URINARY PROBLEMS  *BOWEL PROBLEMS  UNUSUAL RASH Items with * indicate a potential emergency and should be followed up as soon as possible.  Feel free to call the clinic should you have any questions or concerns. The clinic phone number is [phone].  Please show the [facility] at check-in to the Emergency Department and triage nurse.

## 2016-01-21 ENCOUNTER — Other Ambulatory Visit (HOSPITAL_BASED_OUTPATIENT_CLINIC_OR_DEPARTMENT_OTHER): Payer: Medicare Other

## 2016-01-21 DIAGNOSIS — C3432 Malignant neoplasm of lower lobe, left bronchus or lung: Secondary | ICD-10-CM

## 2016-01-21 LAB — CBC WITH DIFFERENTIAL/PLATELET
BASO%: 0.3 % (ref 0.0–2.0)
Basophils Absolute: 0 10*3/uL (ref 0.0–0.1)
EOS ABS: 0.1 10*3/uL (ref 0.0–0.5)
EOS%: 1.1 % (ref 0.0–7.0)
HCT: 24.4 % — ABNORMAL LOW (ref 34.8–46.6)
HEMOGLOBIN: 7.9 g/dL — AB (ref 11.6–15.9)
LYMPH#: 0.8 10*3/uL — AB (ref 0.9–3.3)
LYMPH%: 9.9 % — ABNORMAL LOW (ref 14.0–49.7)
MCH: 31 pg (ref 25.1–34.0)
MCHC: 32.4 g/dL (ref 31.5–36.0)
MCV: 95.7 fL (ref 79.5–101.0)
MONO#: 0.5 10*3/uL (ref 0.1–0.9)
MONO%: 5.8 % (ref 0.0–14.0)
NEUT%: 82.9 % — ABNORMAL HIGH (ref 38.4–76.8)
NEUTROS ABS: 6.6 10*3/uL — AB (ref 1.5–6.5)
Platelets: 221 10*3/uL (ref 145–400)
RBC: 2.55 10*6/uL — ABNORMAL LOW (ref 3.70–5.45)
RDW: 12.7 % (ref 11.2–14.5)
WBC: 8 10*3/uL (ref 3.9–10.3)

## 2016-01-21 LAB — COMPREHENSIVE METABOLIC PANEL
ALBUMIN: 2.9 g/dL — AB (ref 3.5–5.0)
ALK PHOS: 83 U/L (ref 40–150)
ALT: <9 U/L (ref 0–55)
AST: 12 U/L (ref 5–34)
Anion Gap: 7 mEq/L (ref 3–11)
BUN: 51.2 mg/dL — AB (ref 7.0–26.0)
CO2: 25 mEq/L (ref 22–29)
Calcium: 9.1 mg/dL (ref 8.4–10.4)
Chloride: 108 mEq/L (ref 98–109)
Creatinine: 1.9 mg/dL — ABNORMAL HIGH (ref 0.6–1.1)
EGFR: 24 mL/min/{1.73_m2} — AB (ref >90)
GLUCOSE: 154 mg/dL — AB (ref 70–140)
Potassium: 4.3 mEq/L (ref 3.5–5.1)
SODIUM: 140 meq/L (ref 136–145)
TOTAL PROTEIN: 6.6 g/dL (ref 6.4–8.3)

## 2016-01-26 ENCOUNTER — Other Ambulatory Visit: Payer: Self-pay | Admitting: Internal Medicine

## 2016-01-26 ENCOUNTER — Ambulatory Visit (HOSPITAL_COMMUNITY)
Admission: RE | Admit: 2016-01-26 | Discharge: 2016-01-26 | Disposition: A | Discharge status: Home / Self Care | Payer: Medicare Other | Source: Ambulatory Visit | Attending: Internal Medicine | Admitting: Internal Medicine

## 2016-01-26 DIAGNOSIS — Z5112 Encounter for antineoplastic immunotherapy: Secondary | ICD-10-CM | POA: Insufficient documentation

## 2016-01-26 DIAGNOSIS — C3432 Malignant neoplasm of lower lobe, left bronchus or lung: Secondary | ICD-10-CM | POA: Insufficient documentation

## 2016-01-26 DIAGNOSIS — C7801 Secondary malignant neoplasm of right lung: Secondary | ICD-10-CM | POA: Insufficient documentation

## 2016-01-26 DIAGNOSIS — C7802 Secondary malignant neoplasm of left lung: Secondary | ICD-10-CM | POA: Diagnosis not present

## 2016-01-26 DIAGNOSIS — J948 Other specified pleural conditions: Secondary | ICD-10-CM | POA: Insufficient documentation

## 2016-01-27 ENCOUNTER — Telehealth: Payer: Self-pay | Admitting: *Deleted

## 2016-01-27 NOTE — Telephone Encounter (Signed)
Call received from patient returning yesterday's call from Dr. Benay Spice.  "His message says I have a fractured rib that has injured my lung.  I exercised 45 minutes yesterday.  I am not short of breath or have any pain.  What am I supposed to do?" Dr. Julien Nordmann notified of this call at this time.  Verbal order received and read back from Dr. Julien Nordmann for F/U to take place as scheduled tomrrow.  Order given to patient at this time.  Advised again as Dr. Benay Spice to report to ED if distress from S.O.B. occurs, otherwise we'll see her tomorrow.[name]

## 2016-01-28 ENCOUNTER — Ambulatory Visit: Payer: Medicare Other

## 2016-01-28 ENCOUNTER — Ambulatory Visit (HOSPITAL_BASED_OUTPATIENT_CLINIC_OR_DEPARTMENT_OTHER): Payer: Medicare Other | Admitting: Internal Medicine

## 2016-01-28 ENCOUNTER — Telehealth: Payer: Self-pay | Admitting: Medical Oncology

## 2016-01-28 ENCOUNTER — Other Ambulatory Visit (HOSPITAL_BASED_OUTPATIENT_CLINIC_OR_DEPARTMENT_OTHER): Payer: Medicare Other

## 2016-01-28 ENCOUNTER — Encounter: Payer: Medicare Other | Admitting: Nutrition

## 2016-01-28 ENCOUNTER — Encounter: Payer: Self-pay | Admitting: *Deleted

## 2016-01-28 ENCOUNTER — Encounter: Payer: Self-pay | Admitting: Internal Medicine

## 2016-01-28 VITALS — BP 201/46 | HR 67 | Temp 97.7°F | Resp 17 | Ht 63.0 in | Wt 130.3 lb

## 2016-01-28 DIAGNOSIS — R5382 Chronic fatigue, unspecified: Secondary | ICD-10-CM

## 2016-01-28 DIAGNOSIS — J90 Pleural effusion, not elsewhere classified: Secondary | ICD-10-CM

## 2016-01-28 DIAGNOSIS — C3432 Malignant neoplasm of lower lobe, left bronchus or lung: Secondary | ICD-10-CM

## 2016-01-28 DIAGNOSIS — N289 Disorder of kidney and ureter, unspecified: Secondary | ICD-10-CM | POA: Diagnosis not present

## 2016-01-28 DIAGNOSIS — C7800 Secondary malignant neoplasm of unspecified lung: Secondary | ICD-10-CM

## 2016-01-28 DIAGNOSIS — G4700 Insomnia, unspecified: Secondary | ICD-10-CM

## 2016-01-28 DIAGNOSIS — J942 Hemothorax: Secondary | ICD-10-CM | POA: Diagnosis not present

## 2016-01-28 DIAGNOSIS — I10 Essential (primary) hypertension: Secondary | ICD-10-CM

## 2016-01-28 DIAGNOSIS — Z5112 Encounter for antineoplastic immunotherapy: Secondary | ICD-10-CM

## 2016-01-28 LAB — COMPREHENSIVE METABOLIC PANEL
ALT: 9 U/L (ref 0–55)
AST: 16 U/L (ref 5–34)
Albumin: 3.3 g/dL — ABNORMAL LOW (ref 3.5–5.0)
Alkaline Phosphatase: 89 U/L (ref 40–150)
Anion Gap: 9 mEq/L (ref 3–11)
BUN: 42.6 mg/dL — ABNORMAL HIGH (ref 7.0–26.0)
CO2: 23 mEq/L (ref 22–29)
Calcium: 9.4 mg/dL (ref 8.4–10.4)
Chloride: 105 mEq/L (ref 98–109)
Creatinine: 1.7 mg/dL — ABNORMAL HIGH (ref 0.6–1.1)
EGFR: 26 mL/min/{1.73_m2} — ABNORMAL LOW (ref >90)
Glucose: 120 mg/dl (ref 70–140)
Potassium: 4.2 mEq/L (ref 3.5–5.1)
Sodium: 137 mEq/L (ref 136–145)
Total Bilirubin: <0.3 mg/dL (ref 0.20–1.20)
Total Protein: 7.4 g/dL (ref 6.4–8.3)

## 2016-01-28 LAB — CBC WITH DIFFERENTIAL/PLATELET
BASO%: 0.6 % (ref 0.0–2.0)
Basophils Absolute: 0 10*3/uL (ref 0.0–0.1)
EOS ABS: 0.1 10*3/uL (ref 0.0–0.5)
EOS%: 1.6 % (ref 0.0–7.0)
HCT: 27.1 % — ABNORMAL LOW (ref 34.8–46.6)
HEMOGLOBIN: 8.8 g/dL — AB (ref 11.6–15.9)
LYMPH#: 0.9 10*3/uL (ref 0.9–3.3)
LYMPH%: 11.3 % — AB (ref 14.0–49.7)
MCH: 31 pg (ref 25.1–34.0)
MCHC: 32.5 g/dL (ref 31.5–36.0)
MCV: 95.2 fL (ref 79.5–101.0)
MONO#: 0.3 10*3/uL (ref 0.1–0.9)
MONO%: 4.4 % (ref 0.0–14.0)
NEUT%: 82.1 % — ABNORMAL HIGH (ref 38.4–76.8)
NEUTROS ABS: 6.2 10*3/uL (ref 1.5–6.5)
PLATELETS: 270 10*3/uL (ref 145–400)
RBC: 2.85 10*6/uL — AB (ref 3.70–5.45)
RDW: 13.1 % (ref 11.2–14.5)
WBC: 7.6 10*3/uL (ref 3.9–10.3)

## 2016-01-28 NOTE — Progress Notes (Signed)
Oncology Nurse Navigator Documentation  Oncology Nurse Navigator Flowsheets 01/28/2016  Navigator Encounter Type Other  Treatment Phase Other  Barriers/Navigation Needs Coordination of Care  Interventions Coordination of Care  Coordination of Care Appts  Acuity Level 2  Acuity Level 2 Assistance expediting appointments  Time Spent with Patient 72   Dr. Julien Nordmann requested patient be seen by thoracic surgery.  I called Harrison Mons at [facility] office and left her a vm message to call.

## 2016-01-28 NOTE — Progress Notes (Signed)
[place] [facility] [phone]   Fax:([phone]) [place], MD [place] [place] [address]  DIAGNOSIS: Metastatic non-small cell lung cancer, adenocarcinoma with negative EGFR mutation and negative gene translocation initially diagnosed as stage IB in February 2014 with recurrence in March 2016.   PRIOR THERAPY:  1) Status post stereotactic radiotherapy under the care of Dr. Lisbeth Renshaw completed 01/10/2013. 2) Systemic chemotherapy with carboplatin for AUC of 4 and Alimta 400 MG/M2 every 3 weeks. First dose 04/06/2015. Status post 6 cycles, last dose was given 07/23/2015 with partial response. 3) Immunotherapy with Nivolumab 240 MG IV every 2 weeks status post 4 cycles. First dose was given 12/03/2015.  CURRENT THERAPY: Palliative and hospice care  INTERVAL HISTORY: Stephanie Fletcher 82 y.o. female returns to the clinic today for follow-up visit accompanied by her 2 nieces. She tolerated the last cycle of her treatment with immunotherapy with Nivolumab status post 4 cycles. She denied having any significant fever or chills. The patient denied having any chest pain, cough or hemoptysis. She has shortness breath with exertion. She denied having any nausea, vomiting or diarrhea. She denied having any significant skin rash. She had repeat CT scan of the chest, abdomen and pelvis performed recently and she is here for evaluation and discussion of her scan results.  MEDICAL HISTORY: Past Medical History  Diagnosis Date  . Hypercholesterolemia   . Hypertension   . Diverticulitis   . Gastric ulcer   . Arthritis   . Anemia   . Back injury 1998    T-12 fracture (brace)  . Nodule of left lung   . GERD (gastroesophageal reflux disease)   . History of radiation therapy 12/31/2012-01/10/2013    60 gray to left lower lung  . Peripheral vascular disease ([facility]) 1957/1997    DVT bilateral following fall  . Hx of  cancer of lung 2014    tx with radiation only  . Numbness     left lower leg since left total knee nerve damage  . History of kidney stones     x1 attack many yrs ago  . Difficulty sleeping   . Cancer [facility]) 2014    adenocarcinoma/  lung  . Diabetes mellitus without complication (HCC)     type 2-metformin  . Antineoplastic chemotherapy induced anemia 06/12/2015  . Primary cancer of left lower lobe of lung ([facility]) 12/17/2012  . Fatigue 11/26/2015  . Pleural effusion, left 11/26/2015    ALLERGIES:  is allergic to actos; ezetimibe-simvastatin; and fosamax.  MEDICATIONS:  Current Outpatient Prescriptions  Medication Sig Dispense Refill  . acetaminophen (TYLENOL) 500 MG tablet Take 1,000 mg by mouth at bedtime as needed for mild pain. Reported on 12/31/2015    . aspirin EC 81 MG tablet Take 81 mg by mouth. Reported on 10/12/2015    . bisacodyl (DULCOLAX) 10 MG suppository Place 1 suppository (10 mg total) rectally daily as needed for moderate constipation. 12 suppository 0  . cyanocobalamin (,VITAMIN B-12,) 1000 MCG/ML injection Inject 1,000 mg into the muscle every 30 (thirty) days.  0  . dicyclomine (BENTYL) 20 MG tablet Take 20 mg by mouth every 6 (six) hours as needed (for stomach).    . docusate sodium (COLACE) 100 MG capsule Take 1 capsule (100 mg total) by mouth 2 (two) times daily. 10 capsule 0  . folic acid (FOLVITE) 1 MG tablet   0  . glimepiride (AMARYL) 4 MG  tablet Take 4 mg by mouth daily.  0  . glucose blood (ONE TOUCH ULTRA TEST) test strip Use 2 (two) times daily. Use as instructed.    [name] JANUVIA 25 MG tablet Take 25 mg by mouth daily.  0  . menthol-cetylpyridinium (CEPACOL) 3 MG lozenge Take 1 lozenge by mouth as needed for sore throat.    . Multiple Vitamins-Minerals (PRESERVISION AREDS) TABS Take 1 each by mouth.    . Nutritional Supplements (DIABETISHIELD) LIQD Take 1 Can by mouth 3 (three) times daily.    . ondansetron (ZOFRAN) 4 MG tablet Take 1 tablet (4 mg total) by mouth  every 6 (six) hours as needed for nausea. 40 tablet 0  . pantoprazole (PROTONIX) 40 MG tablet Take 40 mg by mouth daily.    . pioglitazone (ACTOS) 15 MG tablet Take 15 mg by mouth daily with breakfast.     . polyethylene glycol (MIRALAX / GLYCOLAX) packet Take 17 g by mouth daily as needed (constipation).    . pravastatin (PRAVACHOL) 20 MG tablet Take 20 mg by mouth at bedtime.    . sodium bicarbonate 650 MG tablet Take 1 tablet by mouth 2 (two) times daily.  0  . temazepam (RESTORIL) 30 MG capsule Take 1 capsule (30 mg total) by mouth at bedtime as needed for sleep. 30 capsule 0  . valsartan (DIOVAN) 160 MG tablet Take 1 tablet by mouth daily.  0   No current facility-administered medications for this visit.    SURGICAL HISTORY:  Past Surgical History  Procedure Laterality Date  . Abdominal hysterectomy  1979  . Hernia repair Right 1972  . Eye surgery Bilateral 2007, 2008    cataracts  . Appendectomy  1949  . Breast biopsy Bilateral [id number]  . Carpal tunnel release Bilateral 1997  . Skin graft  1955    right groin  . Cholecystectomy    . Video bronchoscopy with endobronchial navigation N/A 11/22/2012    Procedure: VIDEO BRONCHOSCOPY WITH ENDOBRONCHIAL NAVIGATION;  Surgeon: Melrose Nakayama, MD;  Location: [facility];  Service: Thoracic;  Laterality: N/A;  . Total knee arthroplasty Left 04/22/2013    Procedure: LEFT TOTAL KNEE ARTHROPLASTY;  Surgeon: Gearlean Alf, MD;  Location: [facility];  Service: Orthopedics;  Laterality: Left;  . Superficial peroneal nerve release Left 07/19/2013    Procedure: LEFT PERONEAL NERVE DECOMPRESSION ;  Surgeon: Gearlean Alf, MD;  Location: [facility];  Service: Orthopedics;  Laterality: Left;  . Total knee arthroplasty Right 10/06/2014    Procedure: RIGHT TOTAL KNEE ARTHROPLASTY;  Surgeon: Gearlean Alf, MD;  Location: [facility];  Service: Orthopedics;  Laterality: Right;    REVIEW OF SYSTEMS:  Constitutional: positive for fatigue Eyes: negative Ears,  nose, mouth, throat, and face: negative Respiratory: positive for dyspnea on exertion Cardiovascular: negative Gastrointestinal: negative Genitourinary:negative Integument/breast: negative Hematologic/lymphatic: negative Musculoskeletal:negative Neurological: negative Behavioral/Psych: negative Endocrine: negative Allergic/Immunologic: negative   PHYSICAL EXAMINATION: General appearance: alert, cooperative and no distress Head: Normocephalic, without obvious abnormality, atraumatic Neck: no adenopathy, no JVD, supple, symmetrical, trachea midline and thyroid not enlarged, symmetric, no tenderness/mass/nodules Lymph nodes: Cervical, supraclavicular, and axillary nodes normal. Resp: clear to auscultation bilaterally Back: symmetric, no curvature. ROM normal. No CVA tenderness. Cardio: regular rate and rhythm, S1, S2 normal, no murmur, click, rub or gallop GI: soft, non-tender; bowel sounds normal; no masses,  no organomegaly Extremities: extremities normal, atraumatic, no cyanosis or edema Neurologic: Alert and oriented X 3, normal strength and tone. Normal symmetric reflexes. Normal  coordination and gait  ECOG PERFORMANCE STATUS: 1 - Symptomatic but completely ambulatory  Blood pressure 201/46, pulse 67, temperature 97.7 F (36.5 C), temperature source Oral, resp. rate 17, height _0  (1.6 m), weight 130 lb 4.8 oz (59.104 kg), SpO2 98 %.  LABORATORY DATA: Lab Results  Component Value Date   WBC 7.6 01/28/2016   HGB 8.8* 01/28/2016   HCT 27.1* 01/28/2016   MCV 95.2 01/28/2016   PLT 270 01/28/2016      Chemistry      Component Value Date/Time   NA 140 01/21/2016 1118   NA 135 05/12/2015 2201   NA 141 07/04/2014 1816   K 4.3 01/21/2016 1118   K 4.7 05/12/2015 2201   K 4.6 07/04/2014 1816   CL 105 05/12/2015 2201   CL 108* 07/04/2014 1816   CO2 25 01/21/2016 1118   CO2 24 05/12/2015 2201   CO2 28 07/04/2014 1816   BUN 51.2* 01/21/2016 1118   BUN 35* 05/12/2015 2201     BUN 35* 07/04/2014 1816   CREATININE 1.9* 01/21/2016 1118   CREATININE 1.18* 05/12/2015 2201   CREATININE 1.01 07/04/2014 1816      Component Value Date/Time   CALCIUM 9.1 01/21/2016 1118   CALCIUM 8.5* 05/12/2015 2201   CALCIUM 9.1 07/04/2014 1816   ALKPHOS 83 01/21/2016 1118   ALKPHOS 51 09/29/2014 1410   ALKPHOS 52 07/04/2014 1816   AST 12 01/21/2016 1118   AST 18 09/29/2014 1410   AST 15 07/04/2014 1816   ALT <9 01/21/2016 1118   ALT 11 09/29/2014 1410   ALT 18 07/04/2014 1816   BILITOT <0.30 01/21/2016 1118   BILITOT 0.3 09/29/2014 1410   BILITOT 0.1* 07/04/2014 1816       RADIOGRAPHIC STUDIES: Ct Abdomen Pelvis Wo Contrast  01/26/2016  ADDENDUM REPORT: 01/26/2016 18:23 ADDENDUM: There are multiple rib fractures along the RIGHT lateral chest wall as seen on comparison exam. There is new medial displacement of several of the fractures (sagittal image 112, series 5). This is potentially the etiology of the pneumothorax. Findings conveyed tobrad Sherrill on 01/26/2016  at18:22. Electronically Signed   By: Suzy Bouchard M.D.   On: 01/26/2016 18:23  01/26/2016  CLINICAL DATA:  Metastatic non-small cell lung cancer. Patient status post stereotactic radiotherapy and chemotherapy. Current immunotherapy with Nivolumab. First dose 12/03/2015 EXAM: CT CHEST, ABDOMEN AND PELVIS WITHOUT CONTRAST TECHNIQUE: Multidetector CT imaging of the chest, abdomen and pelvis was performed following the standard protocol without IV contrast. COMPARISON:  CT 11/16/2005. FINDINGS: CT CHEST FINDINGS Mediastinum/Nodes: No axillary or supraclavicular adenopathy. No mediastinal or hilar adenopathy evident noncontrast exam .there is a large hiatal hernia posterior to the LEFT atrium. No pericardial fluid. Lungs/Pleura: There is a new moderate size hydro pneumothorax on the LEFT. The pneumothorax portion occupies approximately 15 to 20% percent of lung volume. There is a large RIGHT pleural effusion. There are  multiple pulmonary metastasis scattered throughout the LEFT RIGHT lung. There is consolidative mass in the LEFT lower lobe measuring 6.0 cm compared to 5.2 cm. Individual nodules have subjectively increased in number. Many these nodules are ring-like with central lucency. Some increased coalescence in the RIGHT lower lobe. Example nodule measuring 9 mm (image 71, series 3 compares 8 mm. The dominant findings increase the number of lesions. Musculoskeletal: No aggressive osseous lesion. CT ABDOMEN AND PELVIS FINDINGS Hepatobiliary:  No focal hepatic lesions noncontrast exam. Pancreas: Pancreas is normal. No ductal dilatation. No pancreatic inflammation. Spleen: Normal spleen Adrenals/urinary tract:  Adrenal glands and kidneys are normal. The ureters and bladder normal. Stomach/Bowel: Stomach, small bowel, appendix, and cecum are normal. The colon and rectosigmoid colon are normal. Vascular/Lymphatic: Abdominal aorta is normal caliber. There is no retroperitoneal or periportal lymphadenopathy. No pelvic lymphadenopathy. Reproductive: Post hysterectomy Other: No free fluid. Musculoskeletal: No aggressive osseous lesion. IMPRESSION: Chest Impression: 1. New RIGHT hydro pneumothorax. Pneumothorax occupies 15 to 20% of LEFT hemi thorax volume. Query recent thoracentesis and consider a bronchopleural fistula with the larger LEFT lower lobe mass. 2. Interval increase in number of widespread pulmonary metastasis. 3. Interval increase in size of LEFT lower lobe mass. Abdomen / Pelvis Impression: 1. No evidence of metastasis in the abdomen pelvis. 2. No evidence skeletal metastasis. Contact to physician on call physician initiated on 01/26/2016 at18:04. Electronically Signed: By: Suzy Bouchard M.D. On: 01/26/2016 18:04   Ct Chest Wo Contrast  01/26/2016  ADDENDUM REPORT: 01/26/2016 18:23 ADDENDUM: There are multiple rib fractures along the RIGHT lateral chest wall as seen on comparison exam. There is new medial  displacement of several of the fractures (sagittal image 112, series 5). This is potentially the etiology of the pneumothorax. Findings conveyed tobrad Sherrill on 01/26/2016  at18:22. Electronically Signed   By: Suzy Bouchard M.D.   On: 01/26/2016 18:23  01/26/2016  CLINICAL DATA:  Metastatic non-small cell lung cancer. Patient status post stereotactic radiotherapy and chemotherapy. Current immunotherapy with Nivolumab. First dose 12/03/2015 EXAM: CT CHEST, ABDOMEN AND PELVIS WITHOUT CONTRAST TECHNIQUE: Multidetector CT imaging of the chest, abdomen and pelvis was performed following the standard protocol without IV contrast. COMPARISON:  CT 11/16/2005. FINDINGS: CT CHEST FINDINGS Mediastinum/Nodes: No axillary or supraclavicular adenopathy. No mediastinal or hilar adenopathy evident noncontrast exam .there is a large hiatal hernia posterior to the LEFT atrium. No pericardial fluid. Lungs/Pleura: There is a new moderate size hydro pneumothorax on the LEFT. The pneumothorax portion occupies approximately 15 to 20% percent of lung volume. There is a large RIGHT pleural effusion. There are multiple pulmonary metastasis scattered throughout the LEFT RIGHT lung. There is consolidative mass in the LEFT lower lobe measuring 6.0 cm compared to 5.2 cm. Individual nodules have subjectively increased in number. Many these nodules are ring-like with central lucency. Some increased coalescence in the RIGHT lower lobe. Example nodule measuring 9 mm (image 71, series 3 compares 8 mm. The dominant findings increase the number of lesions. Musculoskeletal: No aggressive osseous lesion. CT ABDOMEN AND PELVIS FINDINGS Hepatobiliary:  No focal hepatic lesions noncontrast exam. Pancreas: Pancreas is normal. No ductal dilatation. No pancreatic inflammation. Spleen: Normal spleen Adrenals/urinary tract: Adrenal glands and kidneys are normal. The ureters and bladder normal. Stomach/Bowel: Stomach, small bowel, appendix, and cecum are  normal. The colon and rectosigmoid colon are normal. Vascular/Lymphatic: Abdominal aorta is normal caliber. There is no retroperitoneal or periportal lymphadenopathy. No pelvic lymphadenopathy. Reproductive: Post hysterectomy Other: No free fluid. Musculoskeletal: No aggressive osseous lesion. IMPRESSION: Chest Impression: 1. New RIGHT hydro pneumothorax. Pneumothorax occupies 15 to 20% of LEFT hemi thorax volume. Query recent thoracentesis and consider a bronchopleural fistula with the larger LEFT lower lobe mass. 2. Interval increase in number of widespread pulmonary metastasis. 3. Interval increase in size of LEFT lower lobe mass. Abdomen / Pelvis Impression: 1. No evidence of metastasis in the abdomen pelvis. 2. No evidence skeletal metastasis. Contact to physician on call physician initiated on 01/26/2016 at18:04. Electronically Signed: By: Suzy Bouchard M.D. On: 01/26/2016 18:04    ASSESSMENT AND PLAN: This is a very pleasant  82 years old white female with metastatic non-small cell lung cancer, adenocarcinoma with negative EGFR mutation and negative ALK gene translocation. She completed a course of systemic chemotherapy with carboplatin and Alimta status post 6 cycles.  The patient has been observation but the recent CT scan of the chest showed evidence for disease progression with increase in the number and size of innumerable metastatic pulmonary nodules as well as large left lower lobe mass and left pleural effusion. The patient was started on treatment with immunotherapy with Nivolumab status post 4 cycles. She tolerated the last cycle of her treatment fairly well with no significant adverse effects. Unfortunately the recent CT scan of the chest, abdomen and pelvis showed evidence for disease progression with new left Hydropneumothorax in addition to widespread pulmonary metastasis. The patient is currently asymptomatic from the Hydropneumothorax which is most likely secondary to recent rib  fractures. I discussed the scan results with the patient and her family. I recommended for the patient to consider palliative and hospice care at this point. I do think the patient will be a good candidate for any further aggressive chemotherapy. For the left hydropneumothorax, I referred the patient to thoracic surgery for evaluation and management of this condition. For insomnia, she will continue on Restoril For the renal insufficiency, she is followed by her nephrologist. For hypertension, I strongly advised the patient to take her blood pressure medication as prescribed and to reconsult with her primary care physician for adjustment of her medication.  I will see the patient on as-needed basis at this point. The patient was advised to call immediately if she has any concerning symptoms in the interval. The patient voices understanding of current disease status and treatment options and is in agreement with the current care plan.  All questions were answered. The patient knows to call the clinic with any problems, questions or concerns. We can certainly see the patient much sooner if necessary.  Disclaimer: This note was dictated with voice recognition software. Similar sounding words can inadvertently be transcribed and may not be corrected upon review.

## 2016-01-28 NOTE — Telephone Encounter (Signed)
Faxed referral information to [facility].

## 2016-01-29 ENCOUNTER — Telehealth: Payer: Self-pay | Admitting: *Deleted

## 2016-01-29 LAB — TSH CHCC: TSH: 0.553 u[IU]/mL (ref 0.450–4.500)

## 2016-01-29 NOTE — Telephone Encounter (Signed)
Oncology Nurse Navigator Documentation  Oncology Nurse Navigator Flowsheets 01/29/2016  Navigator Encounter Type Telephone  Telephone Outgoing Call  Barriers/Navigation Needs Coordination of Care  Interventions Coordination of Care  Coordination of Care Appts  Acuity Level 2  Time Spent with Patient 30   I called [facility] per Dr. Julien Nordmann to request patient to be seen.  I spoke with Ebony Hail and she gave me an appt for 02/09/16.  She stated if there was a cancellation on Dr. Leonarda Salon schedule, the patient appt would be changed.  Ebony Hail will notify patient if this happens.    I called patient and left vm message with appt time and place. I also left my name and phone number to call if needed.

## 2016-02-09 ENCOUNTER — Encounter: Payer: Self-pay | Admitting: Thoracic Surgery (Cardiothoracic Vascular Surgery)

## 2016-02-09 ENCOUNTER — Telehealth: Payer: Self-pay | Admitting: *Deleted

## 2016-02-09 ENCOUNTER — Ambulatory Visit (INDEPENDENT_AMBULATORY_CARE_PROVIDER_SITE_OTHER): Payer: Medicare Other | Admitting: Thoracic Surgery (Cardiothoracic Vascular Surgery)

## 2016-02-09 VITALS — BP 190/65 | HR 60 | Resp 20 | Ht 63.0 in | Wt 130.0 lb

## 2016-02-09 DIAGNOSIS — C3490 Malignant neoplasm of unspecified part of unspecified bronchus or lung: Secondary | ICD-10-CM | POA: Diagnosis not present

## 2016-02-09 DIAGNOSIS — C7989 Secondary malignant neoplasm of other specified sites: Secondary | ICD-10-CM | POA: Diagnosis not present

## 2016-02-09 DIAGNOSIS — J948 Other specified pleural conditions: Secondary | ICD-10-CM

## 2016-02-09 NOTE — Telephone Encounter (Signed)
"  I received a call about an appointment.  I think this was a mistake.  I was told Jan 28, 2016 I do not need anymore chemotherapy treatments.  Could you check on this and call me back.  [phone]"

## 2016-02-09 NOTE — Progress Notes (Signed)
[address]       [place], [address]             [phone]       HPI: 82 year old woman sent for consultation regarding a left hydropneumothorax.  [name] is an 79 year old woman with metastatic lung cancer who recently had a CT for follow-up showed a left hydropneumothorax.  I first saw her back in 2014. She had a 5 cm left lower lobe mass. She refused surgery and opted for stereotactic radiation. Dr. Lisbeth Renshaw did that over 5 treatments. She then had 6 cycles of carboplatin and Alimta by Dr. Julien Nordmann beginning in July 2016. She recently had progression of disease and was treated with 4 doses of nivolumab. She has had progression with that regimen as well.  In March she had a CT scan which showed a large left pleural effusion. On 11/26/2015 she had a thoracentesis which drained 1.75 L of amber fluid. She did not experience any symptomatic relief from that. She did experience an increase in cough after the drainage. Of note she was asymptomatic prior to the thoracentesis.  On 01/26/2016 she had a CT chest which reportedly showed a large left hydropneumothorax.  She says she feels well. She denies unusual cough, shortness of breath at rest or with exertion and orthopnea. She has not had any fevers, chills, or sweats. Past Medical History  Diagnosis Date  . Hypercholesterolemia   . Hypertension   . Diverticulitis   . Gastric ulcer   . Arthritis   . Anemia   . Back injury 1998    T-12 fracture (brace)  . Nodule of left lung   . GERD (gastroesophageal reflux disease)   . History of radiation therapy 12/31/2012-01/10/2013    60 gray to left lower lung  . Peripheral vascular disease ([place]) 1957/1997    DVT bilateral following fall  . Hx of cancer of lung 2014    tx with radiation only  . Numbness     left lower leg since left total knee nerve damage  . History of kidney stones     x1 attack many yrs ago  . Difficulty sleeping   . Cancer [facility]) 2014   adenocarcinoma/  lung  . Diabetes mellitus without complication (HCC)     type 2-metformin  . Antineoplastic chemotherapy induced anemia 06/12/2015  . Primary cancer of left lower lobe of lung ([place]) 12/17/2012  . Fatigue 11/26/2015  . Pleural effusion, left 11/26/2015   Past Surgical History  Procedure Laterality Date  . Abdominal hysterectomy  1979  . Hernia repair Right 1972  . Eye surgery Bilateral 2007, 2008    cataracts  . Appendectomy  1949  . Breast biopsy Bilateral [id number]  . Carpal tunnel release Bilateral 1997  . Skin graft  1955    right groin  . Cholecystectomy    . Video bronchoscopy with endobronchial navigation N/A 11/22/2012    Procedure: VIDEO BRONCHOSCOPY WITH ENDOBRONCHIAL NAVIGATION;  Surgeon: Melrose Nakayama, MD;  Location: [facility];  Service: Thoracic;  Laterality: N/A;  . Total knee arthroplasty Left 04/22/2013    Procedure: LEFT TOTAL KNEE ARTHROPLASTY;  Surgeon: Gearlean Alf, MD;  Location: [facility];  Service: Orthopedics;  Laterality: Left;  . Superficial peroneal nerve release Left 07/19/2013    Procedure: LEFT PERONEAL NERVE DECOMPRESSION ;  Surgeon: Gearlean Alf, MD;  Location: [facility];  Service: Orthopedics;  Laterality: Left;  . Total knee arthroplasty Right 10/06/2014  Procedure: RIGHT TOTAL KNEE ARTHROPLASTY;  Surgeon: Gearlean Alf, MD;  Location: [facility];  Service: Orthopedics;  Laterality: Right;        Current Outpatient Prescriptions  Medication Sig Dispense Refill  . acetaminophen (TYLENOL) 500 MG tablet Take 1,000 mg by mouth at bedtime as needed for mild pain. Reported on 12/31/2015    . aspirin EC 81 MG tablet Take 81 mg by mouth. Reported on 10/12/2015    . bisacodyl (DULCOLAX) 10 MG suppository Place 1 suppository (10 mg total) rectally daily as needed for moderate constipation. 12 suppository 0  . cyanocobalamin (,VITAMIN B-12,) 1000 MCG/ML injection Inject 1,000 mg into the muscle every 30 (thirty) days.  0  . dicyclomine (BENTYL)  20 MG tablet Take 20 mg by mouth every 6 (six) hours as needed (for stomach).    . docusate sodium (COLACE) 100 MG capsule Take 1 capsule (100 mg total) by mouth 2 (two) times daily. 10 capsule 0  . folic acid (FOLVITE) 1 MG tablet   0  . glimepiride (AMARYL) 4 MG tablet Take 4 mg by mouth daily.  0  . glucose blood (ONE TOUCH ULTRA TEST) test strip Use 2 (two) times daily. Use as instructed.    [name] JANUVIA 25 MG tablet Take 25 mg by mouth daily.  0  . menthol-cetylpyridinium (CEPACOL) 3 MG lozenge Take 1 lozenge by mouth as needed for sore throat.    . Multiple Vitamins-Minerals (PRESERVISION AREDS) TABS Take 1 each by mouth.    . Nutritional Supplements (DIABETISHIELD) LIQD Take 1 Can by mouth 3 (three) times daily.    . ondansetron (ZOFRAN) 4 MG tablet Take 1 tablet (4 mg total) by mouth every 6 (six) hours as needed for nausea. 40 tablet 0  . pantoprazole (PROTONIX) 40 MG tablet Take 40 mg by mouth daily.    . pioglitazone (ACTOS) 15 MG tablet Take 15 mg by mouth daily with breakfast.     . polyethylene glycol (MIRALAX / GLYCOLAX) packet Take 17 g by mouth daily as needed (constipation).    . pravastatin (PRAVACHOL) 20 MG tablet Take 20 mg by mouth at bedtime.    . sodium bicarbonate 650 MG tablet Take 1 tablet by mouth 2 (two) times daily.  0  . temazepam (RESTORIL) 30 MG capsule Take 1 capsule (30 mg total) by mouth at bedtime as needed for sleep. 30 capsule 0  . valsartan (DIOVAN) 160 MG tablet Take 1 tablet by mouth daily.  0   No current facility-administered medications for this visit.    Physical Exam BP 190/65 mmHg  Pulse 60  Resp 20  Ht '5\' 3"'$  (1.6 m)  Wt 130 lb (58.968 kg)  BMI 23.03 kg/m2  SpO84 70% 82 year old woman in no acute distress Alert and oriented 3 with no focal deficits No cervical or supraclavicular adenopathy. No crepitance or subcutaneous emphysema. Lungs with diminished breath sounds on left Cardiac regular rate and rhythm  Diagnostic Tests: ADDENDUM  REPORT: 01/26/2016 18:23 ADDENDUM: There are multiple rib fractures along the RIGHT lateral chest wall as seen on comparison exam. There is new medial displacement of several of the fractures (sagittal image 112, series 5). This is potentially the etiology of the pneumothorax. Findings conveyed tobrad Sherrill on 01/26/2016 at18:22. Electronically Signed  By: Suzy Bouchard M.D.  On: 01/26/2016 18:23     Study Result     CLINICAL DATA: Metastatic non-small cell lung cancer. Patient status post stereotactic radiotherapy and chemotherapy. Current immunotherapy with Nivolumab. First  dose 12/03/2015  EXAM: CT CHEST, ABDOMEN AND PELVIS WITHOUT CONTRAST  TECHNIQUE: Multidetector CT imaging of the chest, abdomen and pelvis was performed following the standard protocol without IV contrast.  COMPARISON: CT 11/16/2005.  FINDINGS: CT CHEST FINDINGS  Mediastinum/Nodes: No axillary or supraclavicular adenopathy. No mediastinal or hilar adenopathy evident noncontrast exam .there is a large hiatal hernia posterior to the LEFT atrium. No pericardial fluid.  Lungs/Pleura: There is a new moderate size hydro pneumothorax on the LEFT. The pneumothorax portion occupies approximately 15 to 20% percent of lung volume. There is a large RIGHT pleural effusion.  There are multiple pulmonary metastasis scattered throughout the LEFT RIGHT lung. There is consolidative mass in the LEFT lower lobe measuring 6.0 cm compared to 5.2 cm. Individual nodules have subjectively increased in number. Many these nodules are ring-like with central lucency. Some increased coalescence in the RIGHT lower lobe. Example nodule measuring 9 mm (image 71, series 3 compares 8 mm. The dominant findings increase the number of lesions.  Musculoskeletal: No aggressive osseous lesion.  CT ABDOMEN AND PELVIS FINDINGS  Hepatobiliary: No focal hepatic lesions noncontrast exam.  Pancreas: Pancreas is  normal. No ductal dilatation. No pancreatic inflammation.  Spleen: Normal spleen  Adrenals/urinary tract: Adrenal glands and kidneys are normal. The ureters and bladder normal.  Stomach/Bowel: Stomach, small bowel, appendix, and cecum are normal. The colon and rectosigmoid colon are normal.  Vascular/Lymphatic: Abdominal aorta is normal caliber. There is no retroperitoneal or periportal lymphadenopathy. No pelvic lymphadenopathy.  Reproductive: Post hysterectomy  Other: No free fluid.  Musculoskeletal: No aggressive osseous lesion.  IMPRESSION: Chest Impression:  1. New RIGHT hydro pneumothorax. Pneumothorax occupies 15 to 20% of LEFT hemi thorax volume. Query recent thoracentesis and consider a bronchopleural fistula with the larger LEFT lower lobe mass. 2. Interval increase in number of widespread pulmonary metastasis. 3. Interval increase in size of LEFT lower lobe mass.  Abdomen / Pelvis Impression:  1. No evidence of metastasis in the abdomen pelvis. 2. No evidence skeletal metastasis.  Contact to physician on call physician initiated on 01/26/2016 at18:04.  Electronically Signed: By: Suzy Bouchard M.D. On: 01/26/2016 18:04   I personally reviewed the CT chest from 01/26/2016 and compared it to the CT from 11/17/2015. There is no right hydropneumothorax is noted in the impression. She previously had a large left pleural effusion. She now has partially reaccumulated the pleural effusion and also still has a space as well.  Impression: 82 year old woman with metastatic lung cancer and a malignant left pleural effusion. She had a thoracentesis back in March which drained nearly 2 L of fluid from the chest. She now has partially reaccumulated the effusion giving a CT appearance of a "hydropneumothorax."   She is asymptomatic and denies pain and shortness of breath. I see absolutely no reason to intervene on this elderly woman with an incurable disease  other than for quality-of-life. She did not have any symptomatic benefit from thoracentesis previously and has no complaints at present, so there is no reason to believe that she would feel better if the pleural space was drained. In all likelihood the lung is trapped and would not expand from where it is now anyway.  She is in contact with hospice. She had questions about her prognosis.  I will be happy to see her again if she becomes significantly symptomatic to see what palliative measures might be possible.  Plan: Follow-up with Dr. Thad Ranger, MD [facility] [phone]

## 2016-02-10 ENCOUNTER — Telehealth: Payer: Self-pay | Admitting: *Deleted

## 2016-02-10 ENCOUNTER — Other Ambulatory Visit: Payer: Self-pay | Admitting: *Deleted

## 2016-02-10 NOTE — Telephone Encounter (Signed)
TC from patient this morning to verify that her apptointments here for tomorrow are cancelled.   Pt will start with hospice/paliative care in a week or so and will not be getting any more chemotherapy. Reviewed Dr. Worthy Flank note from 01/28/16 and pt will be seen on an as needed basis.  appts for 02/11/16 cancelled.

## 2016-02-10 NOTE — Telephone Encounter (Signed)
Message from patient, do I need to come for appts on 6/1 or should they be cancelled?

## 2016-02-11 ENCOUNTER — Ambulatory Visit: Payer: Medicare Other | Admitting: Internal Medicine

## 2016-02-11 ENCOUNTER — Encounter: Payer: Medicare Other | Admitting: Nutrition

## 2016-02-11 ENCOUNTER — Other Ambulatory Visit: Payer: Medicare Other

## 2016-02-11 ENCOUNTER — Ambulatory Visit: Payer: Medicare Other

## 2016-03-14 ENCOUNTER — Other Ambulatory Visit: Payer: Self-pay | Admitting: Nurse Practitioner

## 2016-04-04 ENCOUNTER — Telehealth: Payer: Self-pay | Admitting: *Deleted

## 2016-04-04 NOTE — Telephone Encounter (Signed)
Hospice RN called pt requested to resume hospice. Previously declined. New Referral will be faxed to [facility].

## 2016-04-15 ENCOUNTER — Telehealth: Payer: Self-pay | Admitting: *Deleted

## 2016-04-15 NOTE — Telephone Encounter (Signed)
"  Hospice order form faxed today needs signature to first page by provider."  Will notify

## 2016-04-19 ENCOUNTER — Telehealth: Payer: Self-pay | Admitting: Internal Medicine

## 2016-04-19 NOTE — Telephone Encounter (Signed)
FAXED RECORDS TO [facility]  [phone]

## 2016-08-01 ENCOUNTER — Other Ambulatory Visit: Payer: Self-pay | Admitting: Internal Medicine

## 2016-08-01 DIAGNOSIS — Z1231 Encounter for screening mammogram for malignant neoplasm of breast: Secondary | ICD-10-CM

## 2016-09-14 ENCOUNTER — Ambulatory Visit: Payer: Medicare Other

## 2016-10-03 ENCOUNTER — Other Ambulatory Visit: Payer: Self-pay | Admitting: Medical Oncology

## 2016-10-03 ENCOUNTER — Telehealth: Payer: Self-pay | Admitting: Medical Oncology

## 2016-10-03 DIAGNOSIS — R0600 Dyspnea, unspecified: Secondary | ICD-10-CM

## 2016-10-03 DIAGNOSIS — C3432 Malignant neoplasm of lower lobe, left bronchus or lung: Secondary | ICD-10-CM

## 2016-10-03 MED ORDER — MORPHINE SULFATE (CONCENTRATE) 10 MG /0.5 ML PO SOLN: 5.0000 mg | ORAL | 0 refills | Status: DC | PRN

## 2016-10-03 NOTE — Addendum Note (Signed)
Addended by: Ardeen Garland on: 10/03/2016 09:59 AM   Modules accepted: Orders

## 2016-10-03 NOTE — Telephone Encounter (Signed)
She is having more dyspnea and been taking concentrated morphine - He requests refill. Faxed to pharmacy

## 2016-10-24 ENCOUNTER — Other Ambulatory Visit: Payer: Self-pay | Admitting: Internal Medicine

## 2016-10-26 IMAGING — CT CT CHEST W/O CM
2 of 4 series · 15 of 46 positions shown, 17 images · non-contrast
Comparison: 06/09/2015

CLINICAL DATA: Followup non-small cell lung cancer.

EXAM:
CT CHEST, ABDOMEN AND PELVIS WITHOUT CONTRAST
TECHNIQUE: Multidetector CT imaging of the chest, abdomen and pelvis was
performed following the standard protocol without IV contrast.

[Series 2: cap w/o w/o st · axial · non-contrast · 0.73mm/px · z∈[-452,+58]mm · 12 of 114 slices shown, 14 images]
[im 6/114  soft-tissue]
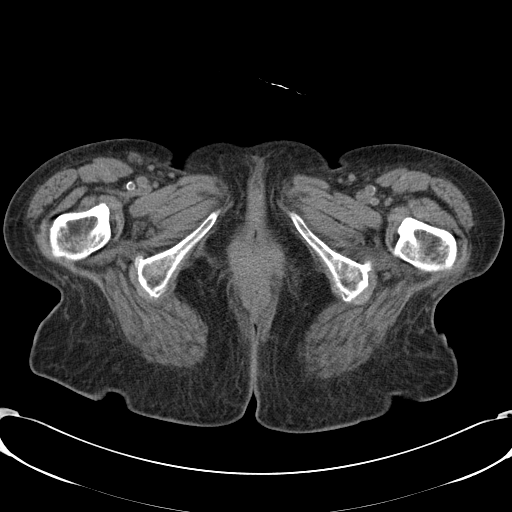
[im 6/114  bone]
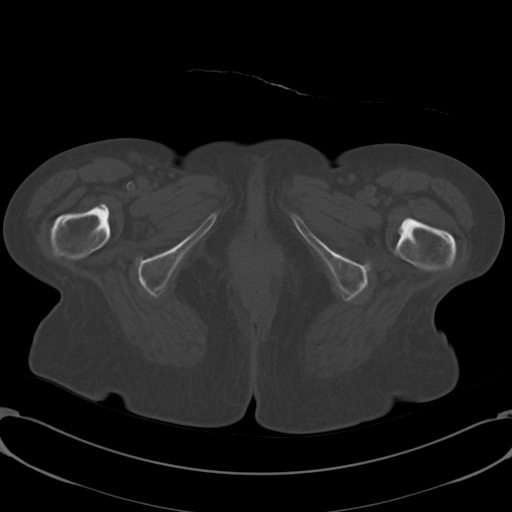
[im 17/114  soft-tissue]
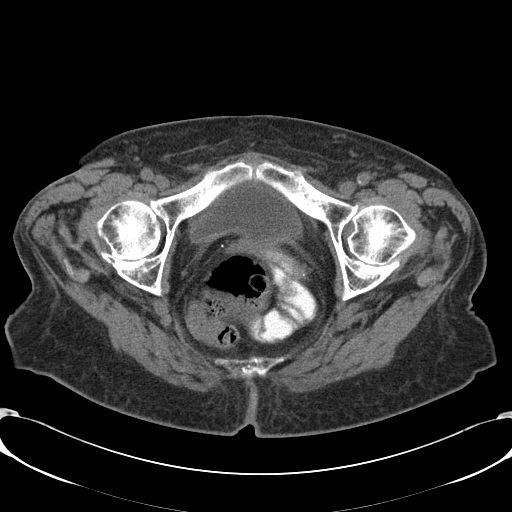
[im 27/114  soft-tissue]
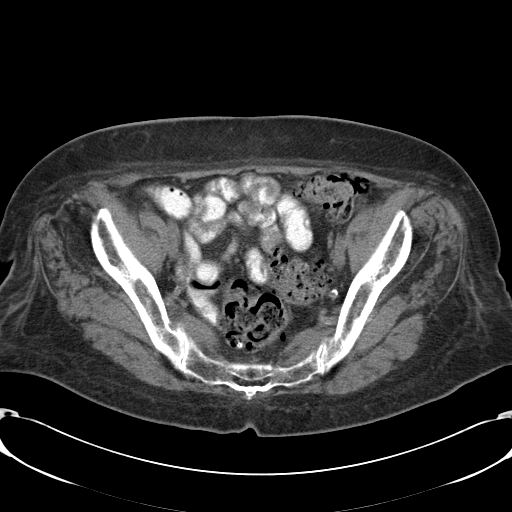
[im 33/114  soft-tissue]
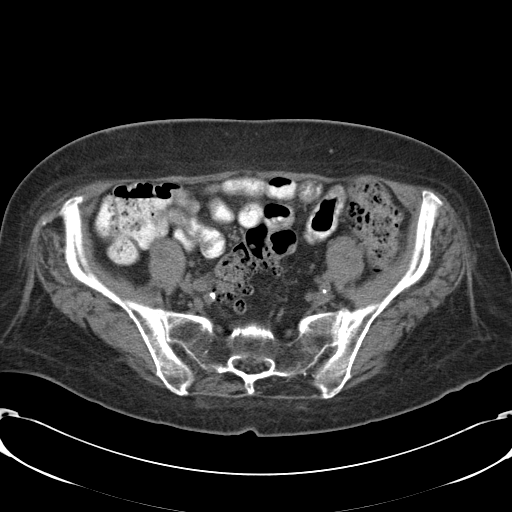
[im 44/114  soft-tissue]
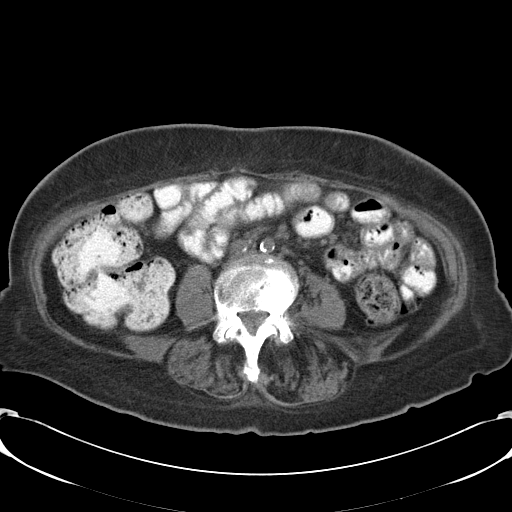
[im 54/114  soft-tissue]
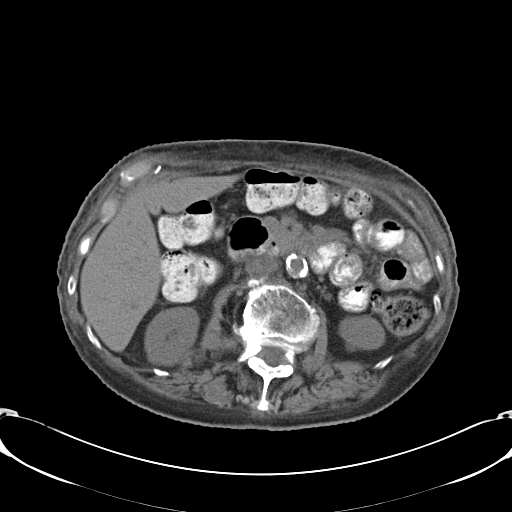
[im 60/114  soft-tissue]
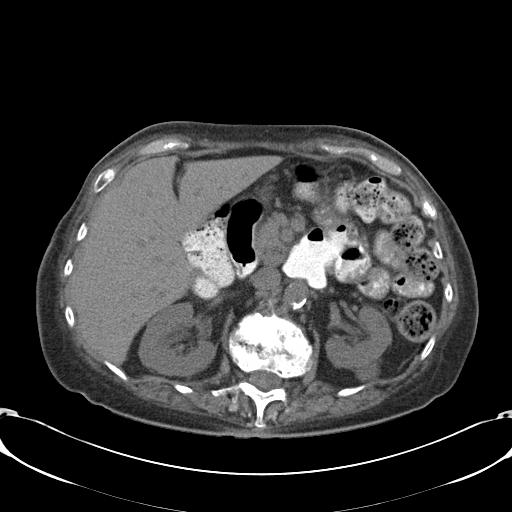
[im 70/114  soft-tissue]
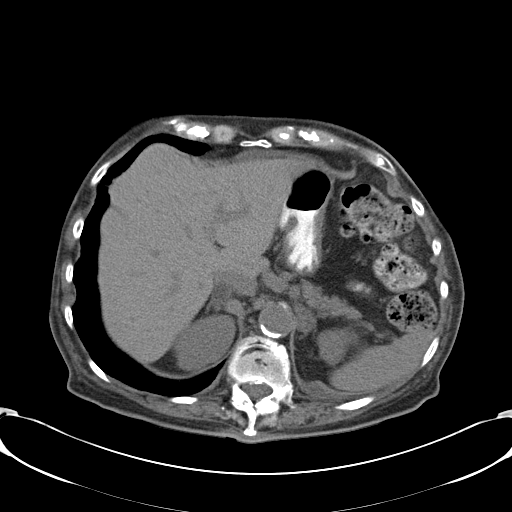
[im 81/114  soft-tissue]
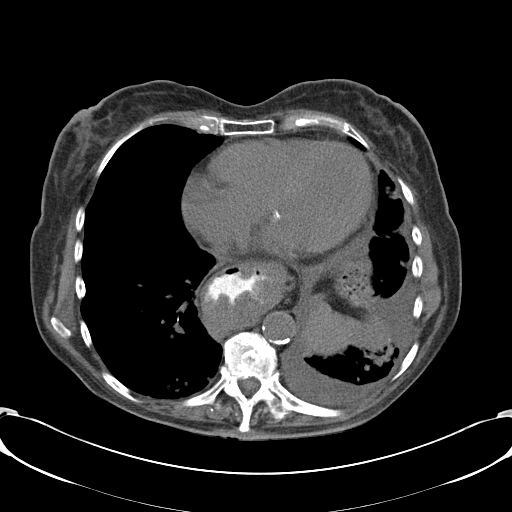
[im 81/114  bone]
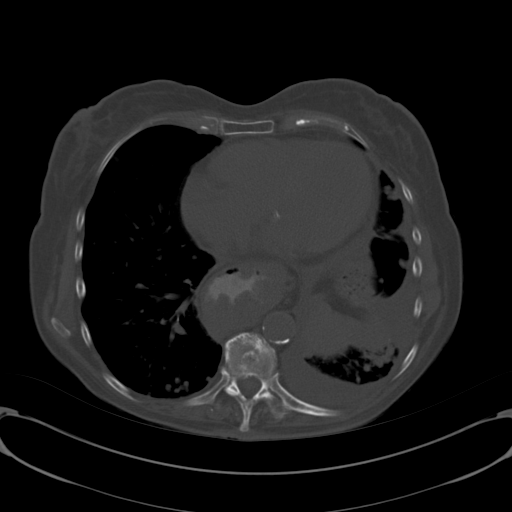
[im 87/114  soft-tissue]
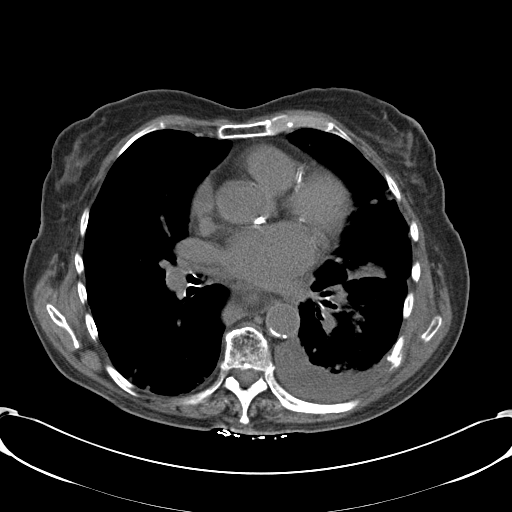
[im 97/114  soft-tissue]
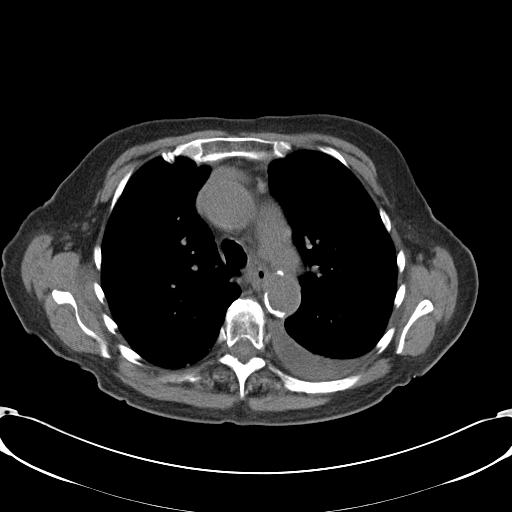
[im 108/114  soft-tissue]
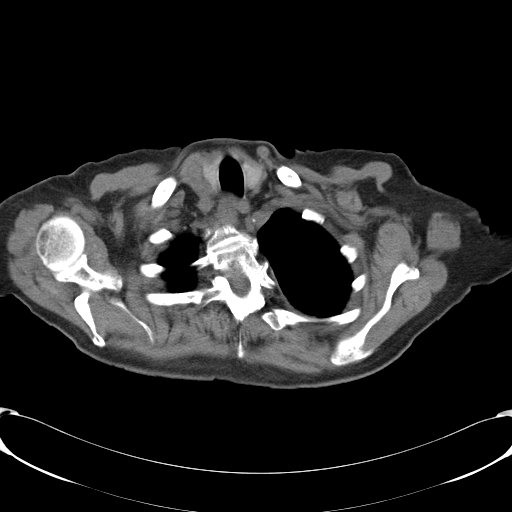

[Series 602: <mpr thick range> · coronal · 1.11mm/px · 3 of 81 slices shown]
[im 27/81  soft-tissue]
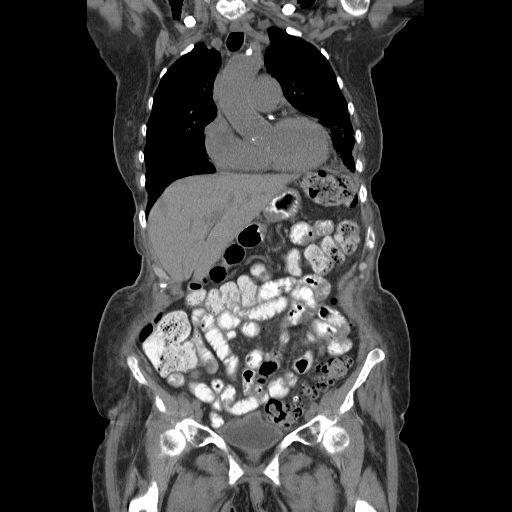
[im 36/81  soft-tissue]
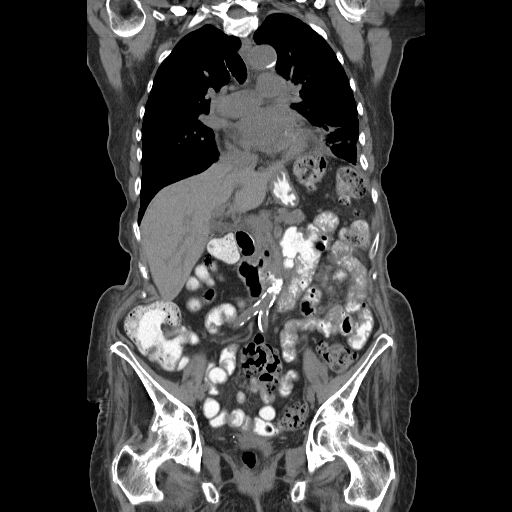
[im 45/81  soft-tissue]
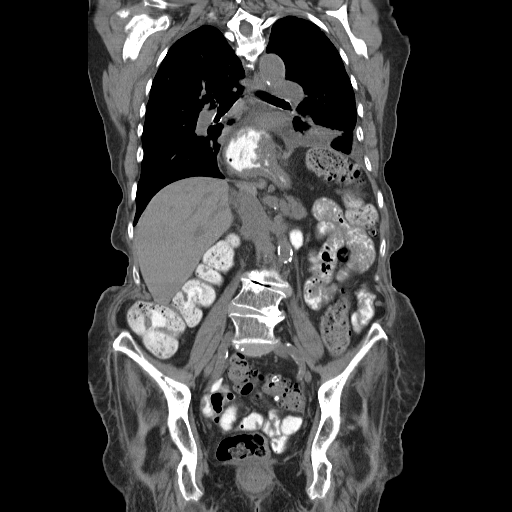

[15 of 46 positions shown; findings below may reference images not displayed]

FINDINGS: CT CHEST FINDINGS

Mediastinum: The heart size appears normal. There is no pericardial
effusion identified. Aortic atherosclerosis noted. Calcification
within the LAD and left circumflex coronary artery noted as well as
left main calcification. The trachea appears patent and is midline.
Moderate to large hiatal hernia is again identified. No mediastinal
or hilar adenopathy identified. There is no enlarged supraclavicular
or axillary lymph nodes. The index sub- carinal lymph node now
measures 7 mm, image 24 series 2. Previously 10 mm.

Lungs/Pleura: There is a moderate left pleural effusion which
appears partially loculated. This is decreased in volume from
previous exam. The left lower lobe lung mass measures 6.4 x 3.9 x
2.8 cm. On the previous exam this measured 6.8 x 4.1 x 3.0 cm.
Numerous pulmonary nodules are identified throughout both lungs.
These are too numerous to count. Subjectively this appears similar
to previous exam.

Musculoskeletal: Scoliosis and degenerative disc disease is again
identified. No aggressive lytic or sclerotic bone lesions
identified.

CT ABDOMEN AND PELVIS FINDINGS

Hepatobiliary: No suspicious liver abnormalities identified.
Previous cholecystectomy. No biliary dilatation.

Pancreas: Normal appearance of the pancreas.

Spleen: The spleen is unremarkable.

Adrenals/Urinary Tract: The low density nodule in the right adrenal
gland measures 1.6 cm and -6 Hounsfield units. This is favored to
represent a benign adenoma, unchanged from previous exam. The left
adrenal gland measures 1.7 cm and 7.6 Hounsfield units. This is also
unchanged from previous exam and likely represents a benign adenoma.
Left renal cysts are again noted. Small nonobstructing left renal
calculus is noted within the upper pole of left kidney. The urinary
bladder appears normal.

Stomach/Bowel: Moderate hiatal hernia. The small bowel loops have a
normal caliber. Normal appearance of the proximal colon. Distal
colonic diverticula noted without acute inflammation. No pathologic
dilatation of the bowel loops identified.

Vascular/Lymphatic: Calcified atherosclerotic disease involves the
abdominal aorta. No aneurysm. No enlarged retroperitoneal or
mesenteric adenopathy. No enlarged pelvic or inguinal lymph nodes.

Reproductive: The uterus is not visualized and may be surgically
absent. No adnexal mass noted.

Other: No free fluid or fluid collections within the abdomen or
pelvis.

Musculoskeletal: Scoliosis and degenerative disc disease noted. The
bones appear osteopenic. No aggressive lytic or sclerotic bone
lesions.
IMPRESSION: 1. There hs been mild decrease in size of dominant left lower lobe
lung mass.
2. Left pleural effusion is again noted. On today's study this
appears partially loculated but is not significantly changed in
volume.
3. Similar appearance of innumerable pulmonary nodules throughout
both lungs.
4. Continued improvement in mediastinal adenopathy with decrease in
size of sub- carinal index lymph node.
5. Hiatal hernia.
6. Stable bilateral adrenal nodules.
7. Aortic atherosclerosis.
8. Thoracic and lumbar scoliosis and spondylosis.

## 2016-11-16 ENCOUNTER — Other Ambulatory Visit: Payer: Self-pay | Admitting: *Deleted

## 2016-11-16 DIAGNOSIS — R0600 Dyspnea, unspecified: Secondary | ICD-10-CM

## 2016-11-16 DIAGNOSIS — C3432 Malignant neoplasm of lower lobe, left bronchus or lung: Secondary | ICD-10-CM

## 2016-11-16 MED ORDER — MORPHINE SULFATE (CONCENTRATE) 10 MG /0.5 ML PO SOLN: 5.0000 mg | ORAL | 0 refills | Status: AC | PRN

## 2016-11-16 NOTE — Addendum Note (Signed)
Addended by: Lucile Crater on: 11/16/2016 12:15 PM   Modules accepted: Orders

## 2016-11-16 NOTE — Telephone Encounter (Signed)
Rx faxed to [facility]. 

## 2016-11-16 NOTE — Telephone Encounter (Signed)
Received call from [place], RN @ [facility] requesting refill of Morphine for pt.  Per Storm[facility] Card, pt has been given  Morphine 20 mg/ml  -  0.5 mg every 1 hour as needed for shortness of breath. Script can be faxed to  [facility]   -   Fax    [phone]. [facility]   Phone    [phone].

## 2016-11-21 ENCOUNTER — Telehealth: Payer: Self-pay | Admitting: Medical Oncology

## 2016-11-21 NOTE — Telephone Encounter (Signed)
Pt has transferred to hospice home.

## 2016-11-23 ENCOUNTER — Telehealth: Payer: Self-pay | Admitting: *Deleted

## 2016-11-25 ENCOUNTER — Telehealth: Payer: Self-pay | Admitting: Medical Oncology

## 2016-11-25 NOTE — Telephone Encounter (Signed)
Pt died December 23, 2016

## 2016-12-11 NOTE — Telephone Encounter (Signed)
Hospice called pt passed today. Message to  HIM to close chart.

## 2016-12-11 DEATH — deceased

## 2017-02-03 IMAGING — DX DG CHEST 1V
2 series · 2 of 2 positions shown · non-contrast
Comparison: CT chest of 11/17/2015 and 08/18/2015

CLINICAL DATA: Left thoracentesis, cough history of lung carcinoma

EXAM:
CHEST 1 VIEW

[chest pa (1 of 2)]
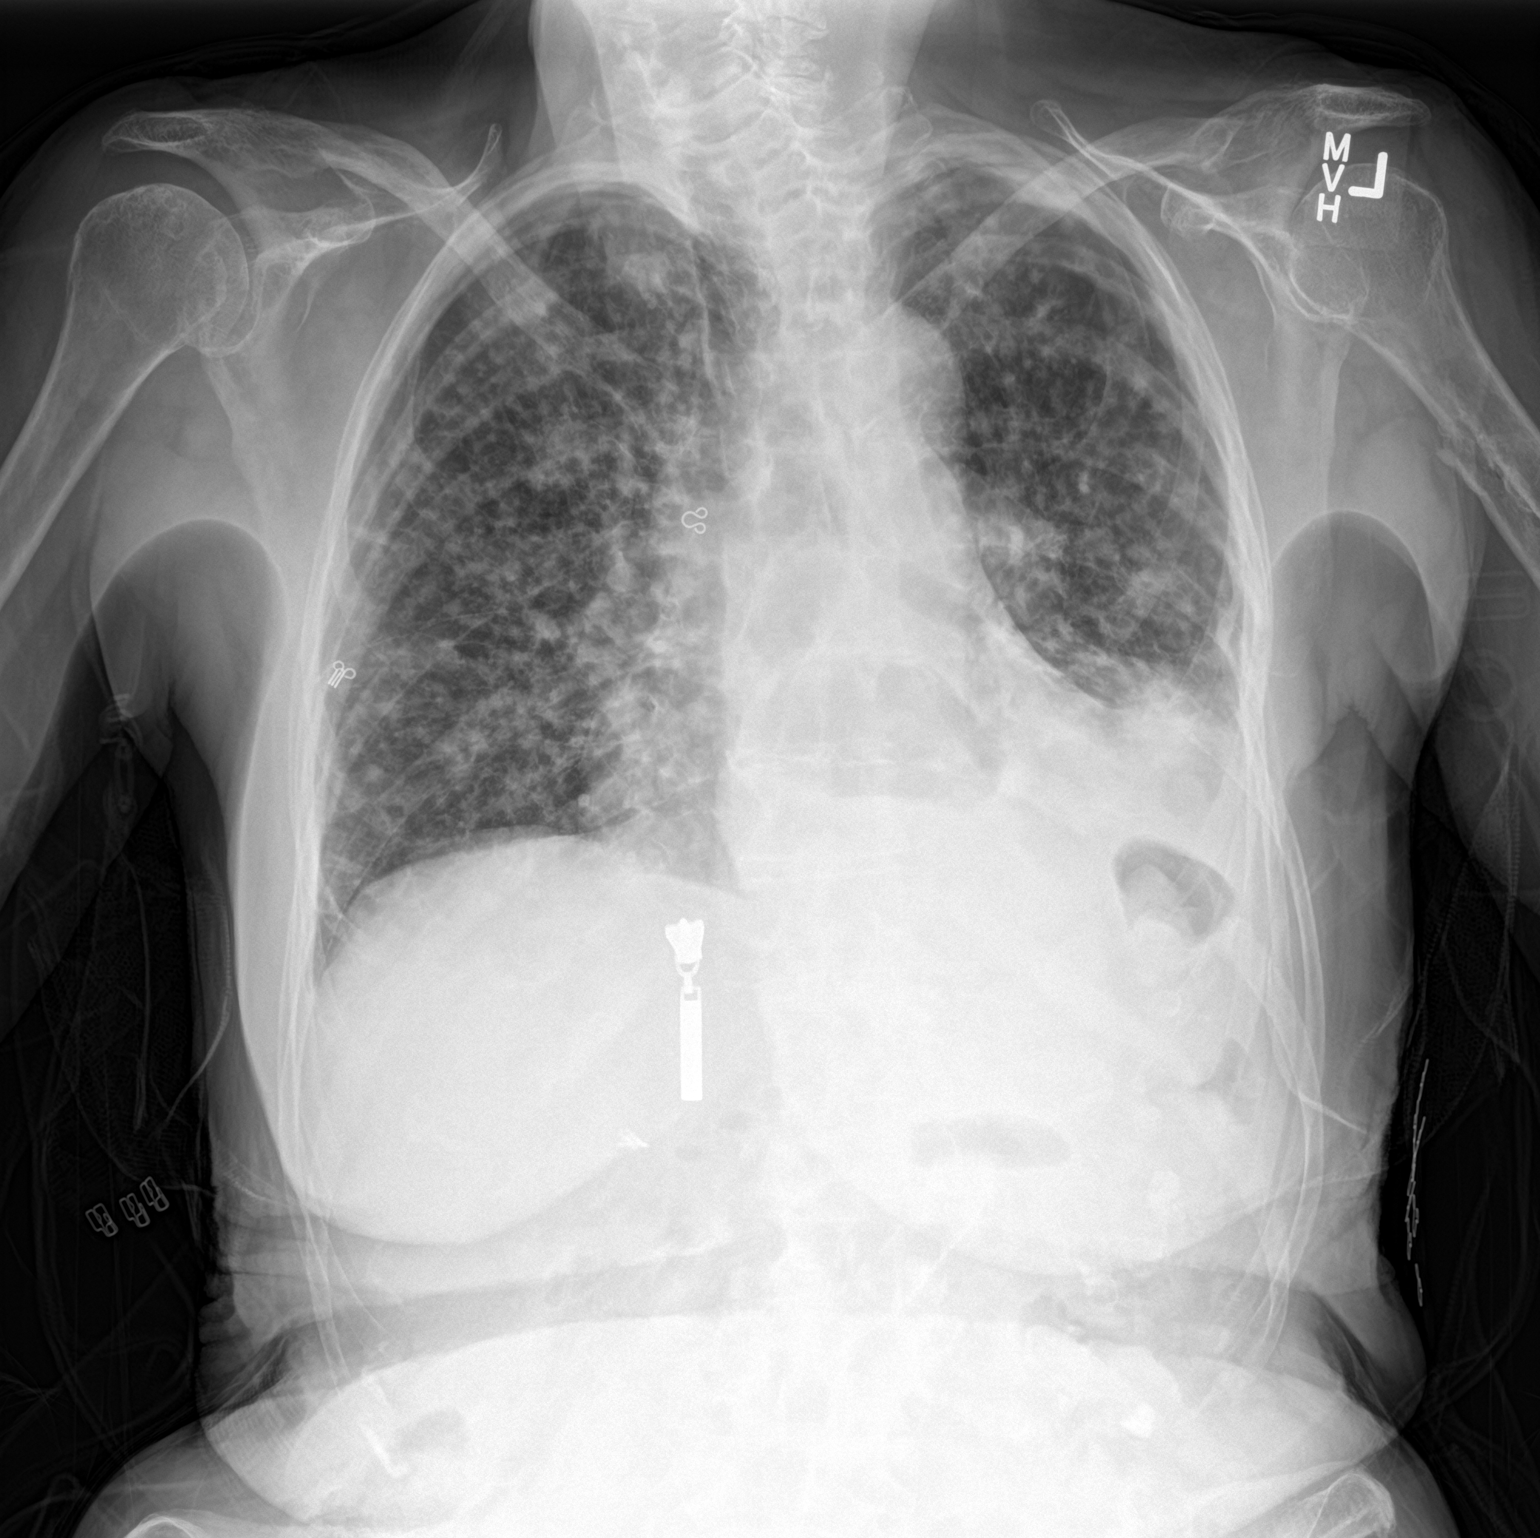

[chest pa (2 of 2)]
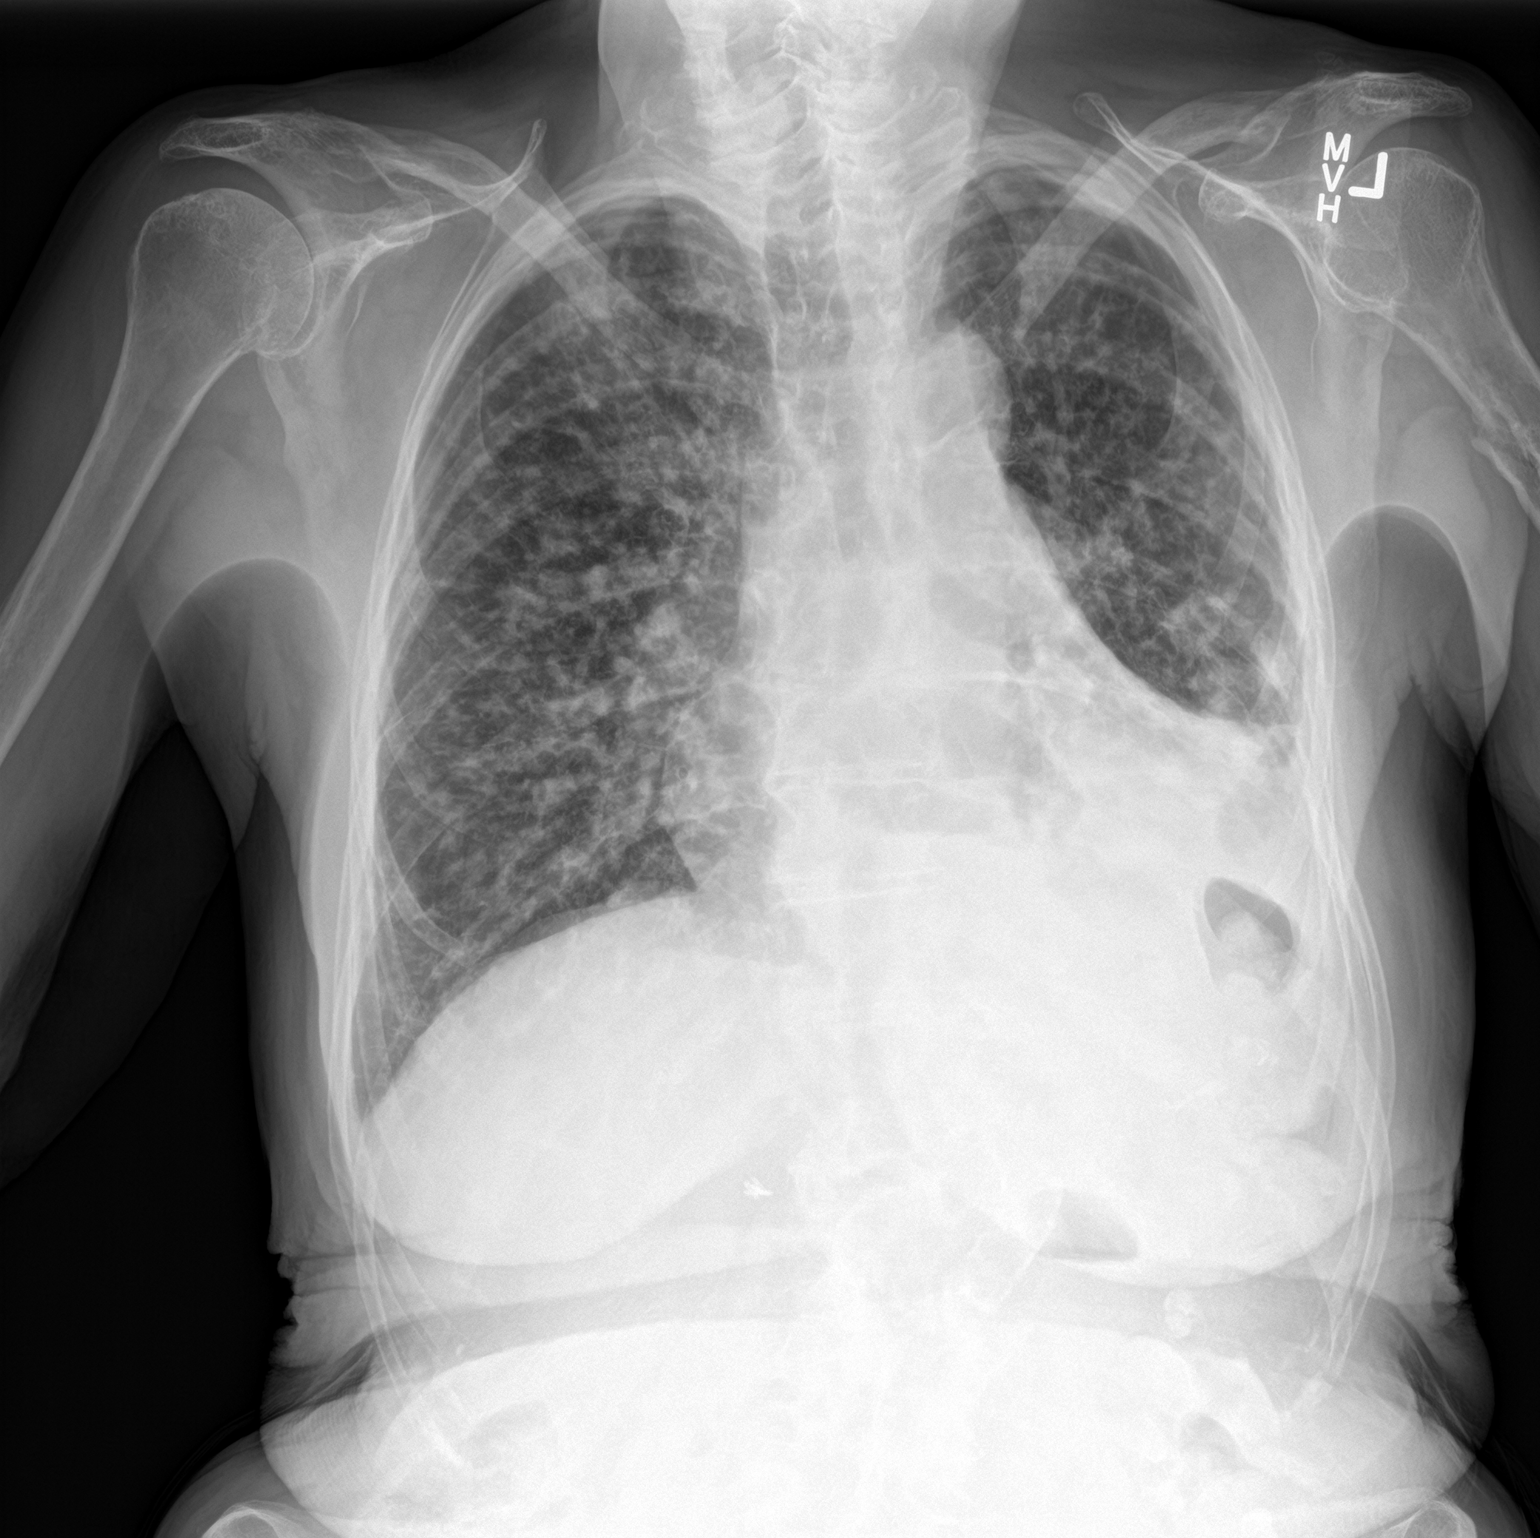

[2 of 2 positions shown; findings below may reference images not displayed]

FINDINGS: After left thoracentesis, no pneumothorax is seen. Opacity remains
at the left lung base consistent with consolidation and mass with
probable small remaining left effusion. Multiple pulmonary nodules
are again noted diffusely throughout the lungs. Stable cardiomegaly
is noted. A moderate size hiatal hernia again is noted.
IMPRESSION: 1. No pneumothorax after left thoracentesis.
2. No change in multiple diffuse pulmonary metastasis.

## 2017-02-03 IMAGING — US US THORACENTESIS ASP PLEURAL SPACE W/IMG GUIDE
1 series · 3 of 3 positions shown · non-contrast
Comparison: none

INDICATION: Stage IB lung cancer in 8812 with findings of metastatic lung cancer
in November 2014. Recent CT scan revealed a left pleural effusion. The
patient has been having some shortness of breath and saw her
oncologist today. A thoracentesis was ordered.

[Series 1: us thoracentesis asp pleural space w/img guide · 0.25mm/px · 3 of 3 slices shown]
[im 1/3]
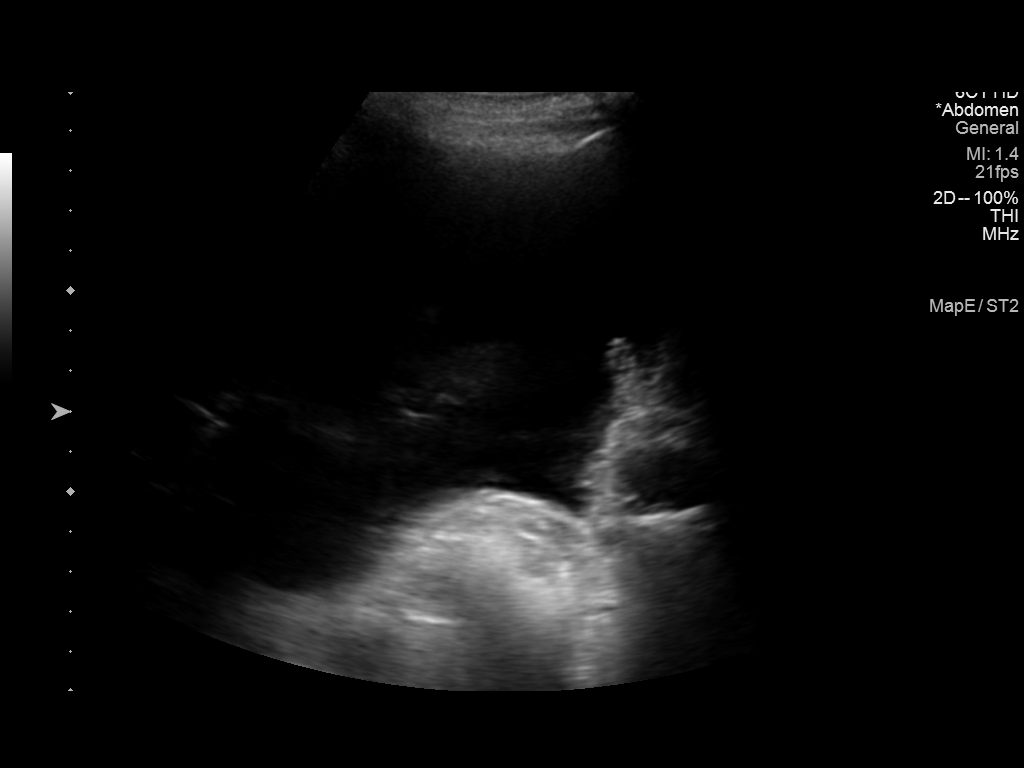
[im 2/3]
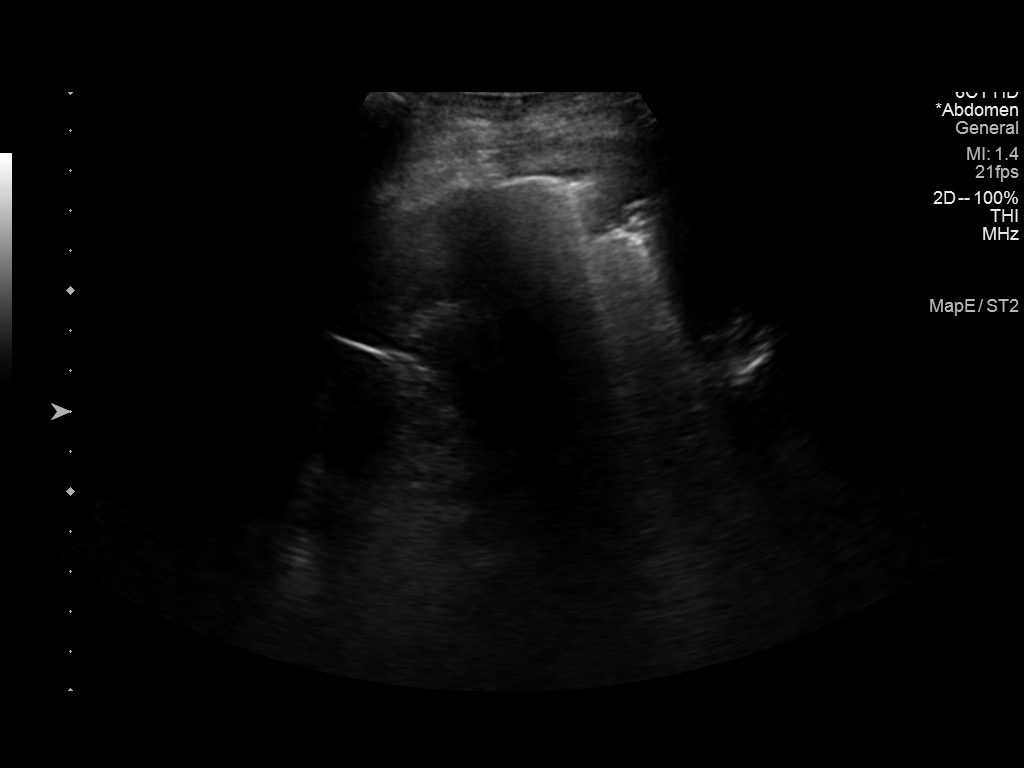
[im 3/3]
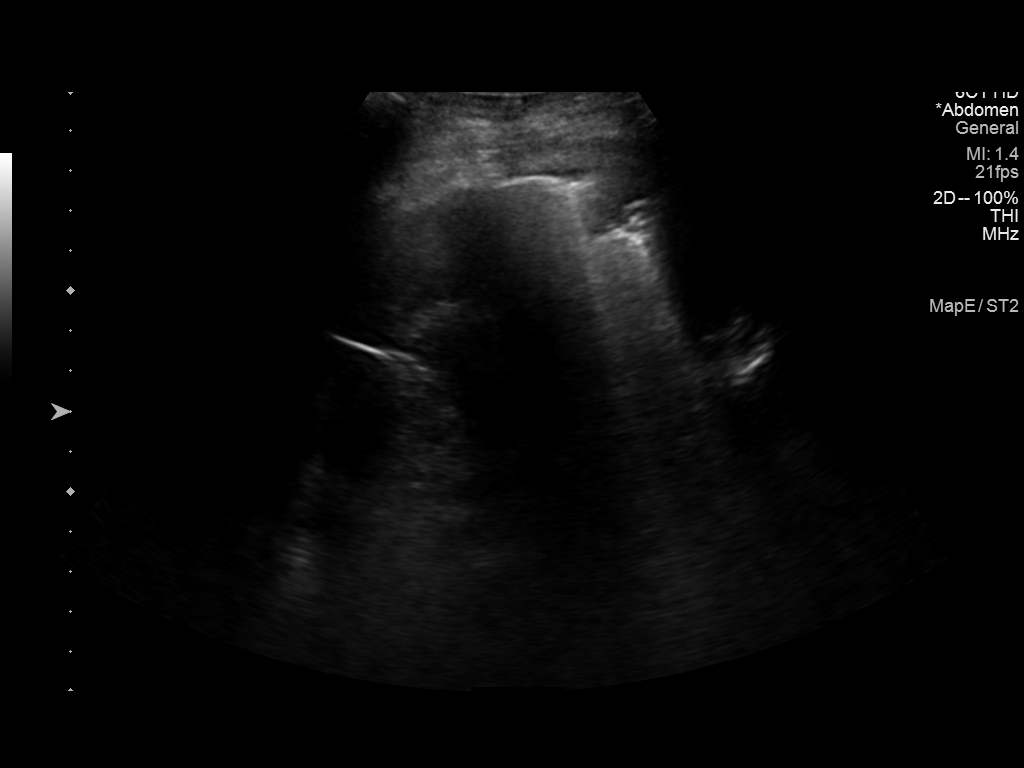

[3 of 3 positions shown; findings below may reference images not displayed]

EXAM:
ULTRASOUND GUIDED THERAPEUTIC THORACENTESIS

MEDICATIONS:
1% lidocaine

COMPLICATIONS:
None immediate.

PROCEDURE:
An ultrasound guided thoracentesis was thoroughly discussed with the
patient and questions answered. The benefits, risks, alternatives
and complications were also discussed. The patient understands and
wishes to proceed with the procedure. Written consent was obtained.

Ultrasound was performed to localize and mark an adequate pocket of
fluid in the left chest. The area was then prepped and draped in the
normal sterile fashion. 1% Lidocaine was used for local anesthesia.
A Safe-T-Centesis catheter was introduced. Thoracentesis was
performed. The catheter was removed and a dressing applied.
FINDINGS: A total of approximately 1.75 L of amber fluid was removed.
IMPRESSION: Successful ultrasound guided therapeutic thoracentesis yielding
L of pleural fluid.
